# Patient Record
Sex: Female | Born: 1950 | Race: Black or African American | Hispanic: No | Marital: Married | Smoking: Never smoker
Health system: Southern US, Community
[De-identification: ages and names within clinical notes are randomized; demographics above are authoritative.]

## PROBLEM LIST (undated history)

## (undated) DIAGNOSIS — I10 Essential (primary) hypertension: Secondary | ICD-10-CM

## (undated) DIAGNOSIS — E785 Hyperlipidemia, unspecified: Secondary | ICD-10-CM

## (undated) DIAGNOSIS — M1990 Unspecified osteoarthritis, unspecified site: Secondary | ICD-10-CM

## (undated) HISTORY — DX: Essential (primary) hypertension: I10

## (undated) HISTORY — PX: COLONOSCOPY: SHX174

## (undated) HISTORY — DX: Hyperlipidemia, unspecified: E78.5

## (undated) HISTORY — DX: Unspecified osteoarthritis, unspecified site: M19.90

---
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
## 2000-02-22 ENCOUNTER — Encounter: Payer: Self-pay | Admitting: Family Medicine

## 2000-02-22 ENCOUNTER — Encounter: Admission: RE | Admit: 2000-02-22 | Discharge: 2000-02-22 | Payer: Self-pay | Admitting: Family Medicine

## 2001-03-04 ENCOUNTER — Encounter: Admission: RE | Admit: 2001-03-04 | Discharge: 2001-03-04 | Payer: Self-pay | Admitting: *Deleted

## 2002-03-13 ENCOUNTER — Encounter: Admission: RE | Admit: 2002-03-13 | Discharge: 2002-03-13 | Payer: Self-pay | Admitting: *Deleted

## 2003-03-25 ENCOUNTER — Encounter: Admission: RE | Admit: 2003-03-25 | Discharge: 2003-03-25 | Payer: Self-pay | Admitting: Geriatric Medicine

## 2003-05-12 LAB — HM COLONOSCOPY

## 2004-03-28 ENCOUNTER — Encounter: Admission: RE | Admit: 2004-03-28 | Discharge: 2004-03-28 | Payer: Self-pay | Admitting: Family Medicine

## 2004-08-10 ENCOUNTER — Ambulatory Visit: Payer: Self-pay | Admitting: Family Medicine

## 2004-11-28 ENCOUNTER — Ambulatory Visit: Payer: Self-pay | Admitting: Family Medicine

## 2005-03-01 ENCOUNTER — Ambulatory Visit: Payer: Self-pay | Admitting: Family Medicine

## 2005-04-03 ENCOUNTER — Encounter: Admission: RE | Admit: 2005-04-03 | Discharge: 2005-04-03 | Payer: Self-pay | Admitting: Family Medicine

## 2005-07-31 ENCOUNTER — Ambulatory Visit: Payer: Self-pay | Admitting: Family Medicine

## 2005-08-07 ENCOUNTER — Ambulatory Visit: Payer: Self-pay | Admitting: Family Medicine

## 2006-04-04 ENCOUNTER — Encounter: Admission: RE | Admit: 2006-04-04 | Discharge: 2006-04-04 | Payer: Self-pay | Admitting: *Deleted

## 2006-10-30 ENCOUNTER — Ambulatory Visit: Payer: Self-pay | Admitting: Family Medicine

## 2006-10-30 LAB — CONVERTED CEMR LAB
ALT: 26 units/L (ref 0–40)
Albumin: 3.9 g/dL (ref 3.5–5.2)
Alkaline Phosphatase: 46 units/L (ref 39–117)
BUN: 9 mg/dL (ref 6–23)
Basophils Absolute: 0 10*3/uL (ref 0.0–0.1)
Basophils Relative: 0.2 % (ref 0.0–1.0)
Bilirubin, Direct: 0.1 mg/dL (ref 0.0–0.3)
Calcium: 9.9 mg/dL (ref 8.4–10.5)
Chloride: 99 meq/L (ref 96–112)
Cholesterol: 180 mg/dL (ref 0–200)
Creatinine, Ser: 0.7 mg/dL (ref 0.4–1.2)
HDL: 42.8 mg/dL (ref >39.0)
Hgb A1c MFr Bld: 5.9 % (ref 4.6–6.0)
LDL Cholesterol: 111 mg/dL — ABNORMAL HIGH (ref 0–99)
MCHC: 33.8 g/dL (ref 30.0–36.0)
Monocytes Relative: 14.3 % — ABNORMAL HIGH (ref 3.0–11.0)
RBC: 4.78 M/uL (ref 3.87–5.11)
RDW: 14.5 % (ref 11.5–14.6)
Total CHOL/HDL Ratio: 4.2
Total Protein: 7.4 g/dL (ref 6.0–8.3)
VLDL: 26 mg/dL (ref 0–40)

## 2006-11-06 ENCOUNTER — Encounter: Payer: Self-pay | Admitting: Family Medicine

## 2006-11-06 ENCOUNTER — Ambulatory Visit: Payer: Self-pay | Admitting: Family Medicine

## 2006-11-06 ENCOUNTER — Other Ambulatory Visit: Admission: RE | Admit: 2006-11-06 | Discharge: 2006-11-06 | Payer: Self-pay | Admitting: Family Medicine

## 2006-11-06 LAB — CONVERTED CEMR LAB: Pap Smear: NORMAL

## 2007-04-08 ENCOUNTER — Encounter: Admission: RE | Admit: 2007-04-08 | Discharge: 2007-04-08 | Payer: Self-pay | Admitting: Family Medicine

## 2007-11-05 ENCOUNTER — Ambulatory Visit: Payer: Self-pay | Admitting: Family Medicine

## 2007-11-05 LAB — CONVERTED CEMR LAB
Bilirubin Urine: NEGATIVE
Glucose, Urine, Semiquant: NEGATIVE
Specific Gravity, Urine: 1.015
Urobilinogen, UA: 0.2
pH: 5

## 2007-11-11 ENCOUNTER — Ambulatory Visit: Payer: Self-pay | Admitting: Family Medicine

## 2007-11-11 ENCOUNTER — Other Ambulatory Visit: Admission: RE | Admit: 2007-11-11 | Discharge: 2007-11-11 | Payer: Self-pay | Admitting: Family Medicine

## 2007-11-11 ENCOUNTER — Encounter: Payer: Self-pay | Admitting: Family Medicine

## 2007-11-11 DIAGNOSIS — M7150 Other bursitis, not elsewhere classified, unspecified site: Secondary | ICD-10-CM

## 2007-11-11 LAB — CONVERTED CEMR LAB
ALT: 23 units/L (ref 0–35)
Albumin: 3.9 g/dL (ref 3.5–5.2)
BUN: 12 mg/dL (ref 6–23)
Basophils Relative: 0.3 % (ref 0.0–1.0)
Bilirubin, Direct: 0.1 mg/dL (ref 0.0–0.3)
CO2: 30 meq/L (ref 19–32)
Calcium: 9.8 mg/dL (ref 8.4–10.5)
Cholesterol: 205 mg/dL (ref 0–200)
Creatinine, Ser: 0.7 mg/dL (ref 0.4–1.2)
Direct LDL: 140.5 mg/dL
GFR calc Af Amer: 112 mL/min
Glucose, Bld: 101 mg/dL — ABNORMAL HIGH (ref 70–99)
HCT: 37.7 % (ref 36.0–46.0)
Hemoglobin: 12.1 g/dL (ref 12.0–15.0)
Lymphocytes Relative: 39.1 % (ref 12.0–46.0)
Monocytes Absolute: 0.7 10*3/uL (ref 0.1–1.0)
Monocytes Relative: 12.8 % — ABNORMAL HIGH (ref 3.0–12.0)
Neutro Abs: 2.4 10*3/uL (ref 1.4–7.7)
RBC: 4.47 M/uL (ref 3.87–5.11)
RDW: 13.7 % (ref 11.5–14.6)
Sodium: 139 meq/L (ref 135–145)
TSH: 2.09 microintl units/mL (ref 0.35–5.50)
Total CHOL/HDL Ratio: 4.8
Total Protein: 6.8 g/dL (ref 6.0–8.3)

## 2007-11-19 ENCOUNTER — Encounter: Payer: Self-pay | Admitting: Family Medicine

## 2008-04-08 ENCOUNTER — Encounter: Admission: RE | Admit: 2008-04-08 | Discharge: 2008-04-08 | Payer: Self-pay | Admitting: Family Medicine

## 2008-11-17 ENCOUNTER — Ambulatory Visit: Payer: Self-pay | Admitting: Family Medicine

## 2008-11-24 ENCOUNTER — Ambulatory Visit: Payer: Self-pay | Admitting: Family Medicine

## 2008-11-24 DIAGNOSIS — N390 Urinary tract infection, site not specified: Secondary | ICD-10-CM | POA: Insufficient documentation

## 2008-11-26 LAB — CONVERTED CEMR LAB
AST: 25 units/L (ref 0–37)
Albumin: 4.2 g/dL (ref 3.5–5.2)
Alkaline Phosphatase: 63 units/L (ref 39–117)
Basophils Relative: 0.1 % (ref 0.0–3.0)
CO2: 32 meq/L (ref 19–32)
Calcium: 9.6 mg/dL (ref 8.4–10.5)
GFR calc non Af Amer: 95.48 mL/min (ref >60)
Glucose, Bld: 92 mg/dL (ref 70–99)
HCT: 37.1 % (ref 36.0–46.0)
Hemoglobin: 12.4 g/dL (ref 12.0–15.0)
Lymphocytes Relative: 38.7 % (ref 12.0–46.0)
Lymphs Abs: 1.7 10*3/uL (ref 0.7–4.0)
MCHC: 33.3 g/dL (ref 30.0–36.0)
Monocytes Relative: 10.6 % (ref 3.0–12.0)
Neutro Abs: 2.3 10*3/uL (ref 1.4–7.7)
Potassium: 4 meq/L (ref 3.5–5.1)
RBC: 4.43 M/uL (ref 3.87–5.11)
Sodium: 141 meq/L (ref 135–145)
TSH: 1.79 microintl units/mL (ref 0.35–5.50)
Total CHOL/HDL Ratio: 5
Total Protein: 7.5 g/dL (ref 6.0–8.3)
Triglycerides: 89 mg/dL (ref 0.0–149.0)

## 2008-11-27 LAB — CONVERTED CEMR LAB
Ketones, urine, test strip: NEGATIVE
Nitrite: NEGATIVE
Specific Gravity, Urine: <1.005
Urobilinogen, UA: 0.2

## 2008-12-06 ENCOUNTER — Ambulatory Visit: Payer: Self-pay | Admitting: Family Medicine

## 2008-12-23 ENCOUNTER — Ambulatory Visit: Payer: Self-pay | Admitting: Family Medicine

## 2008-12-23 LAB — CONVERTED CEMR LAB
Bilirubin Urine: NEGATIVE
Blood in Urine, dipstick: NEGATIVE
Glucose, Urine, Semiquant: NEGATIVE
Ketones, urine, test strip: NEGATIVE
Protein, U semiquant: NEGATIVE
pH: 8.5

## 2009-01-14 ENCOUNTER — Ambulatory Visit: Payer: Self-pay | Admitting: Family Medicine

## 2009-01-14 LAB — CONVERTED CEMR LAB
OCCULT 1: NEGATIVE
OCCULT 2: NEGATIVE
OCCULT 3: NEGATIVE

## 2009-01-19 ENCOUNTER — Encounter: Payer: Self-pay | Admitting: Family Medicine

## 2009-04-12 ENCOUNTER — Encounter: Admission: RE | Admit: 2009-04-12 | Discharge: 2009-04-12 | Payer: Self-pay | Admitting: Family Medicine

## 2009-11-22 ENCOUNTER — Ambulatory Visit: Payer: Self-pay | Admitting: Family Medicine

## 2009-11-29 ENCOUNTER — Other Ambulatory Visit: Admission: RE | Admit: 2009-11-29 | Discharge: 2009-11-29 | Payer: Self-pay | Admitting: Family Medicine

## 2009-11-29 ENCOUNTER — Ambulatory Visit: Payer: Self-pay | Admitting: Family Medicine

## 2009-11-29 DIAGNOSIS — N952 Postmenopausal atrophic vaginitis: Secondary | ICD-10-CM

## 2009-11-29 DIAGNOSIS — M19019 Primary osteoarthritis, unspecified shoulder: Secondary | ICD-10-CM | POA: Insufficient documentation

## 2009-11-29 DIAGNOSIS — M7710 Lateral epicondylitis, unspecified elbow: Secondary | ICD-10-CM | POA: Insufficient documentation

## 2009-11-29 LAB — CONVERTED CEMR LAB
ALT: 21 units/L (ref 0–35)
AST: 25 units/L (ref 0–37)
Albumin: 4.2 g/dL (ref 3.5–5.2)
Eosinophils Relative: 0.8 % (ref 0.0–5.0)
GFR calc non Af Amer: 95.14 mL/min (ref >60)
Glucose, Bld: 96 mg/dL (ref 70–99)
Glucose, Urine, Semiquant: NEGATIVE
HCT: 36.7 % (ref 36.0–46.0)
Hemoglobin: 12.2 g/dL (ref 12.0–15.0)
Lymphs Abs: 1.8 10*3/uL (ref 0.7–4.0)
Monocytes Relative: 11.6 % (ref 3.0–12.0)
Neutro Abs: 2.1 10*3/uL (ref 1.4–7.7)
Potassium: 4.2 meq/L (ref 3.5–5.1)
Protein, U semiquant: NEGATIVE
RDW: 14.8 % — ABNORMAL HIGH (ref 11.5–14.6)
Sodium: 143 meq/L (ref 135–145)
Specific Gravity, Urine: <1.005
TSH: 2.09 microintl units/mL (ref 0.35–5.50)
Total Protein: 7.7 g/dL (ref 6.0–8.3)
VLDL: 20.6 mg/dL (ref 0.0–40.0)
pH: 6.5

## 2009-11-29 LAB — HM PAP SMEAR

## 2009-12-06 ENCOUNTER — Encounter: Payer: Self-pay | Admitting: Family Medicine

## 2009-12-07 ENCOUNTER — Ambulatory Visit: Payer: Self-pay | Admitting: Family Medicine

## 2009-12-22 ENCOUNTER — Encounter: Payer: Self-pay | Admitting: Family Medicine

## 2010-04-13 ENCOUNTER — Encounter: Admission: RE | Admit: 2010-04-13 | Discharge: 2010-04-13 | Payer: Self-pay | Admitting: Family Medicine

## 2010-09-05 NOTE — Letter (Signed)
Summary: Results Follow-up Letter   at [facility]  [address] [place], [place] [address]   Phone: [phone]  Fax: [phone]    12/06/2009  [address] [place], [place]  [address]  Dear [name],   The following are the results of your recent test(s):  Test     Result     Pap Smear    Normal__yes _____     Sincerely,  Dr. Verdie Drown, MD

## 2010-09-05 NOTE — Assessment & Plan Note (Signed)
Summary: cpx/pap/njr   Vital Signs:  Patient profile:   60 year old female Weight:      174 pounds BMI:     28.19 O2 Sat:      98 % Temp:     98.1 degrees F Pulse rate:   95 / minute BP sitting:   144 / 80  (left arm)  Vitals Entered By: Pura Spice, RN (November 29, 2009 10:55 AM)  Contraindications/Deferment of Procedures/Staging:    Test/Procedure: TD vaccine    Reason for deferment: declined  CC: cpx wiht PAP  Is Patient Diabetic? No   History of Present Illness: This 60 year old Afro-American female who is in for a complete physical examination and relates he has been doing her well she exercises on a regular basis and attempts to keep her weight under control with a healthy diet she is on no medications except for 81 mg aspirin. She is lost 6 pounds in the past 6 months She continues to have problems with her right shoulder which I have injected in the past thinking she had subdeltoid bursitis but her problem persist and I am going to refer her to an orthopedist She also has some problem with lateral epicondylitis of the left elbow which we were way too weight lifting and since it is not severe it is time to avoid the exercises that cause discomfort     EKG  Procedure date:  11/29/2009  Findings:      sinus rhythm with rate of:   71  Allergies (verified): No Known Drug Allergies  Past History:  Past Medical History: Last updated: 11/11/2007 Unremarkable  Past Surgical History: Last updated: 11/11/2007 Hysterectomy  Social History: Last updated: 11/24/2008 Married Never Smoked  Risk Factors: Smoking Status: never (11/24/2008)  Review of Systems      See HPI  The patient denies anorexia, fever, weight loss, weight gain, vision loss, decreased hearing, hoarseness, chest pain, syncope, dyspnea on exertion, peripheral edema, prolonged cough, headaches, hemoptysis, abdominal pain, melena, hematochezia, severe indigestion/heartburn, hematuria, incontinence,  genital sores, muscle weakness, suspicious skin lesions, transient blindness, difficulty walking, depression, unusual weight change, abnormal bleeding, enlarged lymph nodes, angioedema, breast masses, and testicular masses.    Physical Exam  General:  Well-developed,well-nourished,in no acute distress; alert,appropriate and cooperative throughout examination Head:  Normocephalic and atraumatic without obvious abnormalities. No apparent alopecia or balding. Eyes:  No corneal or conjunctival inflammation noted. EOMI. Perrla. Funduscopic exam benign, without hemorrhages, exudates or papilledema. Vision grossly normal. Ears:  External ear exam shows no significant lesions or deformities.  Otoscopic examination reveals clear canals, tympanic membranes are intact bilaterally without bulging, retraction, inflammation or discharge. Hearing is grossly normal bilaterally. Nose:  External nasal examination shows no deformity or inflammation. Nasal mucosa are pink and moist without lesions or exudates. Mouth:  Oral mucosa and oropharynx without lesions or exudates.  Teeth in good repair. Neck:  No deformities, masses, or tenderness noted. Chest Wall:  No deformities, masses, or tenderness noted. Breasts:  No mass, nodules, thickening, tenderness, bulging, retraction, inflamation, nipple discharge or skin changes noted.   Lungs:  Normal respiratory effort, chest expands symmetrically. Lungs are clear to auscultation, no crackles or wheezes. Heart:  Normal rate and regular rhythm. S1 and S2 normal without gallop, murmur, click, rub or other extra sounds. Abdomen:  Bowel sounds positive,abdomen soft and non-tender without masses, organomegaly or hernias noted. Rectal:  No external abnormalities noted. Normal sphincter tone. No rectal masses or tenderness. Genitalia:  absent uterus  atrophic nasal mucosa, dry Labs rebound Msk:  pain on movement right shoulder tenderness over the subdeltoid bursa pain on abduction  and abduction and hyperextension minimal tenderness over the left lateral elbow or lateral epicondylitis present Pulses:  R and L carotid,radial,femoral,dorsalis pedis and posterior tibial pulses are full and equal bilaterally Extremities:  No clubbing, cyanosis, edema, or deformity noted with normal full range of motion of all joints.   Neurologic:  No cranial nerve deficits noted. Station and gait are normal. Plantar reflexes are down-going bilaterally. DTRs are symmetrical throughout. Sensory, motor and coordinative functions appear intact.   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 1 once daily Her updated medication list for this problem includes:    Diclofenac Sodium 75 Mg Tbec (Diclofenac sodium) .Marland Kitchen... 1 two times a day pc for shoulder and elbow  Orders: Orthopedic Surgeon Referral (Ortho Surgeon)  Complete Medication List: 1)  Diclofenac Sodium 75 Mg Tbec (Diclofenac sodium) .Marland Kitchen.. 1 two times a day pc for shoulder and elbow  Patient Instructions: 1)  healthy female with arthritis her subdeltoid bursitis of the right shoulder and lateral epicondylitis of the left elbow. 2)  He also have atrophic vaginal mucosa which can be treated if you desire 3)  [facility] the prescription to 4 diclofenac 75 mg twice daily for shoulder and elbow 4)  Also to make an appointment with an orthopedist Prescriptions: DICLOFENAC SODIUM 75 MG TBEC (DICLOFENAC SODIUM) 1 two  times a day pc for shoulder and elbow  #60 x 11   Entered and Authorized by:   Judithann Sheen MD   Signed by:   Judithann Sheen MD on 11/29/2009   Method used:   Print then Give to Patient   RxID:   [id number]

## 2010-09-05 NOTE — Letter (Signed)
Summary: Generic Letter  [place] [facility]  [address] [place], [place] [address]   Phone: [phone]  Fax: [phone]    12/22/2009  Kiara Holt [address] [place], [place]  [address]  Dear Ms. [name],   Hemocult cards were all negative.         Sincerely,   Dr Gwenyth Bender Stafford,MD

## 2010-12-05 ENCOUNTER — Other Ambulatory Visit (INDEPENDENT_AMBULATORY_CARE_PROVIDER_SITE_OTHER): Payer: 59 | Admitting: Family Medicine

## 2010-12-05 DIAGNOSIS — Z0000 Encounter for general adult medical examination without abnormal findings: Secondary | ICD-10-CM

## 2010-12-05 LAB — LIPID PANEL
Cholesterol: 185 mg/dL (ref 0–200)
HDL: 42.8 mg/dL (ref >39.00)
Total CHOL/HDL Ratio: 4
Triglycerides: 112 mg/dL (ref 0.0–149.0)

## 2010-12-05 LAB — BASIC METABOLIC PANEL
BUN: 7 mg/dL (ref 6–23)
CO2: 32 mEq/L (ref 19–32)
Chloride: 101 mEq/L (ref 96–112)
Creatinine, Ser: 0.8 mg/dL (ref 0.4–1.2)

## 2010-12-05 LAB — POCT URINALYSIS DIPSTICK
Bilirubin, UA: NEGATIVE
Nitrite, UA: NEGATIVE
Spec Grav, UA: 1.015
pH, UA: 7

## 2010-12-05 LAB — HEPATIC FUNCTION PANEL
Bilirubin, Direct: 0.1 mg/dL (ref 0.0–0.3)
Total Bilirubin: 0.6 mg/dL (ref 0.3–1.2)
Total Protein: 6.7 g/dL (ref 6.0–8.3)

## 2010-12-05 LAB — CBC WITH DIFFERENTIAL/PLATELET
Basophils Relative: 0.4 % (ref 0.0–3.0)
Eosinophils Absolute: 0 10*3/uL (ref 0.0–0.7)
MCHC: 32.8 g/dL (ref 30.0–36.0)
MCV: 84.8 fl (ref 78.0–100.0)
Monocytes Absolute: 0.6 10*3/uL (ref 0.1–1.0)
Neutrophils Relative %: 53.1 % (ref 43.0–77.0)
Platelets: 182 10*3/uL (ref 150.0–400.0)

## 2010-12-14 ENCOUNTER — Ambulatory Visit (INDEPENDENT_AMBULATORY_CARE_PROVIDER_SITE_OTHER): Payer: 59 | Admitting: Family Medicine

## 2010-12-14 ENCOUNTER — Other Ambulatory Visit (HOSPITAL_COMMUNITY)
Admission: RE | Admit: 2010-12-14 | Discharge: 2010-12-14 | Disposition: A | Discharge status: Home / Self Care | Payer: 59 | Source: Ambulatory Visit | Attending: Family Medicine | Admitting: Family Medicine

## 2010-12-14 ENCOUNTER — Encounter: Payer: Self-pay | Admitting: Family Medicine

## 2010-12-14 VITALS — BP 118/78 | HR 100 | Temp 98.3°F | Ht 66.0 in | Wt 175.0 lb

## 2010-12-14 DIAGNOSIS — M19011 Primary osteoarthritis, right shoulder: Secondary | ICD-10-CM

## 2010-12-14 DIAGNOSIS — Z01419 Encounter for gynecological examination (general) (routine) without abnormal findings: Secondary | ICD-10-CM | POA: Insufficient documentation

## 2010-12-14 DIAGNOSIS — M19019 Primary osteoarthritis, unspecified shoulder: Secondary | ICD-10-CM

## 2010-12-14 DIAGNOSIS — Z0000 Encounter for general adult medical examination without abnormal findings: Secondary | ICD-10-CM

## 2010-12-14 NOTE — Patient Instructions (Addendum)
You are doing great, continue Aleve for your shoulder Continue to exercise You are a healthy lady Reviewed your lab studies and are normal

## 2011-01-03 NOTE — Progress Notes (Signed)
Quick Note:  Pt is aware. ______ 

## 2011-01-08 NOTE — Progress Notes (Signed)
  Subjective:    Patient ID: Kiara Holt, female    DOB: 1950-09-26, 60 y.o.   MRN: [id number] 60 year old black married female was in for a yearly physical examination he relates she has no complaints. She continues to have occasional pain in the right shoulder region but not sufficient to take medications she does not take any medications at this time she exercises on a regular basis no sexual activity and no other complaintsHPI    Review of Systems  Constitutional: Negative.   HENT: Negative.   Eyes: Negative.   Respiratory: Negative.   Cardiovascular: Negative.   Genitourinary: Negative.   Musculoskeletal: Negative.   Skin: Negative.   Neurological: Negative.   Hematological: Negative.   Psychiatric/Behavioral: Negative.        Objective:   Physical Exam The patient is a pleasant black female who is in no distress cooperative and pleasant HEENT negative carotid pulses good thyroid is normal Lungs clear to palpation percussion auscultation no rales no dullness no wheezing Heart no cardiomegaly heart sounds are good without murmurs peripheral pulses good and equal bilaterally regular cardiac rhythm Breasts full firm no masses no tenderness nipples normal axilla clear no lymphadenopathy Abdomen liver spleen kidneys nonpalpable no masses felt bowel sounds normal Pelvic examination external introitus normal but tight non elastic vaginal mucosa slightly dry uterus absent Rectal examination negative adnexal areas negative Extremities negative examination right shoulder negative Skin no lesions present Neurological examination normal no abnormalities reflexes normal equal bilaterally       Assessment & Plan:  Patient is a healthy female who needs no medications, is there active physically in good emotional Reviewed lab studies and are very good

## 2011-03-14 ENCOUNTER — Other Ambulatory Visit: Payer: Self-pay | Admitting: Family Medicine

## 2011-03-14 DIAGNOSIS — Z1231 Encounter for screening mammogram for malignant neoplasm of breast: Secondary | ICD-10-CM

## 2011-04-16 ENCOUNTER — Ambulatory Visit
Admission: RE | Admit: 2011-04-16 | Discharge: 2011-04-16 | Disposition: A | Discharge status: Home / Self Care | Payer: 59 | Source: Ambulatory Visit | Attending: Family Medicine | Admitting: Family Medicine

## 2011-04-16 DIAGNOSIS — Z1231 Encounter for screening mammogram for malignant neoplasm of breast: Secondary | ICD-10-CM

## 2011-08-24 ENCOUNTER — Other Ambulatory Visit (INDEPENDENT_AMBULATORY_CARE_PROVIDER_SITE_OTHER): Payer: Self-pay | Admitting: General Surgery

## 2011-10-31 ENCOUNTER — Encounter: Payer: Self-pay | Admitting: Internal Medicine

## 2011-10-31 ENCOUNTER — Ambulatory Visit (INDEPENDENT_AMBULATORY_CARE_PROVIDER_SITE_OTHER): Payer: 59 | Admitting: Internal Medicine

## 2011-10-31 VITALS — BP 148/82 | HR 92 | Temp 98.2°F | Wt 164.0 lb

## 2011-10-31 DIAGNOSIS — Z0000 Encounter for general adult medical examination without abnormal findings: Secondary | ICD-10-CM

## 2011-10-31 DIAGNOSIS — R7309 Other abnormal glucose: Secondary | ICD-10-CM

## 2011-10-31 DIAGNOSIS — R7303 Prediabetes: Secondary | ICD-10-CM | POA: Insufficient documentation

## 2011-10-31 NOTE — Patient Instructions (Signed)
Preventive Care for Adults, Female A healthy lifestyle and preventive care can promote health and wellness. Preventive health guidelines for women include the following key practices.  A routine yearly physical is a good way to check with your caregiver about your health and preventive screening. It is a chance to share any concerns and updates on your health, and to receive a thorough exam.   Visit your dentist for a routine exam and preventive care every 6 months. Brush your teeth twice a day and floss once a day. Good oral hygiene prevents tooth decay and gum disease.   The frequency of eye exams is based on your age, health, family medical history, use of contact lenses, and other factors. Follow your caregiver's recommendations for frequency of eye exams.   Eat a healthy diet. Foods like vegetables, fruits, whole grains, low-fat dairy products, and lean protein foods contain the nutrients you need without too many calories. Decrease your intake of foods high in solid fats, added sugars, and salt. Eat the right amount of calories for you.Get information about a proper diet from your caregiver, if necessary.   Regular physical exercise is one of the most important things you can do for your health. Most adults should get at least 150 minutes of moderate-intensity exercise (any activity that increases your heart rate and causes you to sweat) each week. In addition, most adults need muscle-strengthening exercises on 2 or more days a week.   Maintain a healthy weight. The body mass index (BMI) is a screening tool to identify possible weight problems. It provides an estimate of body fat based on height and weight. Your caregiver can help determine your BMI, and can help you achieve or maintain a healthy weight.For adults 20 years and older:   A BMI below 18.5 is considered underweight.   A BMI of 18.5 to 24.9 is normal.   A BMI of 25 to 29.9 is considered overweight.   A BMI of 30 and above is  considered obese.   Maintain normal blood lipids and cholesterol levels by exercising and minimizing your intake of saturated fat. Eat a balanced diet with plenty of fruit and vegetables. Blood tests for lipids and cholesterol should begin at age 20 and be repeated every 5 years. If your lipid or cholesterol levels are high, you are over 50, or you are at high risk for heart disease, you may need your cholesterol levels checked more frequently.Ongoing high lipid and cholesterol levels should be treated with medicines if diet and exercise are not effective.   If you smoke, find out from your caregiver how to quit. If you do not use tobacco, do not start.   If you are pregnant, do not drink alcohol. If you are breastfeeding, be very cautious about drinking alcohol. If you are not pregnant and choose to drink alcohol, do not exceed 1 drink per day. One drink is considered to be 12 ounces (355 mL) of beer, 5 ounces (148 mL) of wine, or 1.5 ounces (44 mL) of liquor.   Avoid use of street drugs. Do not share needles with anyone. Ask for help if you need support or instructions about stopping the use of drugs.   High blood pressure causes heart disease and increases the risk of stroke. Your blood pressure should be checked at least every 1 to 2 years. Ongoing high blood pressure should be treated with medicines if weight loss and exercise are not effective.   If you are 55 to 59   years old, ask your caregiver if you should take aspirin to prevent strokes.   Diabetes screening involves taking a blood sample to check your fasting blood sugar level. This should be done once every 3 years, after age 45, if you are within normal weight and without risk factors for diabetes. Testing should be considered at a younger age or be carried out more frequently if you are overweight and have at least 1 risk factor for diabetes.   Breast cancer screening is essential preventive care for women. You should practice "breast  self-awareness." This means understanding the normal appearance and feel of your breasts and may include breast self-examination. Any changes detected, no matter how small, should be reported to a caregiver. Women in their 20s and 30s should have a clinical breast exam (CBE) by a caregiver as part of a regular health exam every 1 to 3 years. After age 40, women should have a CBE every year. Starting at age 40, women should consider having a mammography (breast X-ray test) every year. Women who have a family history of breast cancer should talk to their caregiver about genetic screening. Women at a high risk of breast cancer should talk to their caregivers about having magnetic resonance imaging (MRI) and a mammography every year.   The Pap test is a screening test for cervical cancer. A Pap test can show cell changes on the cervix that might become cervical cancer if left untreated. A Pap test is a procedure in which cells are obtained and examined from the lower end of the uterus (cervix).   Women should have a Pap test starting at age 21.   Between ages 21 and 29, Pap tests should be repeated every 2 years.   Beginning at age 30, you should have a Pap test every 3 years as long as the past 3 Pap tests have been normal.   Some women have medical problems that increase the chance of getting cervical cancer. Talk to your caregiver about these problems. It is especially important to talk to your caregiver if a new problem develops soon after your last Pap test. In these cases, your caregiver may recommend more frequent screening and Pap tests.   The above recommendations are the same for women who have or have not gotten the vaccine for human papillomavirus (HPV).   If you had a hysterectomy for a problem that was not cancer or a condition that could lead to cancer, then you no longer need Pap tests. Even if you no longer need a Pap test, a regular exam is a good idea to make sure no other problems are  starting.   If you are between ages 65 and 70, and you have had normal Pap tests going back 10 years, you no longer need Pap tests. Even if you no longer need a Pap test, a regular exam is a good idea to make sure no other problems are starting.   If you have had past treatment for cervical cancer or a condition that could lead to cancer, you need Pap tests and screening for cancer for at least 20 years after your treatment.   If Pap tests have been discontinued, risk factors (such as a new sexual partner) need to be reassessed to determine if screening should be resumed.   The HPV test is an additional test that may be used for cervical cancer screening. The HPV test looks for the virus that can cause the cell changes on the cervix.   The cells collected during the Pap test can be tested for HPV. The HPV test could be used to screen women aged 30 years and older, and should be used in women of any age who have unclear Pap test results. After the age of 30, women should have HPV testing at the same frequency as a Pap test.   Colorectal cancer can be detected and often prevented. Most routine colorectal cancer screening begins at the age of 50 and continues through age 75. However, your caregiver may recommend screening at an earlier age if you have risk factors for colon cancer. On a yearly basis, your caregiver may provide home test kits to check for hidden blood in the stool. Use of a small camera at the end of a tube, to directly examine the colon (sigmoidoscopy or colonoscopy), can detect the earliest forms of colorectal cancer. Talk to your caregiver about this at age 50, when routine screening begins. Direct examination of the colon should be repeated every 5 to 10 years through age 75, unless early forms of pre-cancerous polyps or small growths are found.   Hepatitis C blood testing is recommended for all people born from 1945 through 1965 and any individual with known risks for hepatitis C.    Practice safe sex. Use condoms and avoid high-risk sexual practices to reduce the spread of sexually transmitted infections (STIs). STIs include gonorrhea, chlamydia, syphilis, trichomonas, herpes, HPV, and human immunodeficiency virus (HIV). Herpes, HIV, and HPV are viral illnesses that have no cure. They can result in disability, cancer, and death. Sexually active women aged 25 and younger should be checked for chlamydia. Older women with new or multiple partners should also be tested for chlamydia. Testing for other STIs is recommended if you are sexually active and at increased risk.   Osteoporosis is a disease in which the bones lose minerals and strength with aging. This can result in serious bone fractures. The risk of osteoporosis can be identified using a bone density scan. Women ages 65 and over and women at risk for fractures or osteoporosis should discuss screening with their caregivers. Ask your caregiver whether you should take a calcium supplement or vitamin D to reduce the rate of osteoporosis.   Menopause can be associated with physical symptoms and risks. Hormone replacement therapy is available to decrease symptoms and risks. You should talk to your caregiver about whether hormone replacement therapy is right for you.   Use sunscreen with sun protection factor (SPF) of 30 or more. Apply sunscreen liberally and repeatedly throughout the day. You should seek shade when your shadow is shorter than you. Protect yourself by wearing long sleeves, pants, a wide-brimmed hat, and sunglasses year round, whenever you are outdoors.   Once a month, do a whole body skin exam, using a mirror to look at the skin on your back. Notify your caregiver of new moles, moles that have irregular borders, moles that are larger than a pencil eraser, or moles that have changed in shape or color.   Stay current with required immunizations.   Influenza. You need a dose every fall (or winter). The composition of  the flu vaccine changes each year, so being vaccinated once is not enough.   Pneumococcal polysaccharide. You need 1 to 2 doses if you smoke cigarettes or if you have certain chronic medical conditions. You need 1 dose at age 65 (or older) if you have never been vaccinated.   Tetanus, diphtheria, pertussis (Tdap, Td). Get 1 dose of   Tdap vaccine if you are younger than age 65, are over 65 and have contact with an infant, are a healthcare worker, are pregnant, or simply want to be protected from whooping cough. After that, you need a Td booster dose every 10 years. Consult your caregiver if you have not had at least 3 tetanus and diphtheria-containing shots sometime in your life or have a deep or dirty wound.   HPV. You need this vaccine if you are a woman age 26 or younger. The vaccine is given in 3 doses over 6 months.   Measles, mumps, rubella (MMR). You need at least 1 dose of MMR if you were born in 1957 or later. You may also need a second dose.   Meningococcal. If you are age 19 to 21 and a first-year college student living in a residence hall, or have one of several medical conditions, you need to get vaccinated against meningococcal disease. You may also need additional booster doses.   Zoster (shingles). If you are age 60 or older, you should get this vaccine.   Varicella (chickenpox). If you have never had chickenpox or you were vaccinated but received only 1 dose, talk to your caregiver to find out if you need this vaccine.   Hepatitis A. You need this vaccine if you have a specific risk factor for hepatitis A virus infection or you simply wish to be protected from this disease. The vaccine is usually given as 2 doses, 6 to 18 months apart.   Hepatitis B. You need this vaccine if you have a specific risk factor for hepatitis B virus infection or you simply wish to be protected from this disease. The vaccine is given in 3 doses, usually over 6 months.  Preventive Services /  Frequency Ages 19 to 39  Blood pressure check.** / Every 1 to 2 years.   Lipid and cholesterol check.** / Every 5 years beginning at age 20.   Clinical breast exam.** / Every 3 years for women in their 20s and 30s.   Pap test.** / Every 2 years from ages 21 through 29. Every 3 years starting at age 30 through age 65 or 70 with a history of 3 consecutive normal Pap tests.   HPV screening.** / Every 3 years from ages 30 through ages 65 to 70 with a history of 3 consecutive normal Pap tests.   Hepatitis C blood test.** / For any individual with known risks for hepatitis C.   Skin self-exam. / Monthly.   Influenza immunization.** / Every year.   Pneumococcal polysaccharide immunization.** / 1 to 2 doses if you smoke cigarettes or if you have certain chronic medical conditions.   Tetanus, diphtheria, pertussis (Tdap, Td) immunization. / A one-time dose of Tdap vaccine. After that, you need a Td booster dose every 10 years.   HPV immunization. / 3 doses over 6 months, if you are 26 and younger.   Measles, mumps, rubella (MMR) immunization. / You need at least 1 dose of MMR if you were born in 1957 or later. You may also need a second dose.   Meningococcal immunization. / 1 dose if you are age 19 to 21 and a first-year college student living in a residence hall, or have one of several medical conditions, you need to get vaccinated against meningococcal disease. You may also need additional booster doses.   Varicella immunization.** / Consult your caregiver.   Hepatitis A immunization.** / Consult your caregiver. 2 doses, 6 to 18 months   apart.   Hepatitis B immunization.** / Consult your caregiver. 3 doses usually over 6 months.  Ages 40 to 64  Blood pressure check.** / Every 1 to 2 years.   Lipid and cholesterol check.** / Every 5 years beginning at age 20.   Clinical breast exam.** / Every year after age 40.   Mammogram.** / Every year beginning at age 40 and continuing for as  long as you are in good health. Consult with your caregiver.   Pap test.** / Every 3 years starting at age 30 through age 65 or 70 with a history of 3 consecutive normal Pap tests.   HPV screening.** / Every 3 years from ages 30 through ages 65 to 70 with a history of 3 consecutive normal Pap tests.   Fecal occult blood test (FOBT) of stool. / Every year beginning at age 50 and continuing until age 75. You may not need to do this test if you get a colonoscopy every 10 years.   Flexible sigmoidoscopy or colonoscopy.** / Every 5 years for a flexible sigmoidoscopy or every 10 years for a colonoscopy beginning at age 50 and continuing until age 75.   Hepatitis C blood test.** / For all people born from 1945 through 1965 and any individual with known risks for hepatitis C.   Skin self-exam. / Monthly.   Influenza immunization.** / Every year.   Pneumococcal polysaccharide immunization.** / 1 to 2 doses if you smoke cigarettes or if you have certain chronic medical conditions.   Tetanus, diphtheria, pertussis (Tdap, Td) immunization.** / A one-time dose of Tdap vaccine. After that, you need a Td booster dose every 10 years.   Measles, mumps, rubella (MMR) immunization. / You need at least 1 dose of MMR if you were born in 1957 or later. You may also need a second dose.   Varicella immunization.** / Consult your caregiver.   Meningococcal immunization.** / Consult your caregiver.   Hepatitis A immunization.** / Consult your caregiver. 2 doses, 6 to 18 months apart.   Hepatitis B immunization.** / Consult your caregiver. 3 doses, usually over 6 months.  Ages 65 and over  Blood pressure check.** / Every 1 to 2 years.   Lipid and cholesterol check.** / Every 5 years beginning at age 20.   Clinical breast exam.** / Every year after age 40.   Mammogram.** / Every year beginning at age 40 and continuing for as long as you are in good health. Consult with your caregiver.   Pap test.** /  Every 3 years starting at age 30 through age 65 or 70 with a 3 consecutive normal Pap tests. Testing can be stopped between 65 and 70 with 3 consecutive normal Pap tests and no abnormal Pap or HPV tests in the past 10 years.   HPV screening.** / Every 3 years from ages 30 through ages 65 or 70 with a history of 3 consecutive normal Pap tests. Testing can be stopped between 65 and 70 with 3 consecutive normal Pap tests and no abnormal Pap or HPV tests in the past 10 years.   Fecal occult blood test (FOBT) of stool. / Every year beginning at age 50 and continuing until age 75. You may not need to do this test if you get a colonoscopy every 10 years.   Flexible sigmoidoscopy or colonoscopy.** / Every 5 years for a flexible sigmoidoscopy or every 10 years for a colonoscopy beginning at age 50 and continuing until age 75.   Hepatitis   C blood test.** / For all people born from 1945 through 1965 and any individual with known risks for hepatitis C.   Osteoporosis screening.** / A one-time screening for women ages 65 and over and women at risk for fractures or osteoporosis.   Skin self-exam. / Monthly.   Influenza immunization.** / Every year.   Pneumococcal polysaccharide immunization.** / 1 dose at age 65 (or older) if you have never been vaccinated.   Tetanus, diphtheria, pertussis (Tdap, Td) immunization. / A one-time dose of Tdap vaccine if you are over 65 and have contact with an infant, are a healthcare worker, or simply want to be protected from whooping cough. After that, you need a Td booster dose every 10 years.   Varicella immunization.** / Consult your caregiver.   Meningococcal immunization.** / Consult your caregiver.   Hepatitis A immunization.** / Consult your caregiver. 2 doses, 6 to 18 months apart.   Hepatitis B immunization.** / Check with your caregiver. 3 doses, usually over 6 months.  ** Family history and personal history of risk and conditions may change your caregiver's  recommendations. Document Released: 09/18/2001 Document Revised: 07/12/2011 Document Reviewed: 12/18/2010 [employer] Patient Information 2012 [employer]. 

## 2011-10-31 NOTE — Progress Notes (Signed)
  Subjective:    Patient ID: Kiara Holt, female    DOB: 25-Aug-1950, 61 y.o.   MRN: [id number]  HPI New to me she comes in today for a physical but she tells me that she sees a GYN for breast care and PAP smears. She feels well and offers no complaints.   Review of Systems  Constitutional: Negative for fever, chills, diaphoresis, activity change, appetite change, fatigue and unexpected weight change.  HENT: Negative.   Eyes: Negative.   Respiratory: Negative for cough, chest tightness, shortness of breath, wheezing and stridor.   Cardiovascular: Negative for chest pain, palpitations and leg swelling.  Gastrointestinal: Negative for nausea, abdominal pain, diarrhea, constipation, anal bleeding and rectal pain.  Genitourinary: Negative.   Musculoskeletal: Negative for myalgias, back pain, joint swelling, arthralgias and gait problem.  Skin: Negative for color change, pallor, rash and wound.  Neurological: Negative.   Hematological: Negative for adenopathy. Does not bruise/bleed easily.  Psychiatric/Behavioral: Negative.        Objective:   Physical Exam  Vitals reviewed. Constitutional: She is oriented to person, place, and time. She appears well-developed and well-nourished. No distress.  HENT:  Head: Atraumatic.  Nose: Nose normal.  Mouth/Throat: Oropharynx is clear and moist. No oropharyngeal exudate.  Eyes: Conjunctivae are normal. Right eye exhibits no discharge. Left eye exhibits no discharge. No scleral icterus.  Neck: Normal range of motion. Neck supple. No JVD present. No tracheal deviation present. No thyromegaly present.  Cardiovascular: Normal rate, regular rhythm, normal heart sounds and intact distal pulses.  Exam reveals no gallop and no friction rub.   No murmur heard. Pulmonary/Chest: Effort normal and breath sounds normal. No stridor. No respiratory distress. She has no wheezes. She has no rales. She exhibits no tenderness.  Abdominal: Soft. Bowel sounds are  normal. She exhibits no distension and no mass. There is no tenderness. There is no rebound and no guarding.  Musculoskeletal: Normal range of motion. She exhibits no edema and no tenderness.  Lymphadenopathy:    She has no cervical adenopathy.  Neurological: She is oriented to person, place, and time.  Skin: Skin is warm and dry. No rash noted. She is not diaphoretic. No erythema. No pallor.  Psychiatric: She has a normal mood and affect. Her behavior is normal. Judgment and thought content normal.      Lab Results  Component Value Date   WBC 4.8 12/05/2010   HGB 12.1 12/05/2010   HCT 36.7 12/05/2010   PLT 182.0 12/05/2010   GLUCOSE 95 12/05/2010   CHOL 185 12/05/2010   TRIG 112.0 12/05/2010   HDL 42.80 12/05/2010   LDLDIRECT 137.1 11/17/2008   LDLCALC 120* 12/05/2010   ALT 20 12/05/2010   AST 24 12/05/2010   NA 141 12/05/2010   K 4.6 12/05/2010   CL 101 12/05/2010   CREATININE 0.8 12/05/2010   BUN 7 12/05/2010   CO2 32 12/05/2010   TSH 3.85 12/05/2010   HGBA1C 5.9 10/30/2006      Assessment & Plan:

## 2011-11-04 ENCOUNTER — Encounter: Payer: Self-pay | Admitting: Internal Medicine

## 2011-11-04 NOTE — Assessment & Plan Note (Signed)
Exam done, labs ordered, she refused all vaccines, pt ed material was given

## 2011-11-04 NOTE — Assessment & Plan Note (Signed)
I will check her a1c today 

## 2011-11-09 ENCOUNTER — Encounter: Payer: Self-pay | Admitting: Internal Medicine

## 2011-11-09 ENCOUNTER — Other Ambulatory Visit (INDEPENDENT_AMBULATORY_CARE_PROVIDER_SITE_OTHER): Payer: 59

## 2011-11-09 DIAGNOSIS — R7309 Other abnormal glucose: Secondary | ICD-10-CM

## 2011-11-09 DIAGNOSIS — Z0000 Encounter for general adult medical examination without abnormal findings: Secondary | ICD-10-CM

## 2011-11-09 LAB — URINALYSIS, ROUTINE W REFLEX MICROSCOPIC
Bilirubin Urine: NEGATIVE
Hgb urine dipstick: NEGATIVE
Ketones, ur: NEGATIVE
Total Protein, Urine: NEGATIVE
Urine Glucose: NEGATIVE
Urobilinogen, UA: 0.2 (ref 0.0–1.0)

## 2011-11-09 LAB — LIPID PANEL
Cholesterol: 172 mg/dL (ref 0–200)
HDL: 51.1 mg/dL (ref >39.00)
LDL Cholesterol: 108 mg/dL — ABNORMAL HIGH (ref 0–99)
Total CHOL/HDL Ratio: 3
Triglycerides: 66 mg/dL (ref 0.0–149.0)

## 2011-11-09 LAB — COMPREHENSIVE METABOLIC PANEL
ALT: 21 U/L (ref 0–35)
Albumin: 4.1 g/dL (ref 3.5–5.2)
CO2: 30 mEq/L (ref 19–32)
GFR: 89.31 mL/min (ref >60.00)
Glucose, Bld: 106 mg/dL — ABNORMAL HIGH (ref 70–99)
Potassium: 4.4 mEq/L (ref 3.5–5.1)
Sodium: 139 mEq/L (ref 135–145)
Total Bilirubin: 0.5 mg/dL (ref 0.3–1.2)
Total Protein: 7.4 g/dL (ref 6.0–8.3)

## 2011-11-09 LAB — CBC WITH DIFFERENTIAL/PLATELET
Basophils Relative: 0.6 % (ref 0.0–3.0)
Eosinophils Relative: 0.5 % (ref 0.0–5.0)
Lymphocytes Relative: 27.8 % (ref 12.0–46.0)
MCV: 85.2 fl (ref 78.0–100.0)
Monocytes Relative: 14.5 % — ABNORMAL HIGH (ref 3.0–12.0)
Neutrophils Relative %: 56.6 % (ref 43.0–77.0)
Platelets: 187 10*3/uL (ref 150.0–400.0)
RBC: 4.56 Mil/uL (ref 3.87–5.11)
WBC: 4.6 10*3/uL (ref 4.5–10.5)

## 2011-11-09 LAB — HEMOGLOBIN A1C: Hgb A1c MFr Bld: 5.9 % (ref 4.6–6.5)

## 2012-03-12 ENCOUNTER — Other Ambulatory Visit: Payer: Self-pay | Admitting: Internal Medicine

## 2012-03-12 DIAGNOSIS — Z1231 Encounter for screening mammogram for malignant neoplasm of breast: Secondary | ICD-10-CM

## 2012-04-21 ENCOUNTER — Ambulatory Visit
Admission: RE | Admit: 2012-04-21 | Discharge: 2012-04-21 | Disposition: A | Discharge status: Home / Self Care | Payer: 59 | Source: Ambulatory Visit | Attending: Internal Medicine | Admitting: Internal Medicine

## 2012-04-21 DIAGNOSIS — Z1231 Encounter for screening mammogram for malignant neoplasm of breast: Secondary | ICD-10-CM

## 2012-04-21 LAB — HM MAMMOGRAPHY: HM Mammogram: NORMAL

## 2012-10-09 ENCOUNTER — Ambulatory Visit (INDEPENDENT_AMBULATORY_CARE_PROVIDER_SITE_OTHER): Payer: 59 | Admitting: Internal Medicine

## 2012-10-09 ENCOUNTER — Other Ambulatory Visit (INDEPENDENT_AMBULATORY_CARE_PROVIDER_SITE_OTHER): Payer: 59

## 2012-10-09 ENCOUNTER — Encounter: Payer: Self-pay | Admitting: Internal Medicine

## 2012-10-09 VITALS — BP 122/78 | HR 87 | Temp 98.4°F | Resp 16 | Ht 66.0 in | Wt 169.5 lb

## 2012-10-09 DIAGNOSIS — Z0000 Encounter for general adult medical examination without abnormal findings: Secondary | ICD-10-CM

## 2012-10-09 LAB — CBC WITH DIFFERENTIAL/PLATELET
Basophils Absolute: 0 10*3/uL (ref 0.0–0.1)
Eosinophils Absolute: 0 10*3/uL (ref 0.0–0.7)
HCT: 38.3 % (ref 36.0–46.0)
Hemoglobin: 12.5 g/dL (ref 12.0–15.0)
Lymphs Abs: 1.4 10*3/uL (ref 0.7–4.0)
MCHC: 32.6 g/dL (ref 30.0–36.0)
Neutro Abs: 2.3 10*3/uL (ref 1.4–7.7)
Platelets: 195 10*3/uL (ref 150.0–400.0)
RDW: 14.1 % (ref 11.5–14.6)

## 2012-10-09 LAB — LIPID PANEL
Cholesterol: 188 mg/dL (ref 0–200)
LDL Cholesterol: 129 mg/dL — ABNORMAL HIGH (ref 0–99)
Triglycerides: 69 mg/dL (ref 0.0–149.0)

## 2012-10-09 LAB — TSH: TSH: 2.27 u[IU]/mL (ref 0.35–5.50)

## 2012-10-09 LAB — COMPREHENSIVE METABOLIC PANEL
AST: 23 U/L (ref 0–37)
BUN: 8 mg/dL (ref 6–23)
Calcium: 9.5 mg/dL (ref 8.4–10.5)
Chloride: 103 mEq/L (ref 96–112)
Creatinine, Ser: 0.8 mg/dL (ref 0.4–1.2)
Glucose, Bld: 106 mg/dL — ABNORMAL HIGH (ref 70–99)

## 2012-10-09 NOTE — Progress Notes (Signed)
  Subjective:    Patient ID: Kiara Holt, female    DOB: 1950-09-30, 62 y.o.   MRN: [id number]  HPI  She returns for a physical and she tells me that she feels well and has no complaints.  Review of Systems  Constitutional: Positive for unexpected weight change (5# weight gain). Negative for fever, chills, diaphoresis, activity change, appetite change and fatigue.  HENT: Negative.   Eyes: Negative.   Respiratory: Negative.  Negative for apnea, cough, shortness of breath, wheezing and stridor.   Cardiovascular: Negative.  Negative for chest pain, palpitations and leg swelling.  Gastrointestinal: Negative.  Negative for nausea, vomiting, abdominal pain, diarrhea and constipation.  Endocrine: Negative.   Genitourinary: Negative.   Musculoskeletal: Negative.   Skin: Negative.   Allergic/Immunologic: Negative.   Neurological: Negative.   Hematological: Negative.   Psychiatric/Behavioral: Negative.        Objective:   Physical Exam  Vitals reviewed. Constitutional: She is oriented to person, place, and time. She appears well-developed and well-nourished. No distress.  HENT:  Head: Normocephalic and atraumatic.  Mouth/Throat: Oropharynx is clear and moist. No oropharyngeal exudate.  Eyes: Conjunctivae are normal. Right eye exhibits no discharge. Left eye exhibits no discharge. No scleral icterus.  Neck: Normal range of motion. Neck supple. No JVD present. No tracheal deviation present. No thyromegaly present.  Cardiovascular: Normal rate, regular rhythm, normal heart sounds and intact distal pulses.  Exam reveals no gallop and no friction rub.   No murmur heard. Pulmonary/Chest: Effort normal and breath sounds normal. No stridor. No respiratory distress. She has no wheezes. She has no rales. She exhibits no tenderness.  Abdominal: Soft. Bowel sounds are normal. She exhibits no distension and no mass. There is no tenderness. There is no rebound and no guarding.  Musculoskeletal:  Normal range of motion. She exhibits no edema and no tenderness.  Lymphadenopathy:    She has no cervical adenopathy.  Neurological: She is oriented to person, place, and time.  Skin: Skin is warm and dry. No rash noted. She is not diaphoretic. No erythema. No pallor.  Psychiatric: She has a normal mood and affect. Her behavior is normal. Judgment and thought content normal.      Lab Results  Component Value Date   WBC 4.6 11/09/2011   HGB 12.6 11/09/2011   HCT 38.9 11/09/2011   PLT 187.0 11/09/2011   GLUCOSE 106* 11/09/2011   CHOL 172 11/09/2011   TRIG 66.0 11/09/2011   HDL 51.10 11/09/2011   LDLDIRECT 137.1 11/17/2008   LDLCALC 108* 11/09/2011   ALT 21 11/09/2011   AST 24 11/09/2011   NA 139 11/09/2011   K 4.4 11/09/2011   CL 101 11/09/2011   CREATININE 0.8 11/09/2011   BUN 11 11/09/2011   CO2 30 11/09/2011   TSH 1.75 11/09/2011   HGBA1C 5.9 11/09/2011      Assessment & Plan:

## 2012-10-09 NOTE — Patient Instructions (Signed)
Preventive Care for Adults, Female A healthy lifestyle and preventive care can promote health and wellness. Preventive health guidelines for women include the following key practices.  A routine yearly physical is a good way to check with your caregiver about your health and preventive screening. It is a chance to share any concerns and updates on your health, and to receive a thorough exam.  Visit your dentist for a routine exam and preventive care every 6 months. Brush your teeth twice a day and floss once a day. Good oral hygiene prevents tooth decay and gum disease.  The frequency of eye exams is based on your age, health, family medical history, use of contact lenses, and other factors. Follow your caregiver's recommendations for frequency of eye exams.  Eat a healthy diet. Foods like vegetables, fruits, whole grains, low-fat dairy products, and lean protein foods contain the nutrients you need without too many calories. Decrease your intake of foods high in solid fats, added sugars, and salt. Eat the right amount of calories for you.Get information about a proper diet from your caregiver, if necessary.  Regular physical exercise is one of the most important things you can do for your health. Most adults should get at least 150 minutes of moderate-intensity exercise (any activity that increases your heart rate and causes you to sweat) each week. In addition, most adults need muscle-strengthening exercises on 2 or more days a week.  Maintain a healthy weight. The body mass index (BMI) is a screening tool to identify possible weight problems. It provides an estimate of body fat based on height and weight. Your caregiver can help determine your BMI, and can help you achieve or maintain a healthy weight.For adults 20 years and older:  A BMI below 18.5 is considered underweight.  A BMI of 18.5 to 24.9 is normal.  A BMI of 25 to 29.9 is considered overweight.  A BMI of 30 and above is  considered obese.  Maintain normal blood lipids and cholesterol levels by exercising and minimizing your intake of saturated fat. Eat a balanced diet with plenty of fruit and vegetables. Blood tests for lipids and cholesterol should begin at age 20 and be repeated every 5 years. If your lipid or cholesterol levels are high, you are over 50, or you are at high risk for heart disease, you may need your cholesterol levels checked more frequently.Ongoing high lipid and cholesterol levels should be treated with medicines if diet and exercise are not effective.  If you smoke, find out from your caregiver how to quit. If you do not use tobacco, do not start.  If you are pregnant, do not drink alcohol. If you are breastfeeding, be very cautious about drinking alcohol. If you are not pregnant and choose to drink alcohol, do not exceed 1 drink per day. One drink is considered to be 12 ounces (355 mL) of beer, 5 ounces (148 mL) of wine, or 1.5 ounces (44 mL) of liquor.  Avoid use of street drugs. Do not share needles with anyone. Ask for help if you need support or instructions about stopping the use of drugs.  High blood pressure causes heart disease and increases the risk of stroke. Your blood pressure should be checked at least every 1 to 2 years. Ongoing high blood pressure should be treated with medicines if weight loss and exercise are not effective.  If you are 55 to 60 years old, ask your caregiver if you should take aspirin to prevent strokes.  Diabetes   screening involves taking a blood sample to check your fasting blood sugar level. This should be done once every 3 years, after age 45, if you are within normal weight and without risk factors for diabetes. Testing should be considered at a younger age or be carried out more frequently if you are overweight and have at least 1 risk factor for diabetes.  Breast cancer screening is essential preventive care for women. You should practice "breast  self-awareness." This means understanding the normal appearance and feel of your breasts and may include breast self-examination. Any changes detected, no matter how small, should be reported to a caregiver. Women in their 20s and 30s should have a clinical breast exam (CBE) by a caregiver as part of a regular health exam every 1 to 3 years. After age 40, women should have a CBE every year. Starting at age 40, women should consider having a mammography (breast X-ray test) every year. Women who have a family history of breast cancer should talk to their caregiver about genetic screening. Women at a high risk of breast cancer should talk to their caregivers about having magnetic resonance imaging (MRI) and a mammography every year.  The Pap test is a screening test for cervical cancer. A Pap test can show cell changes on the cervix that might become cervical cancer if left untreated. A Pap test is a procedure in which cells are obtained and examined from the lower end of the uterus (cervix).  Women should have a Pap test starting at age 21.  Between ages 21 and 29, Pap tests should be repeated every 2 years.  Beginning at age 30, you should have a Pap test every 3 years as long as the past 3 Pap tests have been normal.  Some women have medical problems that increase the chance of getting cervical cancer. Talk to your caregiver about these problems. It is especially important to talk to your caregiver if a new problem develops soon after your last Pap test. In these cases, your caregiver may recommend more frequent screening and Pap tests.  The above recommendations are the same for women who have or have not gotten the vaccine for human papillomavirus (HPV).  If you had a hysterectomy for a problem that was not cancer or a condition that could lead to cancer, then you no longer need Pap tests. Even if you no longer need a Pap test, a regular exam is a good idea to make sure no other problems are  starting.  If you are between ages 65 and 70, and you have had normal Pap tests going back 10 years, you no longer need Pap tests. Even if you no longer need a Pap test, a regular exam is a good idea to make sure no other problems are starting.  If you have had past treatment for cervical cancer or a condition that could lead to cancer, you need Pap tests and screening for cancer for at least 20 years after your treatment.  If Pap tests have been discontinued, risk factors (such as a new sexual partner) need to be reassessed to determine if screening should be resumed.  The HPV test is an additional test that may be used for cervical cancer screening. The HPV test looks for the virus that can cause the cell changes on the cervix. The cells collected during the Pap test can be tested for HPV. The HPV test could be used to screen women aged 30 years and older, and should   be used in women of any age who have unclear Pap test results. After the age of 30, women should have HPV testing at the same frequency as a Pap test.  Colorectal cancer can be detected and often prevented. Most routine colorectal cancer screening begins at the age of 50 and continues through age 75. However, your caregiver may recommend screening at an earlier age if you have risk factors for colon cancer. On a yearly basis, your caregiver may provide home test kits to check for hidden blood in the stool. Use of a small camera at the end of a tube, to directly examine the colon (sigmoidoscopy or colonoscopy), can detect the earliest forms of colorectal cancer. Talk to your caregiver about this at age 50, when routine screening begins. Direct examination of the colon should be repeated every 5 to 10 years through age 75, unless early forms of pre-cancerous polyps or small growths are found.  Hepatitis C blood testing is recommended for all people born from 1945 through 1965 and any individual with known risks for hepatitis C.  Practice  safe sex. Use condoms and avoid high-risk sexual practices to reduce the spread of sexually transmitted infections (STIs). STIs include gonorrhea, chlamydia, syphilis, trichomonas, herpes, HPV, and human immunodeficiency virus (HIV). Herpes, HIV, and HPV are viral illnesses that have no cure. They can result in disability, cancer, and death. Sexually active women aged 25 and younger should be checked for chlamydia. Older women with new or multiple partners should also be tested for chlamydia. Testing for other STIs is recommended if you are sexually active and at increased risk.  Osteoporosis is a disease in which the bones lose minerals and strength with aging. This can result in serious bone fractures. The risk of osteoporosis can be identified using a bone density scan. Women ages 65 and over and women at risk for fractures or osteoporosis should discuss screening with their caregivers. Ask your caregiver whether you should take a calcium supplement or vitamin D to reduce the rate of osteoporosis.  Menopause can be associated with physical symptoms and risks. Hormone replacement therapy is available to decrease symptoms and risks. You should talk to your caregiver about whether hormone replacement therapy is right for you.  Use sunscreen with sun protection factor (SPF) of 30 or more. Apply sunscreen liberally and repeatedly throughout the day. You should seek shade when your shadow is shorter than you. Protect yourself by wearing long sleeves, pants, a wide-brimmed hat, and sunglasses year round, whenever you are outdoors.  Once a month, do a whole body skin exam, using a mirror to look at the skin on your back. Notify your caregiver of new moles, moles that have irregular borders, moles that are larger than a pencil eraser, or moles that have changed in shape or color.  Stay current with required immunizations.  Influenza. You need a dose every fall (or winter). The composition of the flu vaccine  changes each year, so being vaccinated once is not enough.  Pneumococcal polysaccharide. You need 1 to 2 doses if you smoke cigarettes or if you have certain chronic medical conditions. You need 1 dose at age 65 (or older) if you have never been vaccinated.  Tetanus, diphtheria, pertussis (Tdap, Td). Get 1 dose of Tdap vaccine if you are younger than age 65, are over 65 and have contact with an infant, are a healthcare worker, are pregnant, or simply want to be protected from whooping cough. After that, you need a Td   booster dose every 10 years. Consult your caregiver if you have not had at least 3 tetanus and diphtheria-containing shots sometime in your life or have a deep or dirty wound.  HPV. You need this vaccine if you are a woman age 26 or younger. The vaccine is given in 3 doses over 6 months.  Measles, mumps, rubella (MMR). You need at least 1 dose of MMR if you were born in 1957 or later. You may also need a second dose.  Meningococcal. If you are age 19 to 21 and a first-year college student living in a residence hall, or have one of several medical conditions, you need to get vaccinated against meningococcal disease. You may also need additional booster doses.  Zoster (shingles). If you are age 60 or older, you should get this vaccine.  Varicella (chickenpox). If you have never had chickenpox or you were vaccinated but received only 1 dose, talk to your caregiver to find out if you need this vaccine.  Hepatitis A. You need this vaccine if you have a specific risk factor for hepatitis A virus infection or you simply wish to be protected from this disease. The vaccine is usually given as 2 doses, 6 to 18 months apart.  Hepatitis B. You need this vaccine if you have a specific risk factor for hepatitis B virus infection or you simply wish to be protected from this disease. The vaccine is given in 3 doses, usually over 6 months. Preventive Services / Frequency Ages 19 to 39  Blood  pressure check.** / Every 1 to 2 years.  Lipid and cholesterol check.** / Every 5 years beginning at age 20.  Clinical breast exam.** / Every 3 years for women in their 20s and 30s.  Pap test.** / Every 2 years from ages 21 through 29. Every 3 years starting at age 30 through age 65 or 70 with a history of 3 consecutive normal Pap tests.  HPV screening.** / Every 3 years from ages 30 through ages 65 to 70 with a history of 3 consecutive normal Pap tests.  Hepatitis C blood test.** / For any individual with known risks for hepatitis C.  Skin self-exam. / Monthly.  Influenza immunization.** / Every year.  Pneumococcal polysaccharide immunization.** / 1 to 2 doses if you smoke cigarettes or if you have certain chronic medical conditions.  Tetanus, diphtheria, pertussis (Tdap, Td) immunization. / A one-time dose of Tdap vaccine. After that, you need a Td booster dose every 10 years.  HPV immunization. / 3 doses over 6 months, if you are 26 and younger.  Measles, mumps, rubella (MMR) immunization. / You need at least 1 dose of MMR if you were born in 1957 or later. You may also need a second dose.  Meningococcal immunization. / 1 dose if you are age 19 to 21 and a first-year college student living in a residence hall, or have one of several medical conditions, you need to get vaccinated against meningococcal disease. You may also need additional booster doses.  Varicella immunization.** / Consult your caregiver.  Hepatitis A immunization.** / Consult your caregiver. 2 doses, 6 to 18 months apart.  Hepatitis B immunization.** / Consult your caregiver. 3 doses usually over 6 months. Ages 40 to 64  Blood pressure check.** / Every 1 to 2 years.  Lipid and cholesterol check.** / Every 5 years beginning at age 20.  Clinical breast exam.** / Every year after age 40.  Mammogram.** / Every year beginning at age 40   and continuing for as long as you are in good health. Consult with your  caregiver.  Pap test.** / Every 3 years starting at age 30 through age 65 or 70 with a history of 3 consecutive normal Pap tests.  HPV screening.** / Every 3 years from ages 30 through ages 65 to 70 with a history of 3 consecutive normal Pap tests.  Fecal occult blood test (FOBT) of stool. / Every year beginning at age 50 and continuing until age 75. You may not need to do this test if you get a colonoscopy every 10 years.  Flexible sigmoidoscopy or colonoscopy.** / Every 5 years for a flexible sigmoidoscopy or every 10 years for a colonoscopy beginning at age 50 and continuing until age 75.  Hepatitis C blood test.** / For all people born from 1945 through 1965 and any individual with known risks for hepatitis C.  Skin self-exam. / Monthly.  Influenza immunization.** / Every year.  Pneumococcal polysaccharide immunization.** / 1 to 2 doses if you smoke cigarettes or if you have certain chronic medical conditions.  Tetanus, diphtheria, pertussis (Tdap, Td) immunization.** / A one-time dose of Tdap vaccine. After that, you need a Td booster dose every 10 years.  Measles, mumps, rubella (MMR) immunization. / You need at least 1 dose of MMR if you were born in 1957 or later. You may also need a second dose.  Varicella immunization.** / Consult your caregiver.  Meningococcal immunization.** / Consult your caregiver.  Hepatitis A immunization.** / Consult your caregiver. 2 doses, 6 to 18 months apart.  Hepatitis B immunization.** / Consult your caregiver. 3 doses, usually over 6 months. Ages 65 and over  Blood pressure check.** / Every 1 to 2 years.  Lipid and cholesterol check.** / Every 5 years beginning at age 20.  Clinical breast exam.** / Every year after age 40.  Mammogram.** / Every year beginning at age 40 and continuing for as long as you are in good health. Consult with your caregiver.  Pap test.** / Every 3 years starting at age 30 through age 65 or 70 with a 3  consecutive normal Pap tests. Testing can be stopped between 65 and 70 with 3 consecutive normal Pap tests and no abnormal Pap or HPV tests in the past 10 years.  HPV screening.** / Every 3 years from ages 30 through ages 65 or 70 with a history of 3 consecutive normal Pap tests. Testing can be stopped between 65 and 70 with 3 consecutive normal Pap tests and no abnormal Pap or HPV tests in the past 10 years.  Fecal occult blood test (FOBT) of stool. / Every year beginning at age 50 and continuing until age 75. You may not need to do this test if you get a colonoscopy every 10 years.  Flexible sigmoidoscopy or colonoscopy.** / Every 5 years for a flexible sigmoidoscopy or every 10 years for a colonoscopy beginning at age 50 and continuing until age 75.  Hepatitis C blood test.** / For all people born from 1945 through 1965 and any individual with known risks for hepatitis C.  Osteoporosis screening.** / A one-time screening for women ages 65 and over and women at risk for fractures or osteoporosis.  Skin self-exam. / Monthly.  Influenza immunization.** / Every year.  Pneumococcal polysaccharide immunization.** / 1 dose at age 65 (or older) if you have never been vaccinated.  Tetanus, diphtheria, pertussis (Tdap, Td) immunization. / A one-time dose of Tdap vaccine if you are over   65 and have contact with an infant, are a healthcare worker, or simply want to be protected from whooping cough. After that, you need a Td booster dose every 10 years.  Varicella immunization.** / Consult your caregiver.  Meningococcal immunization.** / Consult your caregiver.  Hepatitis A immunization.** / Consult your caregiver. 2 doses, 6 to 18 months apart.  Hepatitis B immunization.** / Check with your caregiver. 3 doses, usually over 6 months. ** Family history and personal history of risk and conditions may change your caregiver's recommendations. Document Released: 09/18/2001 Document Revised: 10/15/2011  Document Reviewed: 12/18/2010 [employer] Patient Information 2013 [employer].  

## 2012-10-10 NOTE — Assessment & Plan Note (Signed)
Exam done Vaccines were reviewed Labs ordered mammo is UTD She needs a 10 repeat colonoscopy- ordered today Pt ed material was given

## 2013-03-16 ENCOUNTER — Other Ambulatory Visit: Payer: Self-pay

## 2013-03-16 DIAGNOSIS — Z1231 Encounter for screening mammogram for malignant neoplasm of breast: Secondary | ICD-10-CM

## 2013-04-22 ENCOUNTER — Ambulatory Visit: Payer: 59

## 2013-04-23 ENCOUNTER — Ambulatory Visit
Admission: RE | Admit: 2013-04-23 | Discharge: 2013-04-23 | Disposition: A | Discharge status: Home / Self Care | Payer: 59 | Source: Ambulatory Visit

## 2013-04-23 DIAGNOSIS — Z1231 Encounter for screening mammogram for malignant neoplasm of breast: Secondary | ICD-10-CM

## 2013-06-10 ENCOUNTER — Other Ambulatory Visit: Payer: Self-pay | Admitting: Internal Medicine

## 2013-06-10 ENCOUNTER — Telehealth: Payer: Self-pay | Admitting: *Deleted

## 2013-06-10 DIAGNOSIS — Z0000 Encounter for general adult medical examination without abnormal findings: Secondary | ICD-10-CM

## 2013-06-10 NOTE — Telephone Encounter (Signed)
Pt called requesting referral for Colonoscopy.  States it has been about 10 years since last screening.  Please advise

## 2013-06-10 NOTE — Telephone Encounter (Signed)
done

## 2013-06-10 NOTE — Telephone Encounter (Signed)
Spoke with pt advised referral done 

## 2013-07-06 ENCOUNTER — Encounter: Payer: Self-pay | Admitting: Internal Medicine

## 2013-07-07 ENCOUNTER — Telehealth: Payer: Self-pay

## 2013-07-07 NOTE — Telephone Encounter (Signed)
Per Epic last exam was 05/12/03 and shows over due, will route message to MD for referral.

## 2013-07-07 NOTE — Telephone Encounter (Signed)
She was referred to GI in March of this year for a colonoscopy

## 2013-07-07 NOTE — Telephone Encounter (Signed)
The patient called wanting to know when her next colon cancer screening apt is.  She spoke with GI, but they asked her to call her pcp.  Thanks!

## 2013-07-08 ENCOUNTER — Encounter: Payer: Self-pay | Admitting: Internal Medicine

## 2013-09-09 ENCOUNTER — Ambulatory Visit (AMBULATORY_SURGERY_CENTER): Payer: Self-pay | Admitting: *Deleted

## 2013-09-09 VITALS — Ht 66.0 in | Wt 169.0 lb

## 2013-09-09 DIAGNOSIS — Z1211 Encounter for screening for malignant neoplasm of colon: Secondary | ICD-10-CM

## 2013-09-09 MED ORDER — NA SULFATE-K SULFATE-MG SULF 17.5-3.13-1.6 GM/177ML PO SOLN
1.0000 | Freq: Once | ORAL | Status: DC
Start: 2013-09-09 — End: 2013-09-23

## 2013-09-09 NOTE — Progress Notes (Signed)
No allergies to eggs or soy. No problems with anesthesia.  

## 2013-09-10 ENCOUNTER — Encounter: Payer: Self-pay | Admitting: Internal Medicine

## 2013-09-23 ENCOUNTER — Encounter: Payer: Self-pay | Admitting: Internal Medicine

## 2013-09-23 ENCOUNTER — Ambulatory Visit (AMBULATORY_SURGERY_CENTER): Payer: 59 | Admitting: Internal Medicine

## 2013-09-23 VITALS — BP 129/80 | HR 78 | Temp 98.0°F | Resp 18 | Ht 66.0 in | Wt 169.0 lb

## 2013-09-23 DIAGNOSIS — Z1211 Encounter for screening for malignant neoplasm of colon: Secondary | ICD-10-CM

## 2013-09-23 LAB — HM COLONOSCOPY: HM Colonoscopy: NORMAL

## 2013-09-23 MED ORDER — SODIUM CHLORIDE 0.9 % IV SOLN: 500.0000 mL | INTRAVENOUS | Status: DC

## 2013-09-23 NOTE — Op Note (Signed)
[facility] [address] [place], [id number]   COLONOSCOPY PROCEDURE REPORT  PATIENT: [name], Kiara B.  MR#: [id number] BIRTHDATE: 1950-12-29 , 62  yrs. old GENDER: Female ENDOSCOPIST: Iva Booparl E Jermisha Hoffart, MD, [facility] PROCEDURE DATE:  09/23/2013 PROCEDURE:   Colonoscopy, screening First Screening Colonoscopy - Avg.  risk and is 50 yrs.  old or older - No.  Prior Negative Screening - Now for repeat screening. 10 or more years since last screening  History of Adenoma - Now for follow-up colonoscopy & has been > or = to 3 yrs.  N/A  Polyps Removed Today? No.  Recommend repeat exam, <10 yrs? No. ASA CLASS:   Class II INDICATIONS:average risk screening and Last colonoscopy performed 2004. MEDICATIONS: propofol (Diprivan) 250mg  IV  DESCRIPTION OF PROCEDURE:   After the risks benefits and alternatives of the procedure were thoroughly explained, informed consent was obtained.  A digital rectal exam revealed no abnormalities of the rectum.   The LB [id number]  endoscope was introduced through the anus and advanced to the cecum, which was identified by both the appendix and ileocecal valve. No adverse events experienced.   The quality of the prep was excellent using Suprep  The instrument was then slowly withdrawn as the colon was fully examined.      COLON FINDINGS: A normal appearing cecum, ileocecal valve, and appendiceal orifice were identified.  The ascending, hepatic flexure, transverse, splenic flexure, descending, sigmoid colon and rectum appeared unremarkable.  No polyps or cancers were seen.   A right colon retroflexion was performed.  Retroflexed views revealed no abnormalities. The time to cecum=3 minutes 33 seconds. Withdrawal time=8 minutes 04 seconds.  The scope was withdrawn and the procedure completed. COMPLICATIONS: There were no complications.  ENDOSCOPIC IMPRESSION: Normal colonoscopy - excellent prep  RECOMMENDATIONS: Repeat Colonscopy in  10 years - 2025   eSigned:  Iva Booparl E Reneisha Stilley, MD, [id number] 09/23/2013 10:10 AM   cc: Sanda Lingerhomas Jones, MD and The Patient

## 2013-09-23 NOTE — Patient Instructions (Addendum)
The colonoscopy was normal with a great prep!  Next routine colonoscopy in 10 years - 2025  I appreciate the opportunity to care for you. Iva Booparl E. Gessner, MD, FACG    YOU HAD AN ENDOSCOPIC PROCEDURE TODAY AT THE [facility]: Refer to the procedure report that was given to you for any specific questions about what was found during the examination.  If the procedure report does not answer your questions, please call your gastroenterologist to clarify.  If you requested that your care partner not be given the details of your procedure findings, then the procedure report has been included in a sealed envelope for you to review at your convenience later.  YOU SHOULD EXPECT: Some feelings of bloating in the abdomen. Passage of more gas than usual.  Walking can help get rid of the air that was put into your GI tract during the procedure and reduce the bloating. If you had a lower endoscopy (such as a colonoscopy or flexible sigmoidoscopy) you may notice spotting of blood in your stool or on the toilet paper. If you underwent a bowel prep for your procedure, then you may not have a normal bowel movement for a few days.  DIET: Your first meal following the procedure should be a light meal and then it is ok to progress to your normal diet.  A half-sandwich or bowl of soup is an example of a good first meal.  Heavy or fried foods are harder to digest and may make you feel nauseous or bloated.  Likewise meals heavy in dairy and vegetables can cause extra gas to form and this can also increase the bloating.  Drink plenty of fluids but you should avoid alcoholic beverages for 24 hours.  ACTIVITY: Your care partner should take you home directly after the procedure.  You should plan to take it easy, moving slowly for the rest of the day.  You can resume normal activity the day after the procedure however you should NOT DRIVE or use heavy machinery for 24 hours (because of the sedation medicines used  during the test).    SYMPTOMS TO REPORT IMMEDIATELY: A gastroenterologist can be reached at any hour.  During normal business hours, 8:30 AM to 5:00 PM Monday through Friday, call [phone].  After hours and on weekends, please call the GI answering service at [phone] who will take a message and have the physician on call contact you.   Following lower endoscopy (colonoscopy or flexible sigmoidoscopy):  Excessive amounts of blood in the stool  Significant tenderness or worsening of abdominal pains  Swelling of the abdomen that is new, acute  Fever of 100F or higher    FOLLOW UP: If any biopsies were taken you will be contacted by phone or by letter within the next 1-3 weeks.  Call your gastroenterologist if you have not heard about the biopsies in 3 weeks.  Our staff will call the home number listed on your records the next business day following your procedure to check on you and address any questions or concerns that you may have at that time regarding the information given to you following your procedure. This is a courtesy call and so if there is no answer at the home number and we have not heard from you through the emergency physician on call, we will assume that you have returned to your regular daily activities without incident.  SIGNATURES/CONFIDENTIALITY: You and/or your care partner have signed paperwork which will be entered  entered into your electronic medical record.  These signatures attest to the fact that that the information above on your After Visit Summary has been reviewed and is understood.  Full responsibility of the confidentiality of this discharge information lies with you and/or your care-partner. 

## 2013-09-24 ENCOUNTER — Telehealth: Payer: Self-pay | Admitting: *Deleted

## 2013-09-24 NOTE — Telephone Encounter (Signed)
  Follow up Call-  Call back number 09/23/2013  Post procedure Call Back phone  # [phone]  Permission to leave phone message Yes     Patient questions:  Do you have a fever, pain , or abdominal swelling? no Pain Score  0 *  Have you tolerated food without any problems? yes  Have you been able to return to your normal activities? yes  Do you have any questions about your discharge instructions: Diet   no Medications  no Follow up visit  no  Do you have questions or concerns about your Care? no  Actions: * If pain score is 4 or above: No action needed, pain <4.

## 2014-01-13 ENCOUNTER — Ambulatory Visit (INDEPENDENT_AMBULATORY_CARE_PROVIDER_SITE_OTHER): Payer: 59 | Admitting: Internal Medicine

## 2014-01-13 ENCOUNTER — Encounter: Payer: Self-pay | Admitting: Internal Medicine

## 2014-01-13 ENCOUNTER — Other Ambulatory Visit (INDEPENDENT_AMBULATORY_CARE_PROVIDER_SITE_OTHER): Payer: 59

## 2014-01-13 VITALS — BP 138/82 | HR 78 | Temp 98.5°F | Resp 16 | Wt 163.5 lb

## 2014-01-13 DIAGNOSIS — Z0000 Encounter for general adult medical examination without abnormal findings: Secondary | ICD-10-CM

## 2014-01-13 DIAGNOSIS — R7309 Other abnormal glucose: Secondary | ICD-10-CM

## 2014-01-13 LAB — COMPREHENSIVE METABOLIC PANEL
ALBUMIN: 4.1 g/dL (ref 3.5–5.2)
ALT: 16 U/L (ref 0–35)
AST: 23 U/L (ref 0–37)
Alkaline Phosphatase: 49 U/L (ref 39–117)
BILIRUBIN TOTAL: 0.5 mg/dL (ref 0.2–1.2)
BUN: 11 mg/dL (ref 6–23)
CO2: 28 mEq/L (ref 19–32)
CREATININE: 0.8 mg/dL (ref 0.4–1.2)
Calcium: 9.7 mg/dL (ref 8.4–10.5)
Chloride: 103 mEq/L (ref 96–112)
GFR: 99.51 mL/min (ref >60.00)
GLUCOSE: 104 mg/dL — AB (ref 70–99)
POTASSIUM: 4.4 meq/L (ref 3.5–5.1)
Sodium: 139 mEq/L (ref 135–145)
Total Protein: 7.2 g/dL (ref 6.0–8.3)

## 2014-01-13 LAB — LIPID PANEL
CHOL/HDL RATIO: 4
Cholesterol: 191 mg/dL (ref 0–200)
HDL: 43.9 mg/dL (ref >39.00)
LDL Cholesterol: 132 mg/dL — ABNORMAL HIGH (ref 0–99)
NonHDL: 147.1
TRIGLYCERIDES: 75 mg/dL (ref 0.0–149.0)
VLDL: 15 mg/dL (ref 0.0–40.0)

## 2014-01-13 LAB — CBC WITH DIFFERENTIAL/PLATELET
Basophils Absolute: 0 10*3/uL (ref 0.0–0.1)
Basophils Relative: 0.3 % (ref 0.0–3.0)
EOS PCT: 1 % (ref 0.0–5.0)
Eosinophils Absolute: 0 10*3/uL (ref 0.0–0.7)
HCT: 37.7 % (ref 36.0–46.0)
HEMOGLOBIN: 12.3 g/dL (ref 12.0–15.0)
LYMPHS PCT: 32 % (ref 12.0–46.0)
Lymphs Abs: 1.4 10*3/uL (ref 0.7–4.0)
MCHC: 32.6 g/dL (ref 30.0–36.0)
MCV: 85 fl (ref 78.0–100.0)
Monocytes Absolute: 0.6 10*3/uL (ref 0.1–1.0)
Monocytes Relative: 13.2 % — ABNORMAL HIGH (ref 3.0–12.0)
NEUTROS ABS: 2.3 10*3/uL (ref 1.4–7.7)
NEUTROS PCT: 53.5 % (ref 43.0–77.0)
Platelets: 181 10*3/uL (ref 150.0–400.0)
RBC: 4.44 Mil/uL (ref 3.87–5.11)
RDW: 14.9 % (ref 11.5–15.5)
WBC: 4.3 10*3/uL (ref 4.0–10.5)

## 2014-01-13 LAB — URINALYSIS, ROUTINE W REFLEX MICROSCOPIC
Bilirubin Urine: NEGATIVE
Hgb urine dipstick: NEGATIVE
Ketones, ur: NEGATIVE
Leukocytes, UA: NEGATIVE
NITRITE: NEGATIVE
Specific Gravity, Urine: 1.02 (ref 1.000–1.030)
TOTAL PROTEIN, URINE-UPE24: NEGATIVE
URINE GLUCOSE: NEGATIVE
Urobilinogen, UA: 0.2 (ref 0.0–1.0)
pH: 6 (ref 5.0–8.0)

## 2014-01-13 LAB — HEMOGLOBIN A1C: Hgb A1c MFr Bld: 5.9 % (ref 4.6–6.5)

## 2014-01-13 LAB — TSH: TSH: 2.08 u[IU]/mL (ref 0.35–4.50)

## 2014-01-13 NOTE — Patient Instructions (Signed)

## 2014-01-13 NOTE — Progress Notes (Signed)
   Subjective:    Patient ID: Kiara Holt, female    DOB: 11-17-1950, 63 y.o.   MRN: [id number]  HPI Comments: She returns for a physical and she tells me that she feels well and offers no complaints.     Review of Systems  Constitutional: Negative.  Negative for fever, chills, diaphoresis, appetite change and fatigue.  HENT: Negative.   Eyes: Negative.   Respiratory: Negative.  Negative for cough, choking, chest tightness, shortness of breath and stridor.   Cardiovascular: Negative.  Negative for chest pain, palpitations and leg swelling.  Gastrointestinal: Negative.  Negative for nausea, vomiting, abdominal pain, diarrhea, constipation and blood in stool.  Endocrine: Negative.  Negative for polydipsia, polyphagia and polyuria.  Genitourinary: Negative.   Musculoskeletal: Negative.  Negative for arthralgias, back pain, myalgias and neck pain.  Skin: Negative.  Negative for rash.  Allergic/Immunologic: Negative.   Neurological: Negative.   Hematological: Negative.  Negative for adenopathy. Does not bruise/bleed easily.  Psychiatric/Behavioral: Negative.        Objective:   Physical Exam  Vitals reviewed. Constitutional: She is oriented to person, place, and time. She appears well-developed and well-nourished. No distress.  HENT:  Head: Normocephalic and atraumatic.  Mouth/Throat: Oropharynx is clear and moist. No oropharyngeal exudate.  Eyes: Conjunctivae are normal. Right eye exhibits no discharge. Left eye exhibits no discharge. No scleral icterus.  Neck: Normal range of motion. Neck supple. No JVD present. No tracheal deviation present. No thyromegaly present.  Cardiovascular: Normal rate, regular rhythm, normal heart sounds and intact distal pulses.  Exam reveals no gallop and no friction rub.   No murmur heard. Pulmonary/Chest: Effort normal and breath sounds normal. No stridor. No respiratory distress. She has no wheezes. She has no rales. She exhibits no tenderness.    Abdominal: Soft. Bowel sounds are normal. She exhibits no distension and no mass. There is no tenderness. There is no rebound and no guarding.  Musculoskeletal: Normal range of motion. She exhibits no edema and no tenderness.  Lymphadenopathy:    She has no cervical adenopathy.  Neurological: She is oriented to person, place, and time.  Skin: Skin is warm and dry. No rash noted. She is not diaphoretic. No erythema. No pallor.  Psychiatric: She has a normal mood and affect. Her behavior is normal. Judgment and thought content normal.     Lab Results  Component Value Date   WBC 4.2* 10/09/2012   HGB 12.5 10/09/2012   HCT 38.3 10/09/2012   PLT 195.0 10/09/2012   GLUCOSE 106* 10/09/2012   CHOL 188 10/09/2012   TRIG 69.0 10/09/2012   HDL 45.70 10/09/2012   LDLDIRECT 137.1 11/17/2008   LDLCALC 129* 10/09/2012   ALT 18 10/09/2012   AST 23 10/09/2012   NA 140 10/09/2012   K 4.4 10/09/2012   CL 103 10/09/2012   CREATININE 0.8 10/09/2012   BUN 8 10/09/2012   CO2 29 10/09/2012   TSH 2.27 10/09/2012   HGBA1C 5.9 11/09/2011       Assessment & Plan:

## 2014-01-13 NOTE — Progress Notes (Signed)
Pre visit review using our clinic review tool, if applicable. No additional management support is needed unless otherwise documented below in the visit note. 

## 2014-01-14 NOTE — Assessment & Plan Note (Signed)
Exam done Vaccines were reviewed Labs ordered Pt ed material was given 

## 2014-03-22 ENCOUNTER — Other Ambulatory Visit: Payer: Self-pay

## 2014-03-22 DIAGNOSIS — Z1231 Encounter for screening mammogram for malignant neoplasm of breast: Secondary | ICD-10-CM

## 2014-04-26 ENCOUNTER — Ambulatory Visit
Admission: RE | Admit: 2014-04-26 | Discharge: 2014-04-26 | Disposition: A | Discharge status: Home / Self Care | Payer: 59 | Source: Ambulatory Visit

## 2014-04-26 DIAGNOSIS — Z1231 Encounter for screening mammogram for malignant neoplasm of breast: Secondary | ICD-10-CM

## 2014-04-26 LAB — HM MAMMOGRAPHY: HM Mammogram: NORMAL

## 2015-02-10 ENCOUNTER — Other Ambulatory Visit (INDEPENDENT_AMBULATORY_CARE_PROVIDER_SITE_OTHER): Payer: Commercial Managed Care - HMO

## 2015-02-10 ENCOUNTER — Encounter: Payer: Self-pay | Admitting: Internal Medicine

## 2015-02-10 ENCOUNTER — Ambulatory Visit (INDEPENDENT_AMBULATORY_CARE_PROVIDER_SITE_OTHER): Payer: Commercial Managed Care - HMO | Admitting: Internal Medicine

## 2015-02-10 VITALS — BP 120/70 | HR 91 | Temp 98.6°F | Resp 16 | Ht 66.0 in | Wt 173.0 lb

## 2015-02-10 DIAGNOSIS — Z0000 Encounter for general adult medical examination without abnormal findings: Secondary | ICD-10-CM

## 2015-02-10 DIAGNOSIS — R739 Hyperglycemia, unspecified: Secondary | ICD-10-CM

## 2015-02-10 DIAGNOSIS — I499 Cardiac arrhythmia, unspecified: Secondary | ICD-10-CM | POA: Diagnosis not present

## 2015-02-10 DIAGNOSIS — I491 Atrial premature depolarization: Secondary | ICD-10-CM | POA: Diagnosis not present

## 2015-02-10 LAB — COMPREHENSIVE METABOLIC PANEL
ALBUMIN: 4.3 g/dL (ref 3.5–5.2)
ALT: 19 U/L (ref 0–35)
AST: 23 U/L (ref 0–37)
Alkaline Phosphatase: 61 U/L (ref 39–117)
BUN: 13 mg/dL (ref 6–23)
CHLORIDE: 103 meq/L (ref 96–112)
CO2: 29 mEq/L (ref 19–32)
Calcium: 9.8 mg/dL (ref 8.4–10.5)
Creatinine, Ser: 0.84 mg/dL (ref 0.40–1.20)
GFR: 88.34 mL/min (ref >60.00)
Glucose, Bld: 101 mg/dL — ABNORMAL HIGH (ref 70–99)
Potassium: 4.6 mEq/L (ref 3.5–5.1)
Sodium: 140 mEq/L (ref 135–145)
TOTAL PROTEIN: 7.7 g/dL (ref 6.0–8.3)
Total Bilirubin: 0.5 mg/dL (ref 0.2–1.2)

## 2015-02-10 LAB — CBC WITH DIFFERENTIAL/PLATELET
BASOS PCT: 0.5 % (ref 0.0–3.0)
Basophils Absolute: 0 10*3/uL (ref 0.0–0.1)
Eosinophils Absolute: 0.1 10*3/uL (ref 0.0–0.7)
Eosinophils Relative: 1.6 % (ref 0.0–5.0)
HEMATOCRIT: 39.1 % (ref 36.0–46.0)
Hemoglobin: 12.9 g/dL (ref 12.0–15.0)
LYMPHS ABS: 1.1 10*3/uL (ref 0.7–4.0)
LYMPHS PCT: 23.2 % (ref 12.0–46.0)
MCHC: 33 g/dL (ref 30.0–36.0)
MCV: 83.1 fl (ref 78.0–100.0)
Monocytes Absolute: 0.6 10*3/uL (ref 0.1–1.0)
Monocytes Relative: 13.9 % — ABNORMAL HIGH (ref 3.0–12.0)
NEUTROS ABS: 2.8 10*3/uL (ref 1.4–7.7)
Neutrophils Relative %: 60.8 % (ref 43.0–77.0)
Platelets: 175 10*3/uL (ref 150.0–400.0)
RBC: 4.71 Mil/uL (ref 3.87–5.11)
RDW: 14.9 % (ref 11.5–15.5)
WBC: 4.6 10*3/uL (ref 4.0–10.5)

## 2015-02-10 LAB — HEMOGLOBIN A1C: Hgb A1c MFr Bld: 5.8 % (ref 4.6–6.5)

## 2015-02-10 LAB — URINALYSIS, ROUTINE W REFLEX MICROSCOPIC
Bilirubin Urine: NEGATIVE
Hgb urine dipstick: NEGATIVE
Ketones, ur: NEGATIVE
Nitrite: NEGATIVE
RBC / HPF: NONE SEEN (ref 0–?)
Specific Gravity, Urine: 1.02 (ref 1.000–1.030)
Total Protein, Urine: NEGATIVE
URINE GLUCOSE: NEGATIVE
UROBILINOGEN UA: 0.2 (ref 0.0–1.0)
pH: 6 (ref 5.0–8.0)

## 2015-02-10 LAB — LIPID PANEL
Cholesterol: 199 mg/dL (ref 0–200)
HDL: 53.7 mg/dL (ref >39.00)
LDL Cholesterol: 131 mg/dL — ABNORMAL HIGH (ref 0–99)
NONHDL: 145.3
Total CHOL/HDL Ratio: 4
Triglycerides: 73 mg/dL (ref 0.0–149.0)
VLDL: 14.6 mg/dL (ref 0.0–40.0)

## 2015-02-10 LAB — TSH: TSH: 2.49 u[IU]/mL (ref 0.35–4.50)

## 2015-02-10 NOTE — Progress Notes (Signed)
Pre visit review using our clinic review tool, if applicable. No additional management support is needed unless otherwise documented below in the visit note. 

## 2015-02-10 NOTE — Patient Instructions (Signed)
Preventive Care for Adults A healthy lifestyle and preventive care can promote health and wellness. Preventive health guidelines for women include the following key practices.  A routine yearly physical is a good way to check with your health care provider about your health and preventive screening. It is a chance to share any concerns and updates on your health and to receive a thorough exam.  Visit your dentist for a routine exam and preventive care every 6 months. Brush your teeth twice a day and floss once a day. Good oral hygiene prevents tooth decay and gum disease.  The frequency of eye exams is based on your age, health, family medical history, use of contact lenses, and other factors. Follow your health care provider's recommendations for frequency of eye exams.  Eat a healthy diet. Foods like vegetables, fruits, whole grains, low-fat dairy products, and lean protein foods contain the nutrients you need without too many calories. Decrease your intake of foods high in solid fats, added sugars, and salt. Eat the right amount of calories for you.Get information about a proper diet from your health care provider, if necessary.  Regular physical exercise is one of the most important things you can do for your health. Most adults should get at least 150 minutes of moderate-intensity exercise (any activity that increases your heart rate and causes you to sweat) each week. In addition, most adults need muscle-strengthening exercises on 2 or more days a week.  Maintain a healthy weight. The body mass index (BMI) is a screening tool to identify possible weight problems. It provides an estimate of body fat based on height and weight. Your health care provider can find your BMI and can help you achieve or maintain a healthy weight.For adults 20 years and older:  A BMI below 18.5 is considered underweight.  A BMI of 18.5 to 24.9 is normal.  A BMI of 25 to 29.9 is considered overweight.  A BMI of  30 and above is considered obese.  Maintain normal blood lipids and cholesterol levels by exercising and minimizing your intake of saturated fat. Eat a balanced diet with plenty of fruit and vegetables. Blood tests for lipids and cholesterol should begin at age 76 and be repeated every 5 years. If your lipid or cholesterol levels are high, you are over 50, or you are at high risk for heart disease, you may need your cholesterol levels checked more frequently.Ongoing high lipid and cholesterol levels should be treated with medicines if diet and exercise are not working.  If you smoke, find out from your health care provider how to quit. If you do not use tobacco, do not start.  Lung cancer screening is recommended for adults aged 22-80 years who are at high risk for developing lung cancer because of a history of smoking. A yearly low-dose CT scan of the lungs is recommended for people who have at least a 30-pack-year history of smoking and are a current smoker or have quit within the past 15 years. A pack year of smoking is smoking an average of 1 pack of cigarettes a day for 1 year (for example: 1 pack a day for 30 years or 2 packs a day for 15 years). Yearly screening should continue until the smoker has stopped smoking for at least 15 years. Yearly screening should be stopped for people who develop a health problem that would prevent them from having lung cancer treatment.  If you are pregnant, do not drink alcohol. If you are breastfeeding,  be very cautious about drinking alcohol. If you are not pregnant and choose to drink alcohol, do not have more than 1 drink per day. One drink is considered to be 12 ounces (355 mL) of beer, 5 ounces (148 mL) of wine, or 1.5 ounces (44 mL) of liquor.  Avoid use of street drugs. Do not share needles with anyone. Ask for help if you need support or instructions about stopping the use of drugs.  High blood pressure causes heart disease and increases the risk of  stroke. Your blood pressure should be checked at least every 1 to 2 years. Ongoing high blood pressure should be treated with medicines if weight loss and exercise do not work.  If you are 55-2 years old, ask your health care provider if you should take aspirin to prevent strokes.  Diabetes screening involves taking a blood sample to check your fasting blood sugar level. This should be done once every 3 years, after age 77, if you are within normal weight and without risk factors for diabetes. Testing should be considered at a younger age or be carried out more frequently if you are overweight and have at least 1 risk factor for diabetes.  Breast cancer screening is essential preventive care for women. You should practice "breast self-awareness." This means understanding the normal appearance and feel of your breasts and may include breast self-examination. Any changes detected, no matter how small, should be reported to a health care provider. Women in their 39s and 30s should have a clinical breast exam (CBE) by a health care provider as part of a regular health exam every 1 to 3 years. After age 56, women should have a CBE every year. Starting at age 10, women should consider having a mammogram (breast X-ray test) every year. Women who have a family history of breast cancer should talk to their health care provider about genetic screening. Women at a high risk of breast cancer should talk to their health care providers about having an MRI and a mammogram every year.  Breast cancer gene (BRCA)-related cancer risk assessment is recommended for women who have family members with BRCA-related cancers. BRCA-related cancers include breast, ovarian, tubal, and peritoneal cancers. Having family members with these cancers may be associated with an increased risk for harmful changes (mutations) in the breast cancer genes BRCA1 and BRCA2. Results of the assessment will determine the need for genetic counseling and  BRCA1 and BRCA2 testing.  Routine pelvic exams to screen for cancer are no longer recommended for nonpregnant women who are considered low risk for cancer of the pelvic organs (ovaries, uterus, and vagina) and who do not have symptoms. Ask your health care provider if a screening pelvic exam is right for you.  If you have had past treatment for cervical cancer or a condition that could lead to cancer, you need Pap tests and screening for cancer for at least 20 years after your treatment. If Pap tests have been discontinued, your risk factors (such as having a new sexual partner) need to be reassessed to determine if screening should be resumed. Some women have medical problems that increase the chance of getting cervical cancer. In these cases, your health care provider may recommend more frequent screening and Pap tests.  The HPV test is an additional test that may be used for cervical cancer screening. The HPV test looks for the virus that can cause the cell changes on the cervix. The cells collected during the Pap test can be  tested for HPV. The HPV test could be used to screen women aged 30 years and older, and should be used in women of any age who have unclear Pap test results. After the age of 30, women should have HPV testing at the same frequency as a Pap test.  Colorectal cancer can be detected and often prevented. Most routine colorectal cancer screening begins at the age of 50 years and continues through age 75 years. However, your health care provider may recommend screening at an earlier age if you have risk factors for colon cancer. On a yearly basis, your health care provider may provide home test kits to check for hidden blood in the stool. Use of a small camera at the end of a tube, to directly examine the colon (sigmoidoscopy or colonoscopy), can detect the earliest forms of colorectal cancer. Talk to your health care provider about this at age 50, when routine screening begins. Direct  exam of the colon should be repeated every 5-10 years through age 75 years, unless early forms of pre-cancerous polyps or small growths are found.  People who are at an increased risk for hepatitis B should be screened for this virus. You are considered at high risk for hepatitis B if:  You were born in a country where hepatitis B occurs often. Talk with your health care provider about which countries are considered high risk.  Your parents were born in a high-risk country and you have not received a shot to protect against hepatitis B (hepatitis B vaccine).  You have HIV or AIDS.  You use needles to inject street drugs.  You live with, or have sex with, someone who has hepatitis B.  You get hemodialysis treatment.  You take certain medicines for conditions like cancer, organ transplantation, and autoimmune conditions.  Hepatitis C blood testing is recommended for all people born from 1945 through 1965 and any individual with known risks for hepatitis C.  Practice safe sex. Use condoms and avoid high-risk sexual practices to reduce the spread of sexually transmitted infections (STIs). STIs include gonorrhea, chlamydia, syphilis, trichomonas, herpes, HPV, and human immunodeficiency virus (HIV). Herpes, HIV, and HPV are viral illnesses that have no cure. They can result in disability, cancer, and death.  You should be screened for sexually transmitted illnesses (STIs) including gonorrhea and chlamydia if:  You are sexually active and are younger than 24 years.  You are older than 24 years and your health care provider tells you that you are at risk for this type of infection.  Your sexual activity has changed since you were last screened and you are at an increased risk for chlamydia or gonorrhea. Ask your health care provider if you are at risk.  If you are at risk of being infected with HIV, it is recommended that you take a prescription medicine daily to prevent HIV infection. This is  called preexposure prophylaxis (PrEP). You are considered at risk if:  You are a heterosexual woman, are sexually active, and are at increased risk for HIV infection.  You take drugs by injection.  You are sexually active with a partner who has HIV.  Talk with your health care provider about whether you are at high risk of being infected with HIV. If you choose to begin PrEP, you should first be tested for HIV. You should then be tested every 3 months for as long as you are taking PrEP.  Osteoporosis is a disease in which the bones lose minerals and strength   with aging. This can result in serious bone fractures or breaks. The risk of osteoporosis can be identified using a bone density scan. Women ages 40 years and over and women at risk for fractures or osteoporosis should discuss screening with their health care providers. Ask your health care provider whether you should take a calcium supplement or vitamin D to reduce the rate of osteoporosis.  Menopause can be associated with physical symptoms and risks. Hormone replacement therapy is available to decrease symptoms and risks. You should talk to your health care provider about whether hormone replacement therapy is right for you.  Use sunscreen. Apply sunscreen liberally and repeatedly throughout the day. You should seek shade when your shadow is shorter than you. Protect yourself by wearing long sleeves, pants, a wide-brimmed hat, and sunglasses year round, whenever you are outdoors.  Once a month, do a whole body skin exam, using a mirror to look at the skin on your back. Tell your health care provider of new moles, moles that have irregular borders, moles that are larger than a pencil eraser, or moles that have changed in shape or color.  Stay current with required vaccines (immunizations).  Influenza vaccine. All adults should be immunized every year.  Tetanus, diphtheria, and acellular pertussis (Td, Tdap) vaccine. Pregnant women should  receive 1 dose of Tdap vaccine during each pregnancy. The dose should be obtained regardless of the length of time since the last dose. Immunization is preferred during the 27th-36th week of gestation. An adult who has not previously received Tdap or who does not know her vaccine status should receive 1 dose of Tdap. This initial dose should be followed by tetanus and diphtheria toxoids (Td) booster doses every 10 years. Adults with an unknown or incomplete history of completing a 3-dose immunization series with Td-containing vaccines should begin or complete a primary immunization series including a Tdap dose. Adults should receive a Td booster every 10 years.  Varicella vaccine. An adult without evidence of immunity to varicella should receive 2 doses or a second dose if she has previously received 1 dose. Pregnant females who do not have evidence of immunity should receive the first dose after pregnancy. This first dose should be obtained before leaving the health care facility. The second dose should be obtained 4-8 weeks after the first dose.  Human papillomavirus (HPV) vaccine. Females aged 13-26 years who have not received the vaccine previously should obtain the 3-dose series. The vaccine is not recommended for use in pregnant females. However, pregnancy testing is not needed before receiving a dose. If a female is found to be pregnant after receiving a dose, no treatment is needed. In that case, the remaining doses should be delayed until after the pregnancy. Immunization is recommended for any person with an immunocompromised condition through the age of 41 years if she did not get any or all doses earlier. During the 3-dose series, the second dose should be obtained 4-8 weeks after the first dose. The third dose should be obtained 24 weeks after the first dose and 16 weeks after the second dose.  Zoster vaccine. One dose is recommended for adults aged 50 years or older unless certain conditions are  present.  Measles, mumps, and rubella (MMR) vaccine. Adults born before 40 generally are considered immune to measles and mumps. Adults born in 10 or later should have 1 or more doses of MMR vaccine unless there is a contraindication to the vaccine or there is laboratory evidence of immunity to  each of the three diseases. A routine second dose of MMR vaccine should be obtained at least 28 days after the first dose for students attending postsecondary schools, health care workers, or international travelers. People who received inactivated measles vaccine or an unknown type of measles vaccine during 1963-1967 should receive 2 doses of MMR vaccine. People who received inactivated mumps vaccine or an unknown type of mumps vaccine before 1979 and are at high risk for mumps infection should consider immunization with 2 doses of MMR vaccine. For females of childbearing age, rubella immunity should be determined. If there is no evidence of immunity, females who are not pregnant should be vaccinated. If there is no evidence of immunity, females who are pregnant should delay immunization until after pregnancy. Unvaccinated health care workers born before 1957 who lack laboratory evidence of measles, mumps, or rubella immunity or laboratory confirmation of disease should consider measles and mumps immunization with 2 doses of MMR vaccine or rubella immunization with 1 dose of MMR vaccine.  Pneumococcal 13-valent conjugate (PCV13) vaccine. When indicated, a person who is uncertain of her immunization history and has no record of immunization should receive the PCV13 vaccine. An adult aged 19 years or older who has certain medical conditions and has not been previously immunized should receive 1 dose of PCV13 vaccine. This PCV13 should be followed with a dose of pneumococcal polysaccharide (PPSV23) vaccine. The PPSV23 vaccine dose should be obtained at least 8 weeks after the dose of PCV13 vaccine. An adult aged 19  years or older who has certain medical conditions and previously received 1 or more doses of PPSV23 vaccine should receive 1 dose of PCV13. The PCV13 vaccine dose should be obtained 1 or more years after the last PPSV23 vaccine dose.  Pneumococcal polysaccharide (PPSV23) vaccine. When PCV13 is also indicated, PCV13 should be obtained first. All adults aged 65 years and older should be immunized. An adult younger than age 65 years who has certain medical conditions should be immunized. Any person who resides in a nursing home or long-term care facility should be immunized. An adult smoker should be immunized. People with an immunocompromised condition and certain other conditions should receive both PCV13 and PPSV23 vaccines. People with human immunodeficiency virus (HIV) infection should be immunized as soon as possible after diagnosis. Immunization during chemotherapy or radiation therapy should be avoided. Routine use of PPSV23 vaccine is not recommended for American Indians, [place] Natives, or people younger than 65 years unless there are medical conditions that require PPSV23 vaccine. When indicated, people who have unknown immunization and have no record of immunization should receive PPSV23 vaccine. One-time revaccination 5 years after the first dose of PPSV23 is recommended for people aged 19-64 years who have chronic kidney failure, nephrotic syndrome, asplenia, or immunocompromised conditions. People who received 1-2 doses of PPSV23 before age 65 years should receive another dose of PPSV23 vaccine at age 65 years or later if at least 5 years have passed since the previous dose. Doses of PPSV23 are not needed for people immunized with PPSV23 at or after age 65 years.  Meningococcal vaccine. Adults with asplenia or persistent complement component deficiencies should receive 2 doses of quadrivalent meningococcal conjugate (MenACWY-D) vaccine. The doses should be obtained at least 2 months apart.  Microbiologists working with certain meningococcal bacteria, military recruits, people at risk during an outbreak, and people who travel to or live in countries with a high rate of meningitis should be immunized. A first-year college student up through age   21 years who is living in a residence hall should receive a dose if she did not receive a dose on or after her 16th birthday. Adults who have certain high-risk conditions should receive one or more doses of vaccine.  Hepatitis A vaccine. Adults who wish to be protected from this disease, have certain high-risk conditions, work with hepatitis A-infected animals, work in hepatitis A research labs, or travel to or work in countries with a high rate of hepatitis A should be immunized. Adults who were previously unvaccinated and who anticipate close contact with an international adoptee during the first 60 days after arrival in the [place] from a country with a high rate of hepatitis A should be immunized.  Hepatitis B vaccine. Adults who wish to be protected from this disease, have certain high-risk conditions, may be exposed to blood or other infectious body fluids, are household contacts or sex partners of hepatitis B positive people, are clients or workers in certain care facilities, or travel to or work in countries with a high rate of hepatitis B should be immunized.  Haemophilus influenzae type b (Hib) vaccine. A previously unvaccinated person with asplenia or sickle cell disease or having a scheduled splenectomy should receive 1 dose of Hib vaccine. Regardless of previous immunization, a recipient of a hematopoietic stem cell transplant should receive a 3-dose series 6-12 months after her successful transplant. Hib vaccine is not recommended for adults with HIV infection. Preventive Services / Frequency Ages 64 to 68 years  Blood pressure check.** / Every 1 to 2 years.  Lipid and cholesterol check.** / Every 5 years beginning at age  22.  Clinical breast exam.** / Every 3 years for women in their 88s and 53s.  BRCA-related cancer risk assessment.** / For women who have family members with a BRCA-related cancer (breast, ovarian, tubal, or peritoneal cancers).  Pap test.** / Every 2 years from ages 90 through 51. Every 3 years starting at age 21 through age 56 or 3 with a history of 3 consecutive normal Pap tests.  HPV screening.** / Every 3 years from ages 24 through ages 1 to 46 with a history of 3 consecutive normal Pap tests.  Hepatitis C blood test.** / For any individual with known risks for hepatitis C.  Skin self-exam. / Monthly.  Influenza vaccine. / Every year.  Tetanus, diphtheria, and acellular pertussis (Tdap, Td) vaccine.** / Consult your health care provider. Pregnant women should receive 1 dose of Tdap vaccine during each pregnancy. 1 dose of Td every 10 years.  Varicella vaccine.** / Consult your health care provider. Pregnant females who do not have evidence of immunity should receive the first dose after pregnancy.  HPV vaccine. / 3 doses over 6 months, if 72 and younger. The vaccine is not recommended for use in pregnant females. However, pregnancy testing is not needed before receiving a dose.  Measles, mumps, rubella (MMR) vaccine.** / You need at least 1 dose of MMR if you were born in 1957 or later. You may also need a 2nd dose. For females of childbearing age, rubella immunity should be determined. If there is no evidence of immunity, females who are not pregnant should be vaccinated. If there is no evidence of immunity, females who are pregnant should delay immunization until after pregnancy.  Pneumococcal 13-valent conjugate (PCV13) vaccine.** / Consult your health care provider.  Pneumococcal polysaccharide (PPSV23) vaccine.** / 1 to 2 doses if you smoke cigarettes or if you have certain conditions.  Meningococcal vaccine.** /  1 dose if you are age 19 to 21 years and a first-year college  student living in a residence hall, or have one of several medical conditions, you need to get vaccinated against meningococcal disease. You may also need additional booster doses.  Hepatitis A vaccine.** / Consult your health care provider.  Hepatitis B vaccine.** / Consult your health care provider.  Haemophilus influenzae type b (Hib) vaccine.** / Consult your health care provider. Ages 40 to 64 years  Blood pressure check.** / Every 1 to 2 years.  Lipid and cholesterol check.** / Every 5 years beginning at age 20 years.  Lung cancer screening. / Every year if you are aged 55-80 years and have a 30-pack-year history of smoking and currently smoke or have quit within the past 15 years. Yearly screening is stopped once you have quit smoking for at least 15 years or develop a health problem that would prevent you from having lung cancer treatment.  Clinical breast exam.** / Every year after age 40 years.  BRCA-related cancer risk assessment.** / For women who have family members with a BRCA-related cancer (breast, ovarian, tubal, or peritoneal cancers).  Mammogram.** / Every year beginning at age 40 years and continuing for as long as you are in good health. Consult with your health care provider.  Pap test.** / Every 3 years starting at age 30 years through age 65 or 70 years with a history of 3 consecutive normal Pap tests.  HPV screening.** / Every 3 years from ages 30 years through ages 65 to 70 years with a history of 3 consecutive normal Pap tests.  Fecal occult blood test (FOBT) of stool. / Every year beginning at age 50 years and continuing until age 75 years. You may not need to do this test if you get a colonoscopy every 10 years.  Flexible sigmoidoscopy or colonoscopy.** / Every 5 years for a flexible sigmoidoscopy or every 10 years for a colonoscopy beginning at age 50 years and continuing until age 75 years.  Hepatitis C blood test.** / For all people born from 1945 through  1965 and any individual with known risks for hepatitis C.  Skin self-exam. / Monthly.  Influenza vaccine. / Every year.  Tetanus, diphtheria, and acellular pertussis (Tdap/Td) vaccine.** / Consult your health care provider. Pregnant women should receive 1 dose of Tdap vaccine during each pregnancy. 1 dose of Td every 10 years.  Varicella vaccine.** / Consult your health care provider. Pregnant females who do not have evidence of immunity should receive the first dose after pregnancy.  Zoster vaccine.** / 1 dose for adults aged 60 years or older.  Measles, mumps, rubella (MMR) vaccine.** / You need at least 1 dose of MMR if you were born in 1957 or later. You may also need a 2nd dose. For females of childbearing age, rubella immunity should be determined. If there is no evidence of immunity, females who are not pregnant should be vaccinated. If there is no evidence of immunity, females who are pregnant should delay immunization until after pregnancy.  Pneumococcal 13-valent conjugate (PCV13) vaccine.** / Consult your health care provider.  Pneumococcal polysaccharide (PPSV23) vaccine.** / 1 to 2 doses if you smoke cigarettes or if you have certain conditions.  Meningococcal vaccine.** / Consult your health care provider.  Hepatitis A vaccine.** / Consult your health care provider.  Hepatitis B vaccine.** / Consult your health care provider.  Haemophilus influenzae type b (Hib) vaccine.** / Consult your health care provider. Ages 65   years and over  Blood pressure check.** / Every 1 to 2 years.  Lipid and cholesterol check.** / Every 5 years beginning at age 44 years.  Lung cancer screening. / Every year if you are aged 48-80 years and have a 30-pack-year history of smoking and currently smoke or have quit within the past 15 years. Yearly screening is stopped once you have quit smoking for at least 15 years or develop a health problem that would prevent you from having lung cancer  treatment.  Clinical breast exam.** / Every year after age 52 years.  BRCA-related cancer risk assessment.** / For women who have family members with a BRCA-related cancer (breast, ovarian, tubal, or peritoneal cancers).  Mammogram.** / Every year beginning at age 5 years and continuing for as long as you are in good health. Consult with your health care provider.  Pap test.** / Every 3 years starting at age 24 years through age 35 or 3 years with 3 consecutive normal Pap tests. Testing can be stopped between 65 and 70 years with 3 consecutive normal Pap tests and no abnormal Pap or HPV tests in the past 10 years.  HPV screening.** / Every 3 years from ages 36 years through ages 86 or 14 years with a history of 3 consecutive normal Pap tests. Testing can be stopped between 65 and 70 years with 3 consecutive normal Pap tests and no abnormal Pap or HPV tests in the past 10 years.  Fecal occult blood test (FOBT) of stool. / Every year beginning at age 28 years and continuing until age 75 years. You may not need to do this test if you get a colonoscopy every 10 years.  Flexible sigmoidoscopy or colonoscopy.** / Every 5 years for a flexible sigmoidoscopy or every 10 years for a colonoscopy beginning at age 21 years and continuing until age 88 years.  Hepatitis C blood test.** / For all people born from [birth year] through 1963 and any individual with known risks for hepatitis C.  Osteoporosis screening.** / A one-time screening for women ages 32 years and over and women at risk for fractures or osteoporosis.  Skin self-exam. / Monthly.  Influenza vaccine. / Every year.  Tetanus, diphtheria, and acellular pertussis (Tdap/Td) vaccine.** / 1 dose of Td every 10 years.  Varicella vaccine.** / Consult your health care provider.  Zoster vaccine.** / 1 dose for adults aged 9 years or older.  Pneumococcal 13-valent conjugate (PCV13) vaccine.** / Consult your health care provider.  Pneumococcal  polysaccharide (PPSV23) vaccine.** / 1 dose for all adults aged 62 years and older.  Meningococcal vaccine.** / Consult your health care provider.  Hepatitis A vaccine.** / Consult your health care provider.  Hepatitis B vaccine.** / Consult your health care provider.  Haemophilus influenzae type b (Hib) vaccine.** / Consult your health care provider. ** Family history and personal history of risk and conditions may change your health care provider's recommendations. Document Released: 09/18/2001 Document Revised: 12/07/2013 Document Reviewed: 12/18/2010 [facility] Patient Information 2015 [employer], [place]. This information is not intended to replace advice given to you by your health care provider. Make sure you discuss any questions you have with your health care provider.

## 2015-02-10 NOTE — Progress Notes (Signed)
Subjective:  Patient ID: Kiara Holt, female    DOB: 1951/07/26  Age: 64 y.o. MRN: [id number]  CC: Annual Exam   HPI Kiara Holt presents for a CPX- she feels well and offers no complaints.  No outpatient prescriptions prior to visit.   No facility-administered medications prior to visit.    ROS Review of Systems  Constitutional: Negative.  Negative for fever, chills, diaphoresis, activity change, appetite change and fatigue.  HENT: Negative.  Negative for trouble swallowing and voice change.   Eyes: Negative.  Negative for photophobia, pain, redness and itching.  Respiratory: Negative.  Negative for cough, chest tightness, shortness of breath and stridor.   Cardiovascular: Negative.  Negative for chest pain, palpitations and leg swelling.  Gastrointestinal: Negative.  Negative for nausea, vomiting, abdominal pain, diarrhea, constipation and blood in stool.  Endocrine: Negative.   Genitourinary: Negative.  Negative for difficulty urinating.  Musculoskeletal: Negative.  Negative for myalgias, back pain, joint swelling and arthralgias.  Skin: Negative.  Negative for rash.  Allergic/Immunologic: Negative.   Neurological: Negative.  Negative for dizziness, tremors, syncope, light-headedness and numbness.  Hematological: Negative.  Negative for adenopathy. Does not bruise/bleed easily.  Psychiatric/Behavioral: Negative.     Objective:  BP 120/70 mmHg  Pulse 91  Temp(Src) 98.6 F (37 C) (Oral)  Resp 16  Ht 5\' 6"  (1.676 m)  Wt 173 lb (78.472 kg)  BMI 27.94 kg/m2  SpO2 97%  BP Readings from Last 3 Encounters:  02/10/15 120/70  01/13/14 138/82  09/23/13 129/80    Wt Readings from Last 3 Encounters:  02/10/15 173 lb (78.472 kg)  01/13/14 163 lb 8 oz (74.163 kg)  09/23/13 169 lb (76.658 kg)    Physical Exam  Constitutional: She is oriented to person, place, and time. She appears well-developed and well-nourished. No distress.  HENT:  Head: Normocephalic and  atraumatic.  Mouth/Throat: Oropharynx is clear and moist. No oropharyngeal exudate.  Eyes: Conjunctivae are normal. Right eye exhibits no discharge. Left eye exhibits no discharge. No scleral icterus.  Neck: Normal range of motion. Neck supple. No JVD present. No tracheal deviation present. No thyromegaly present.  Cardiovascular: Normal rate, regular rhythm, S1 normal, S2 normal, normal heart sounds and intact distal pulses.   Occasional extrasystoles are present. Exam reveals no gallop and no friction rub.   No murmur heard. Pulses:      Carotid pulses are 1+ on the right side, and 1+ on the left side.      Radial pulses are 1+ on the right side, and 1+ on the left side.       Femoral pulses are 1+ on the right side, and 1+ on the left side.      Popliteal pulses are 1+ on the right side, and 1+ on the left side.       Dorsalis pedis pulses are 1+ on the right side, and 1+ on the left side.       Posterior tibial pulses are 1+ on the right side, and 1+ on the left side.  EKG today  Sinus  Rhythm  - occasional PAC    # PACs = 1. WITHIN NORMAL LIMITS  Pulmonary/Chest: Effort normal and breath sounds normal. No stridor. No respiratory distress. She has no wheezes. She has no rales. She exhibits no tenderness.  Abdominal: Soft. Bowel sounds are normal. She exhibits no distension and no mass. There is no tenderness. There is no rebound and no guarding.  Musculoskeletal: Normal range of  motion. She exhibits no edema or tenderness.  Lymphadenopathy:    She has no cervical adenopathy.  Neurological: She is oriented to person, place, and time.  Skin: Skin is warm and dry. No rash noted. She is not diaphoretic. No erythema. No pallor.  Vitals reviewed.   Lab Results  Component Value Date   WBC 4.3 01/13/2014   HGB 12.3 01/13/2014   HCT 37.7 01/13/2014   PLT 181.0 01/13/2014   GLUCOSE 104* 01/13/2014   CHOL 191 01/13/2014   TRIG 75.0 01/13/2014   HDL 43.90 01/13/2014   LDLDIRECT 137.1  11/17/2008   LDLCALC 132* 01/13/2014   ALT 16 01/13/2014   AST 23 01/13/2014   NA 139 01/13/2014   K 4.4 01/13/2014   CL 103 01/13/2014   CREATININE 0.8 01/13/2014   BUN 11 01/13/2014   CO2 28 01/13/2014   TSH 2.08 01/13/2014   HGBA1C 5.9 01/13/2014    Mm Digital Screening Bilateral  04/26/2014   CLINICAL DATA:  Screening.  EXAM: DIGITAL SCREENING BILATERAL MAMMOGRAM WITH CAD  COMPARISON:  None.  ACR Breast Density Category c: The breast tissue is heterogeneously dense, which may obscure small masses  FINDINGS: There are no findings suspicious for malignancy. Images were processed with CAD.  IMPRESSION: No mammographic evidence of malignancy. A result letter of this screening mammogram will be mailed directly to the patient.  RECOMMENDATION: Screening mammogram in one year. (Code:SM-B-01Y)  BI-RADS CATEGORY  1: Negative.   Electronically Signed   By: Hulan Saas M.D.   On: 04/26/2014 16:52    Assessment & Plan:   Kiara Holt was seen today for annual exam.  Diagnoses and all orders for this visit:  Routine general medical examination at a health care facility- exam done, vaccines were reviewed, mammo and PAP are UTD, labs ordered  Orders: -     Lipid panel; Future -     Comprehensive metabolic panel; Future -     CBC with Differential/Platelet; Future -     TSH; Future -     Urinalysis, Routine w reflex microscopic (not at [facility]); Future  Hyperglycemia- will recheck her A1C to see if she has developed DM2 Orders: -     Hemoglobin A1c; Future  Irregular heart rhythm- she has benign PAC's and no related s/s, will observe for now Orders: -     EKG 12-Lead  PAC (premature atrial contraction)   Kiara Holt does not currently have medications on file.  No orders of the defined types were placed in this encounter.     Follow-up: Return in about 1 year (around 02/10/2016).  Sanda Linger, MD

## 2015-03-28 ENCOUNTER — Other Ambulatory Visit: Payer: Self-pay

## 2015-03-28 DIAGNOSIS — Z1231 Encounter for screening mammogram for malignant neoplasm of breast: Secondary | ICD-10-CM

## 2015-04-28 ENCOUNTER — Ambulatory Visit
Admission: RE | Admit: 2015-04-28 | Discharge: 2015-04-28 | Disposition: A | Discharge status: Home / Self Care | Payer: Commercial Managed Care - HMO | Source: Ambulatory Visit

## 2015-04-28 DIAGNOSIS — Z1231 Encounter for screening mammogram for malignant neoplasm of breast: Secondary | ICD-10-CM

## 2015-04-28 LAB — HM MAMMOGRAPHY: HM MAMMO: NORMAL

## 2015-05-01 NOTE — Addendum Note (Signed)
Addended by: Etta Grandchild on: 05/01/2015 01:48 PM   Modules accepted: Kipp Brood

## 2016-02-13 ENCOUNTER — Encounter: Payer: Commercial Managed Care - HMO | Admitting: Internal Medicine

## 2016-02-17 ENCOUNTER — Ambulatory Visit (INDEPENDENT_AMBULATORY_CARE_PROVIDER_SITE_OTHER): Payer: Commercial Managed Care - HMO | Admitting: Internal Medicine

## 2016-02-17 ENCOUNTER — Other Ambulatory Visit (INDEPENDENT_AMBULATORY_CARE_PROVIDER_SITE_OTHER): Payer: Commercial Managed Care - HMO

## 2016-02-17 ENCOUNTER — Encounter: Payer: Self-pay | Admitting: Internal Medicine

## 2016-02-17 VITALS — BP 144/82 | HR 86 | Temp 98.3°F | Resp 16 | Ht 66.0 in | Wt 172.2 lb

## 2016-02-17 DIAGNOSIS — I10 Essential (primary) hypertension: Secondary | ICD-10-CM | POA: Insufficient documentation

## 2016-02-17 DIAGNOSIS — Z1159 Encounter for screening for other viral diseases: Secondary | ICD-10-CM | POA: Insufficient documentation

## 2016-02-17 DIAGNOSIS — Z0000 Encounter for general adult medical examination without abnormal findings: Secondary | ICD-10-CM

## 2016-02-17 DIAGNOSIS — R739 Hyperglycemia, unspecified: Secondary | ICD-10-CM

## 2016-02-17 LAB — LIPID PANEL
CHOL/HDL RATIO: 4
Cholesterol: 173 mg/dL (ref 0–200)
HDL: 43.7 mg/dL (ref >39.00)
LDL CALC: 111 mg/dL — AB (ref 0–99)
NonHDL: 129.26
TRIGLYCERIDES: 89 mg/dL (ref 0.0–149.0)
VLDL: 17.8 mg/dL (ref 0.0–40.0)

## 2016-02-17 LAB — COMPREHENSIVE METABOLIC PANEL
ALK PHOS: 55 U/L (ref 39–117)
ALT: 25 U/L (ref 0–35)
AST: 24 U/L (ref 0–37)
Albumin: 4.4 g/dL (ref 3.5–5.2)
BILIRUBIN TOTAL: 0.6 mg/dL (ref 0.2–1.2)
BUN: 13 mg/dL (ref 6–23)
CALCIUM: 10 mg/dL (ref 8.4–10.5)
CO2: 27 meq/L (ref 19–32)
Chloride: 102 mEq/L (ref 96–112)
Creatinine, Ser: 0.85 mg/dL (ref 0.40–1.20)
GFR: 86.85 mL/min (ref >60.00)
Glucose, Bld: 104 mg/dL — ABNORMAL HIGH (ref 70–99)
Potassium: 4.4 mEq/L (ref 3.5–5.1)
Sodium: 139 mEq/L (ref 135–145)
Total Protein: 7.8 g/dL (ref 6.0–8.3)

## 2016-02-17 LAB — CBC WITH DIFFERENTIAL/PLATELET
BASOS ABS: 0 10*3/uL (ref 0.0–0.1)
Basophils Relative: 0.9 % (ref 0.0–3.0)
Eosinophils Absolute: 0.2 10*3/uL (ref 0.0–0.7)
Eosinophils Relative: 3.7 % (ref 0.0–5.0)
HEMATOCRIT: 39.1 % (ref 36.0–46.0)
HEMOGLOBIN: 12.8 g/dL (ref 12.0–15.0)
LYMPHS PCT: 26.9 % (ref 12.0–46.0)
Lymphs Abs: 1.5 10*3/uL (ref 0.7–4.0)
MCHC: 32.8 g/dL (ref 30.0–36.0)
MCV: 83.4 fl (ref 78.0–100.0)
MONOS PCT: 10.3 % (ref 3.0–12.0)
Monocytes Absolute: 0.6 10*3/uL (ref 0.1–1.0)
NEUTROS ABS: 3.2 10*3/uL (ref 1.4–7.7)
Neutrophils Relative %: 58.2 % (ref 43.0–77.0)
Platelets: 214 10*3/uL (ref 150.0–400.0)
RBC: 4.69 Mil/uL (ref 3.87–5.11)
RDW: 14.6 % (ref 11.5–15.5)
WBC: 5.4 10*3/uL (ref 4.0–10.5)

## 2016-02-17 LAB — HEMOGLOBIN A1C: Hgb A1c MFr Bld: 5.8 % (ref 4.6–6.5)

## 2016-02-17 LAB — TSH: TSH: 1.71 u[IU]/mL (ref 0.35–4.50)

## 2016-02-17 NOTE — Progress Notes (Signed)
Pre visit review using our clinic review tool, if applicable. No additional management support is needed unless otherwise documented below in the visit note. 

## 2016-02-17 NOTE — Progress Notes (Signed)
Subjective:  Patient ID: Kiara Holt, female    DOB: [birth year]  Age: 65 y.o. MRN: [id number]  CC: Annual Exam and Hypertension   HPI ORILLA TEMPLEMAN presents for a CPX.  She is being monitored for borderline elevation in her blood pressure. She is working on her lifestyle modifications and exercises 3 times a week. She has had no episodes of chest pain, headache, blurred vision, dyspnea on exertion, palpitations, edema, or fatigue.   Past Medical History  Diagnosis Date  . Arthritis     shoulder   Past Surgical History  Procedure Laterality Date  . Abdominal hysterectomy  1972  . Colonoscopy      reports that she has never smoked. She has never used smokeless tobacco. She reports that she does not drink alcohol or use illicit drugs. family history includes Arthritis in her mother; Diabetes in her mother; Kidney disease in her mother. There is no history of Cancer, Heart disease, Hyperlipidemia, Hypertension, Stroke, or Colon cancer. No Known Allergies  No outpatient prescriptions prior to visit.   No facility-administered medications prior to visit.    ROS Review of Systems  Constitutional: Negative.  Negative for activity change and fatigue.  HENT: Negative.   Eyes: Negative.  Negative for visual disturbance.  Respiratory: Negative.  Negative for apnea, cough, shortness of breath and stridor.   Cardiovascular: Negative.  Negative for chest pain, palpitations and leg swelling.  Gastrointestinal: Negative.  Negative for nausea, vomiting, abdominal pain, diarrhea and constipation.  Endocrine: Negative.   Genitourinary: Negative.  Negative for dysuria, hematuria, decreased urine volume and difficulty urinating.  Musculoskeletal: Negative.  Negative for myalgias, back pain, arthralgias and neck pain.  Skin: Negative.  Negative for color change and rash.  Allergic/Immunologic: Negative.   Neurological: Negative.  Negative for dizziness, weakness and light-headedness.    Hematological: Negative.  Negative for adenopathy. Does not bruise/bleed easily.  Psychiatric/Behavioral: Negative.     Objective:  BP 144/82 mmHg  Pulse 86  Temp(Src) 98.3 F (36.8 C) (Oral)  Resp 16  Ht  (1.676 m)  Wt 172 lb 4 oz (78.132 kg)  BMI 27.82 kg/m2  SpO2 98%  BP Readings from Last 3 Encounters:  02/17/16 144/82  02/10/15 120/70  01/13/14 138/82    Wt Readings from Last 3 Encounters:  02/17/16 172 lb 4 oz (78.132 kg)  02/10/15 173 lb (78.472 kg)  01/13/14 163 lb 8 oz (74.163 kg)    Physical Exam  Constitutional: She is oriented to person, place, and time. No distress.  HENT:  Mouth/Throat: Oropharynx is clear and moist. No oropharyngeal exudate.  Eyes: Conjunctivae are normal. Right eye exhibits no discharge. Left eye exhibits no discharge. No scleral icterus.  Neck: Normal range of motion. Neck supple. No JVD present. No tracheal deviation present. No thyromegaly present.  Cardiovascular: Normal rate, regular rhythm, normal heart sounds and intact distal pulses.  Exam reveals no gallop and no friction rub.   No murmur heard. Pulmonary/Chest: Effort normal and breath sounds normal. No stridor. No respiratory distress. She has no wheezes. She has no rales. She exhibits no tenderness.  Abdominal: Soft. Bowel sounds are normal. She exhibits no distension and no mass. There is no tenderness. There is no rebound and no guarding.  Musculoskeletal: Normal range of motion. She exhibits no edema or tenderness.  Lymphadenopathy:    She has no cervical adenopathy.  Neurological: She is oriented to person, place, and time.  Skin: Skin is warm and dry. No  rash noted. She is not diaphoretic. No erythema. No pallor.  Vitals reviewed.   Lab Results  Component Value Date   WBC 5.4 02/17/2016   HGB 12.8 02/17/2016   HCT 39.1 02/17/2016   PLT 214.0 02/17/2016   GLUCOSE 104* 02/17/2016   CHOL 173 02/17/2016   TRIG 89.0 02/17/2016   HDL 43.70 02/17/2016   LDLDIRECT  137.1 11/17/2008   LDLCALC 111* 02/17/2016   ALT 25 02/17/2016   AST 24 02/17/2016   NA 139 02/17/2016   K 4.4 02/17/2016   CL 102 02/17/2016   CREATININE 0.85 02/17/2016   BUN 13 02/17/2016   CO2 27 02/17/2016   TSH 1.71 02/17/2016   HGBA1C 5.8 02/17/2016    Mm Digital Screening Bilateral  04/28/2015  CLINICAL DATA:  Screening. EXAM: DIGITAL SCREENING BILATERAL MAMMOGRAM WITH CAD COMPARISON:  Previous exam(s). ACR Breast Density Category d: The breast tissue is extremely dense, which lowers the sensitivity of mammography. FINDINGS: There are no findings suspicious for malignancy. Images were processed with CAD. IMPRESSION: No mammographic evidence of malignancy. A result letter of this screening mammogram will be mailed directly to the patient. RECOMMENDATION: Screening mammogram in one year. (Code:SM-B-01Y) BI-RADS CATEGORY  1: Negative. Electronically Signed   By: Frederico HammanMichelle  Collins M.D.   On: 04/28/2015 12:46    Assessment & Plan:   Sallye OberLouise was seen today for annual exam and hypertension.  Diagnoses and all orders for this visit:  Hyperglycemia- she has mild prediabetes and agrees to work on her lifestyle modifications with diet and weight loss. -     Hemoglobin A1c; Future  Routine general medical examination at a health care facility- exam done, labs ordered and reviewed, Pap smear is not indicated since she is status post hysterectomy, mammogram and colonoscopy are up-to-date, patient education material was given. -     Lipid panel; Future -     Comprehensive metabolic panel; Future -     CBC with Differential/Platelet; Future -     TSH; Future -     HIV antibody; Future  Need for hepatitis C screening test -     Hepatitis C antibody; Future  Essential hypertension, benign- her blood pressure has risen and her systolic is now a little above 140, I asked her to start taking an antihypertensive agent but she is not willing to do that. She will continue to work on her lifestyle  modifications and will return in about 6 months for a blood pressure recheck.  Ms. Noah CharonWardlow does not currently have medications on file.  No orders of the defined types were placed in this encounter.     Follow-up: Return in about 6 months (around 08/19/2016).  Sanda Lingerhomas Cyann Venti, MD

## 2016-02-17 NOTE — Patient Instructions (Signed)
Preventive Care for Adults, Female A healthy lifestyle and preventive care can promote health and wellness. Preventive health guidelines for women include the following key practices.  A routine yearly physical is a good way to check with your health care provider about your health and preventive screening. It is a chance to share any concerns and updates on your health and to receive a thorough exam.  Visit your dentist for a routine exam and preventive care every 6 months. Brush your teeth twice a day and floss once a day. Good oral hygiene prevents tooth decay and gum disease.  The frequency of eye exams is based on your age, health, family medical history, use of contact lenses, and other factors. Follow your health care provider's recommendations for frequency of eye exams.  Eat a healthy diet. Foods like vegetables, fruits, whole grains, low-fat dairy products, and lean protein foods contain the nutrients you need without too many calories. Decrease your intake of foods high in solid fats, added sugars, and salt. Eat the right amount of calories for you.Get information about a proper diet from your health care provider, if necessary.  Regular physical exercise is one of the most important things you can do for your health. Most adults should get at least 150 minutes of moderate-intensity exercise (any activity that increases your heart rate and causes you to sweat) each week. In addition, most adults need muscle-strengthening exercises on 2 or more days a week.  Maintain a healthy weight. The body mass index (BMI) is a screening tool to identify possible weight problems. It provides an estimate of body fat based on height and weight. Your health care provider can find your BMI and can help you achieve or maintain a healthy weight.For adults 20 years and older:  A BMI below 18.5 is considered underweight.  A BMI of 18.5 to 24.9 is normal.  A BMI of 25 to 29.9 is considered overweight.  A  BMI of 30 and above is considered obese.  Maintain normal blood lipids and cholesterol levels by exercising and minimizing your intake of saturated fat. Eat a balanced diet with plenty of fruit and vegetables. Blood tests for lipids and cholesterol should begin at age 45 and be repeated every 5 years. If your lipid or cholesterol levels are high, you are over 50, or you are at high risk for heart disease, you may need your cholesterol levels checked more frequently.Ongoing high lipid and cholesterol levels should be treated with medicines if diet and exercise are not working.  If you smoke, find out from your health care provider how to quit. If you do not use tobacco, do not start.  Lung cancer screening is recommended for adults aged 45-80 years who are at high risk for developing lung cancer because of a history of smoking. A yearly low-dose CT scan of the lungs is recommended for people who have at least a 30-pack-year history of smoking and are a current smoker or have quit within the past 15 years. A pack year of smoking is smoking an average of 1 pack of cigarettes a day for 1 year (for example: 1 pack a day for 30 years or 2 packs a day for 15 years). Yearly screening should continue until the smoker has stopped smoking for at least 15 years. Yearly screening should be stopped for people who develop a health problem that would prevent them from having lung cancer treatment.  If you are pregnant, do not drink alcohol. If you are  breastfeeding, be very cautious about drinking alcohol. If you are not pregnant and choose to drink alcohol, do not have more than 1 drink per day. One drink is considered to be 12 ounces (355 mL) of beer, 5 ounces (148 mL) of wine, or 1.5 ounces (44 mL) of liquor.  Avoid use of street drugs. Do not share needles with anyone. Ask for help if you need support or instructions about stopping the use of drugs.  High blood pressure causes heart disease and increases the risk  of stroke. Your blood pressure should be checked at least every 1 to 2 years. Ongoing high blood pressure should be treated with medicines if weight loss and exercise do not work.  If you are 20-52 years old, ask your health care provider if you should take aspirin to prevent strokes.  Diabetes screening is done by taking a blood sample to check your blood glucose level after you have not eaten for a certain period of time (fasting). If you are not overweight and you do not have risk factors for diabetes, you should be screened once every 3 years starting at age 17. If you are overweight or obese and you are 49-59 years of age, you should be screened for diabetes every year as part of your cardiovascular risk assessment.  Breast cancer screening is essential preventive care for women. You should practice "breast self-awareness." This means understanding the normal appearance and feel of your breasts and may include breast self-examination. Any changes detected, no matter how small, should be reported to a health care provider. Women in their 29s and 30s should have a clinical breast exam (CBE) by a health care provider as part of a regular health exam every 1 to 3 years. After age 21, women should have a CBE every year. Starting at age 25, women should consider having a mammogram (breast X-ray test) every year. Women who have a family history of breast cancer should talk to their health care provider about genetic screening. Women at a high risk of breast cancer should talk to their health care providers about having an MRI and a mammogram every year.  Breast cancer gene (BRCA)-related cancer risk assessment is recommended for women who have family members with BRCA-related cancers. BRCA-related cancers include breast, ovarian, tubal, and peritoneal cancers. Having family members with these cancers may be associated with an increased risk for harmful changes (mutations) in the breast cancer genes BRCA1 and  BRCA2. Results of the assessment will determine the need for genetic counseling and BRCA1 and BRCA2 testing.  Your health care provider may recommend that you be screened regularly for cancer of the pelvic organs (ovaries, uterus, and vagina). This screening involves a pelvic examination, including checking for microscopic changes to the surface of your cervix (Pap test). You may be encouraged to have this screening done every 3 years, beginning at age 44.  For women ages 25-65, health care providers may recommend pelvic exams and Pap testing every 3 years, or they may recommend the Pap and pelvic exam, combined with testing for human papilloma virus (HPV), every 5 years. Some types of HPV increase your risk of cervical cancer. Testing for HPV may also be done on women of any age with unclear Pap test results.  Other health care providers may not recommend any screening for nonpregnant women who are considered low risk for pelvic cancer and who do not have symptoms. Ask your health care provider if a screening pelvic exam is right for  you.  If you have had past treatment for cervical cancer or a condition that could lead to cancer, you need Pap tests and screening for cancer for at least 20 years after your treatment. If Pap tests have been discontinued, your risk factors (such as having a new sexual partner) need to be reassessed to determine if screening should resume. Some women have medical problems that increase the chance of getting cervical cancer. In these cases, your health care provider may recommend more frequent screening and Pap tests.  Colorectal cancer can be detected and often prevented. Most routine colorectal cancer screening begins at the age of 50 years and continues through age 75 years. However, your health care provider may recommend screening at an earlier age if you have risk factors for colon cancer. On a yearly basis, your health care provider may provide home test kits to check  for hidden blood in the stool. Use of a small camera at the end of a tube, to directly examine the colon (sigmoidoscopy or colonoscopy), can detect the earliest forms of colorectal cancer. Talk to your health care provider about this at age 50, when routine screening begins. Direct exam of the colon should be repeated every 5-10 years through age 75 years, unless early forms of precancerous polyps or small growths are found.  People who are at an increased risk for hepatitis B should be screened for this virus. You are considered at high risk for hepatitis B if:  You were born in a country where hepatitis B occurs often. Talk with your health care provider about which countries are considered high risk.  Your parents were born in a high-risk country and you have not received a shot to protect against hepatitis B (hepatitis B vaccine).  You have HIV or AIDS.  You use needles to inject street drugs.  You live with, or have sex with, someone who has hepatitis B.  You get hemodialysis treatment.  You take certain medicines for conditions like cancer, organ transplantation, and autoimmune conditions.  Hepatitis C blood testing is recommended for all people born from 1945 through 1965 and any individual with known risks for hepatitis C.  Practice safe sex. Use condoms and avoid high-risk sexual practices to reduce the spread of sexually transmitted infections (STIs). STIs include gonorrhea, chlamydia, syphilis, trichomonas, herpes, HPV, and human immunodeficiency virus (HIV). Herpes, HIV, and HPV are viral illnesses that have no cure. They can result in disability, cancer, and death.  You should be screened for sexually transmitted illnesses (STIs) including gonorrhea and chlamydia if:  You are sexually active and are younger than 24 years.  You are older than 24 years and your health care provider tells you that you are at risk for this type of infection.  Your sexual activity has changed  since you were last screened and you are at an increased risk for chlamydia or gonorrhea. Ask your health care provider if you are at risk.  If you are at risk of being infected with HIV, it is recommended that you take a prescription medicine daily to prevent HIV infection. This is called preexposure prophylaxis (PrEP). You are considered at risk if:  You are sexually active and do not regularly use condoms or know the HIV status of your partner(s).  You take drugs by injection.  You are sexually active with a partner who has HIV.  Talk with your health care provider about whether you are at high risk of being infected with HIV. If   you choose to begin PrEP, you should first be tested for HIV. You should then be tested every 3 months for as long as you are taking PrEP.  Osteoporosis is a disease in which the bones lose minerals and strength with aging. This can result in serious bone fractures or breaks. The risk of osteoporosis can be identified using a bone density scan. Women ages 67 years and over and women at risk for fractures or osteoporosis should discuss screening with their health care providers. Ask your health care provider whether you should take a calcium supplement or vitamin D to reduce the rate of osteoporosis.  Menopause can be associated with physical symptoms and risks. Hormone replacement therapy is available to decrease symptoms and risks. You should talk to your health care provider about whether hormone replacement therapy is right for you.  Use sunscreen. Apply sunscreen liberally and repeatedly throughout the day. You should seek shade when your shadow is shorter than you. Protect yourself by wearing long sleeves, pants, a wide-brimmed hat, and sunglasses year round, whenever you are outdoors.  Once a month, do a whole body skin exam, using a mirror to look at the skin on your back. Tell your health care provider of new moles, moles that have irregular borders, moles that  are larger than a pencil eraser, or moles that have changed in shape or color.  Stay current with required vaccines (immunizations).  Influenza vaccine. All adults should be immunized every year.  Tetanus, diphtheria, and acellular pertussis (Td, Tdap) vaccine. Pregnant women should receive 1 dose of Tdap vaccine during each pregnancy. The dose should be obtained regardless of the length of time since the last dose. Immunization is preferred during the 27th-36th week of gestation. An adult who has not previously received Tdap or who does not know her vaccine status should receive 1 dose of Tdap. This initial dose should be followed by tetanus and diphtheria toxoids (Td) booster doses every 10 years. Adults with an unknown or incomplete history of completing a 3-dose immunization series with Td-containing vaccines should begin or complete a primary immunization series including a Tdap dose. Adults should receive a Td booster every 10 years.  Varicella vaccine. An adult without evidence of immunity to varicella should receive 2 doses or a second dose if she has previously received 1 dose. Pregnant females who do not have evidence of immunity should receive the first dose after pregnancy. This first dose should be obtained before leaving the health care facility. The second dose should be obtained 4-8 weeks after the first dose.  Human papillomavirus (HPV) vaccine. Females aged 13-26 years who have not received the vaccine previously should obtain the 3-dose series. The vaccine is not recommended for use in pregnant females. However, pregnancy testing is not needed before receiving a dose. If a female is found to be pregnant after receiving a dose, no treatment is needed. In that case, the remaining doses should be delayed until after the pregnancy. Immunization is recommended for any person with an immunocompromised condition through the age of 61 years if she did not get any or all doses earlier. During the  3-dose series, the second dose should be obtained 4-8 weeks after the first dose. The third dose should be obtained 24 weeks after the first dose and 16 weeks after the second dose.  Zoster vaccine. One dose is recommended for adults aged 30 years or older unless certain conditions are present.  Measles, mumps, and rubella (MMR) vaccine. Adults born  before 1957 generally are considered immune to measles and mumps. Adults born in 1957 or later should have 1 or more doses of MMR vaccine unless there is a contraindication to the vaccine or there is laboratory evidence of immunity to each of the three diseases. A routine second dose of MMR vaccine should be obtained at least 28 days after the first dose for students attending postsecondary schools, health care workers, or international travelers. People who received inactivated measles vaccine or an unknown type of measles vaccine during 1963-1967 should receive 2 doses of MMR vaccine. People who received inactivated mumps vaccine or an unknown type of mumps vaccine before 1979 and are at high risk for mumps infection should consider immunization with 2 doses of MMR vaccine. For females of childbearing age, rubella immunity should be determined. If there is no evidence of immunity, females who are not pregnant should be vaccinated. If there is no evidence of immunity, females who are pregnant should delay immunization until after pregnancy. Unvaccinated health care workers born before 1957 who lack laboratory evidence of measles, mumps, or rubella immunity or laboratory confirmation of disease should consider measles and mumps immunization with 2 doses of MMR vaccine or rubella immunization with 1 dose of MMR vaccine.  Pneumococcal 13-valent conjugate (PCV13) vaccine. When indicated, a person who is uncertain of his immunization history and has no record of immunization should receive the PCV13 vaccine. All adults 65 years of age and older should receive this  vaccine. An adult aged 19 years or older who has certain medical conditions and has not been previously immunized should receive 1 dose of PCV13 vaccine. This PCV13 should be followed with a dose of pneumococcal polysaccharide (PPSV23) vaccine. Adults who are at high risk for pneumococcal disease should obtain the PPSV23 vaccine at least 8 weeks after the dose of PCV13 vaccine. Adults older than 63 years of age who have normal immune system function should obtain the PPSV23 vaccine dose at least 1 year after the dose of PCV13 vaccine.  Pneumococcal polysaccharide (PPSV23) vaccine. When PCV13 is also indicated, PCV13 should be obtained first. All adults aged 65 years and older should be immunized. An adult younger than age 65 years who has certain medical conditions should be immunized. Any person who resides in a nursing home or long-term care facility should be immunized. An adult smoker should be immunized. People with an immunocompromised condition and certain other conditions should receive both PCV13 and PPSV23 vaccines. People with human immunodeficiency virus (HIV) infection should be immunized as soon as possible after diagnosis. Immunization during chemotherapy or radiation therapy should be avoided. Routine use of PPSV23 vaccine is not recommended for American Indians, [place] Natives, or people younger than 65 years unless there are medical conditions that require PPSV23 vaccine. When indicated, people who have unknown immunization and have no record of immunization should receive PPSV23 vaccine. One-time revaccination 5 years after the first dose of PPSV23 is recommended for people aged 19-64 years who have chronic kidney failure, nephrotic syndrome, asplenia, or immunocompromised conditions. People who received 1-2 doses of PPSV23 before age 65 years should receive another dose of PPSV23 vaccine at age 65 years or later if at least 5 years have passed since the previous dose. Doses of PPSV23 are not  needed for people immunized with PPSV23 at or after age 65 years.  Meningococcal vaccine. Adults with asplenia or persistent complement component deficiencies should receive 2 doses of quadrivalent meningococcal conjugate (MenACWY-D) vaccine. The doses should be obtained   at least 2 months apart. Microbiologists working with certain meningococcal bacteria, [place] recruits, people at risk during an outbreak, and people who travel to or live in countries with a high rate of meningitis should be immunized. A first-year college student up through age 34 years who is living in a residence hall should receive a dose if she did not receive a dose on or after her 16th birthday. Adults who have certain high-risk conditions should receive one or more doses of vaccine.  Hepatitis A vaccine. Adults who wish to be protected from this disease, have certain high-risk conditions, work with hepatitis A-infected animals, work in hepatitis A research labs, or travel to or work in countries with a high rate of hepatitis A should be immunized. Adults who were previously unvaccinated and who anticipate close contact with an international adoptee during the first 60 days after arrival in the [place] from a country with a high rate of hepatitis A should be immunized.  Hepatitis B vaccine. Adults who wish to be protected from this disease, have certain high-risk conditions, may be exposed to blood or other infectious body fluids, are household contacts or sex partners of hepatitis B positive people, are clients or workers in certain care facilities, or travel to or work in countries with a high rate of hepatitis B should be immunized.  Haemophilus influenzae type b (Hib) vaccine. A previously unvaccinated person with asplenia or sickle cell disease or having a scheduled splenectomy should receive 1 dose of Hib vaccine. Regardless of previous immunization, a recipient of a hematopoietic stem cell transplant should receive a  3-dose series 6-12 months after her successful transplant. Hib vaccine is not recommended for adults with HIV infection. Preventive Services / Frequency Ages 35 to 4 years  Blood pressure check.** / Every 3-5 years.  Lipid and cholesterol check.** / Every 5 years beginning at age 60.  Clinical breast exam.** / Every 3 years for women in their 71s and 10s.  BRCA-related cancer risk assessment.** / For women who have family members with a BRCA-related cancer (breast, ovarian, tubal, or peritoneal cancers).  Pap test.** / Every 2 years from ages 76 through 26. Every 3 years starting at age 61 through age 76 or 93 with a history of 3 consecutive normal Pap tests.  HPV screening.** / Every 3 years from ages 37 through ages 60 to 51 with a history of 3 consecutive normal Pap tests.  Hepatitis C blood test.** / For any individual with known risks for hepatitis C.  Skin self-exam. / Monthly.  Influenza vaccine. / Every year.  Tetanus, diphtheria, and acellular pertussis (Tdap, Td) vaccine.** / Consult your health care provider. Pregnant women should receive 1 dose of Tdap vaccine during each pregnancy. 1 dose of Td every 10 years.  Varicella vaccine.** / Consult your health care provider. Pregnant females who do not have evidence of immunity should receive the first dose after pregnancy.  HPV vaccine. / 3 doses over 6 months, if 93 and younger. The vaccine is not recommended for use in pregnant females. However, pregnancy testing is not needed before receiving a dose.  Measles, mumps, rubella (MMR) vaccine.** / You need at least 1 dose of MMR if you were born in 1957 or later. You may also need a 2nd dose. For females of childbearing age, rubella immunity should be determined. If there is no evidence of immunity, females who are not pregnant should be vaccinated. If there is no evidence of immunity, females who are  pregnant should delay immunization until after pregnancy.  Pneumococcal  13-valent conjugate (PCV13) vaccine.** / Consult your health care provider.  Pneumococcal polysaccharide (PPSV23) vaccine.** / 1 to 2 doses if you smoke cigarettes or if you have certain conditions.  Meningococcal vaccine.** / 1 dose if you are age 68 to 8 years and a Market researcher living in a residence hall, or have one of several medical conditions, you need to get vaccinated against meningococcal disease. You may also need additional booster doses.  Hepatitis A vaccine.** / Consult your health care provider.  Hepatitis B vaccine.** / Consult your health care provider.  Haemophilus influenzae type b (Hib) vaccine.** / Consult your health care provider. Ages 7 to 53 years  Blood pressure check.** / Every year.  Lipid and cholesterol check.** / Every 5 years beginning at age 25 years.  Lung cancer screening. / Every year if you are aged 11-80 years and have a 30-pack-year history of smoking and currently smoke or have quit within the past 15 years. Yearly screening is stopped once you have quit smoking for at least 15 years or develop a health problem that would prevent you from having lung cancer treatment.  Clinical breast exam.** / Every year after age 48 years.  BRCA-related cancer risk assessment.** / For women who have family members with a BRCA-related cancer (breast, ovarian, tubal, or peritoneal cancers).  Mammogram.** / Every year beginning at age 41 years and continuing for as long as you are in good health. Consult with your health care provider.  Pap test.** / Every 3 years starting at age 65 years through age 37 or 70 years with a history of 3 consecutive normal Pap tests.  HPV screening.** / Every 3 years from ages 72 years through ages 60 to 40 years with a history of 3 consecutive normal Pap tests.  Fecal occult blood test (FOBT) of stool. / Every year beginning at age 21 years and continuing until age 5 years. You may not need to do this test if you get  a colonoscopy every 10 years.  Flexible sigmoidoscopy or colonoscopy.** / Every 5 years for a flexible sigmoidoscopy or every 10 years for a colonoscopy beginning at age 35 years and continuing until age 48 years.  Hepatitis C blood test.** / For all people born from [birth year] through 1965 and any individual with known risks for hepatitis C.  Skin self-exam. / Monthly.  Influenza vaccine. / Every year.  Tetanus, diphtheria, and acellular pertussis (Tdap/Td) vaccine.** / Consult your health care provider. Pregnant women should receive 1 dose of Tdap vaccine during each pregnancy. 1 dose of Td every 10 years.  Varicella vaccine.** / Consult your health care provider. Pregnant females who do not have evidence of immunity should receive the first dose after pregnancy.  Zoster vaccine.** / 1 dose for adults aged 30 years or older.  Measles, mumps, rubella (MMR) vaccine.** / You need at least 1 dose of MMR if you were born in 1957 or later. You may also need a second dose. For females of childbearing age, rubella immunity should be determined. If there is no evidence of immunity, females who are not pregnant should be vaccinated. If there is no evidence of immunity, females who are pregnant should delay immunization until after pregnancy.  Pneumococcal 13-valent conjugate (PCV13) vaccine.** / Consult your health care provider.  Pneumococcal polysaccharide (PPSV23) vaccine.** / 1 to 2 doses if you smoke cigarettes or if you have certain conditions.  Meningococcal vaccine.** /  Consult your health care provider.  Hepatitis A vaccine.** / Consult your health care provider.  Hepatitis B vaccine.** / Consult your health care provider.  Haemophilus influenzae type b (Hib) vaccine.** / Consult your health care provider. Ages 64 years and over  Blood pressure check.** / Every year.  Lipid and cholesterol check.** / Every 5 years beginning at age 23 years.  Lung cancer screening. / Every year if you  are aged 16-80 years and have a 30-pack-year history of smoking and currently smoke or have quit within the past 15 years. Yearly screening is stopped once you have quit smoking for at least 15 years or develop a health problem that would prevent you from having lung cancer treatment.  Clinical breast exam.** / Every year after age 74 years.  BRCA-related cancer risk assessment.** / For women who have family members with a BRCA-related cancer (breast, ovarian, tubal, or peritoneal cancers).  Mammogram.** / Every year beginning at age 44 years and continuing for as long as you are in good health. Consult with your health care provider.  Pap test.** / Every 3 years starting at age 58 years through age 22 or 39 years with 3 consecutive normal Pap tests. Testing can be stopped between 65 and 70 years with 3 consecutive normal Pap tests and no abnormal Pap or HPV tests in the past 10 years.  HPV screening.** / Every 3 years from ages 64 years through ages 70 or 61 years with a history of 3 consecutive normal Pap tests. Testing can be stopped between 65 and 70 years with 3 consecutive normal Pap tests and no abnormal Pap or HPV tests in the past 10 years.  Fecal occult blood test (FOBT) of stool. / Every year beginning at age 40 years and continuing until age 27 years. You may not need to do this test if you get a colonoscopy every 10 years.  Flexible sigmoidoscopy or colonoscopy.** / Every 5 years for a flexible sigmoidoscopy or every 10 years for a colonoscopy beginning at age 7 years and continuing until age 32 years.  Hepatitis C blood test.** / For all people born from [birth year] through 1963 and any individual with known risks for hepatitis C.  Osteoporosis screening.** / A one-time screening for women ages 30 years and over and women at risk for fractures or osteoporosis.  Skin self-exam. / Monthly.  Influenza vaccine. / Every year.  Tetanus, diphtheria, and acellular pertussis (Tdap/Td)  vaccine.** / 1 dose of Td every 10 years.  Varicella vaccine.** / Consult your health care provider.  Zoster vaccine.** / 1 dose for adults aged 35 years or older.  Pneumococcal 13-valent conjugate (PCV13) vaccine.** / Consult your health care provider.  Pneumococcal polysaccharide (PPSV23) vaccine.** / 1 dose for all adults aged 46 years and older.  Meningococcal vaccine.** / Consult your health care provider.  Hepatitis A vaccine.** / Consult your health care provider.  Hepatitis B vaccine.** / Consult your health care provider.  Haemophilus influenzae type b (Hib) vaccine.** / Consult your health care provider. ** Family history and personal history of risk and conditions may change your health care provider's recommendations.   This information is not intended to replace advice given to you by your health care provider. Make sure you discuss any questions you have with your health care provider.   Document Released: 09/18/2001 Document Revised: 08/13/2014 Document Reviewed: 12/18/2010 Elsevier Interactive Patient Education [employer].

## 2016-02-18 ENCOUNTER — Encounter: Payer: Self-pay | Admitting: Internal Medicine

## 2016-02-18 LAB — HIV ANTIBODY (ROUTINE TESTING W REFLEX): HIV 1&2 Ab, 4th Generation: NONREACTIVE

## 2016-02-18 LAB — HEPATITIS C ANTIBODY: HCV AB: NEGATIVE

## 2016-04-10 ENCOUNTER — Other Ambulatory Visit: Payer: Self-pay | Admitting: Internal Medicine

## 2016-04-10 DIAGNOSIS — Z1231 Encounter for screening mammogram for malignant neoplasm of breast: Secondary | ICD-10-CM

## 2016-04-30 ENCOUNTER — Ambulatory Visit
Admission: RE | Admit: 2016-04-30 | Discharge: 2016-04-30 | Disposition: A | Discharge status: Home / Self Care | Payer: Commercial Managed Care - HMO | Source: Ambulatory Visit | Attending: Internal Medicine | Admitting: Internal Medicine

## 2016-04-30 DIAGNOSIS — Z1231 Encounter for screening mammogram for malignant neoplasm of breast: Secondary | ICD-10-CM

## 2016-05-01 LAB — HM MAMMOGRAPHY

## 2016-08-20 ENCOUNTER — Encounter: Payer: Self-pay | Admitting: Internal Medicine

## 2016-08-20 ENCOUNTER — Other Ambulatory Visit (INDEPENDENT_AMBULATORY_CARE_PROVIDER_SITE_OTHER): Payer: Commercial Managed Care - HMO

## 2016-08-20 ENCOUNTER — Ambulatory Visit (INDEPENDENT_AMBULATORY_CARE_PROVIDER_SITE_OTHER): Payer: Commercial Managed Care - HMO | Admitting: Internal Medicine

## 2016-08-20 VITALS — BP 150/90 | HR 86 | Temp 98.1°F | Resp 16 | Ht 66.0 in | Wt 173.2 lb

## 2016-08-20 DIAGNOSIS — Z0000 Encounter for general adult medical examination without abnormal findings: Secondary | ICD-10-CM | POA: Diagnosis not present

## 2016-08-20 DIAGNOSIS — I10 Essential (primary) hypertension: Secondary | ICD-10-CM

## 2016-08-20 LAB — COMPREHENSIVE METABOLIC PANEL
ALBUMIN: 4.3 g/dL (ref 3.5–5.2)
ALT: 17 U/L (ref 0–35)
AST: 21 U/L (ref 0–37)
Alkaline Phosphatase: 52 U/L (ref 39–117)
BUN: 11 mg/dL (ref 6–23)
CALCIUM: 9.8 mg/dL (ref 8.4–10.5)
CHLORIDE: 106 meq/L (ref 96–112)
CO2: 28 mEq/L (ref 19–32)
Creatinine, Ser: 0.84 mg/dL (ref 0.40–1.20)
GFR: 87.9 mL/min (ref >60.00)
Glucose, Bld: 105 mg/dL — ABNORMAL HIGH (ref 70–99)
POTASSIUM: 4.1 meq/L (ref 3.5–5.1)
SODIUM: 140 meq/L (ref 135–145)
Total Bilirubin: 0.5 mg/dL (ref 0.2–1.2)
Total Protein: 7.4 g/dL (ref 6.0–8.3)

## 2016-08-20 LAB — CBC WITH DIFFERENTIAL/PLATELET
BASOS PCT: 0.5 % (ref 0.0–3.0)
Basophils Absolute: 0 10*3/uL (ref 0.0–0.1)
EOS PCT: 7.1 % — AB (ref 0.0–5.0)
Eosinophils Absolute: 0.4 10*3/uL (ref 0.0–0.7)
HEMATOCRIT: 37.3 % (ref 36.0–46.0)
HEMOGLOBIN: 12.5 g/dL (ref 12.0–15.0)
LYMPHS PCT: 26.5 % (ref 12.0–46.0)
Lymphs Abs: 1.4 10*3/uL (ref 0.7–4.0)
MCHC: 33.4 g/dL (ref 30.0–36.0)
MCV: 81.9 fl (ref 78.0–100.0)
MONOS PCT: 13 % — AB (ref 3.0–12.0)
Monocytes Absolute: 0.7 10*3/uL (ref 0.1–1.0)
Neutro Abs: 2.9 10*3/uL (ref 1.4–7.7)
Neutrophils Relative %: 52.9 % (ref 43.0–77.0)
Platelets: 200 10*3/uL (ref 150.0–400.0)
RBC: 4.56 Mil/uL (ref 3.87–5.11)
RDW: 15.8 % — AB (ref 11.5–15.5)
WBC: 5.4 10*3/uL (ref 4.0–10.5)

## 2016-08-20 LAB — TSH: TSH: 2.75 u[IU]/mL (ref 0.35–4.50)

## 2016-08-20 LAB — URINALYSIS, ROUTINE W REFLEX MICROSCOPIC
BILIRUBIN URINE: NEGATIVE
HGB URINE DIPSTICK: NEGATIVE
KETONES UR: NEGATIVE
LEUKOCYTES UA: NEGATIVE
NITRITE: NEGATIVE
Specific Gravity, Urine: 1.015 (ref 1.000–1.030)
TOTAL PROTEIN, URINE-UPE24: NEGATIVE
Urine Glucose: NEGATIVE
Urobilinogen, UA: 0.2 (ref 0.0–1.0)
pH: 7 (ref 5.0–8.0)

## 2016-08-20 LAB — LIPID PANEL
CHOLESTEROL: 181 mg/dL (ref 0–200)
HDL: 51.2 mg/dL (ref >39.00)
LDL CALC: 115 mg/dL — AB (ref 0–99)
NonHDL: 130.21
TRIGLYCERIDES: 77 mg/dL (ref 0.0–149.0)
Total CHOL/HDL Ratio: 4
VLDL: 15.4 mg/dL (ref 0.0–40.0)

## 2016-08-20 NOTE — Progress Notes (Signed)
Subjective:  Patient ID: Kiara Holt, female    DOB: 1951-07-21  Age: 66 y.o. MRN: [id number]  CC: Annual Exam and Hypertension   HPI Kiara Holt presents for a CPX.  She is being followed for hypertension - she is aware that her blood pressure is not adequately well controlled but she is also not willing to take an antihypertensive medication. She is working diligently on her lifestyle modifications. She's had no recent episodes of chest pain, shortness of breath, palpitations, edema, fatigue, headache, or blurred vision.  No outpatient prescriptions prior to visit.   No facility-administered medications prior to visit.     ROS Review of Systems  Constitutional: Negative.  Negative for appetite change, chills, diaphoresis, fatigue and unexpected weight change.  HENT: Negative.   Eyes: Negative for visual disturbance.  Respiratory: Negative for apnea, cough, chest tightness, shortness of breath and wheezing.   Cardiovascular: Negative.  Negative for chest pain, palpitations and leg swelling.  Gastrointestinal: Negative.  Negative for abdominal pain, constipation, diarrhea, nausea and vomiting.  Endocrine: Negative.   Genitourinary: Negative.  Negative for decreased urine volume, difficulty urinating, dysuria, flank pain, frequency, hematuria and urgency.  Musculoskeletal: Negative.  Negative for back pain, myalgias and neck pain.  Skin: Negative.  Negative for color change and rash.  Allergic/Immunologic: Negative.   Neurological: Negative.  Negative for dizziness, weakness, numbness and headaches.  Hematological: Negative for adenopathy. Does not bruise/bleed easily.  Psychiatric/Behavioral: Negative.     Objective:  BP (!) 150/90 (BP Location: Left Arm, Patient Position: Sitting, Cuff Size: Normal)   Pulse 86   Temp 98.1 F (36.7 C) (Oral)   Resp 16   Ht 5\' 6"  (1.676 m)   Wt 173 lb 4 oz (78.6 kg)   SpO2 96%   BMI 27.96 kg/m   BP Readings from Last 3  Encounters:  08/20/16 (!) 150/90  02/17/16 (!) 144/82  02/10/15 120/70    Wt Readings from Last 3 Encounters:  08/20/16 173 lb 4 oz (78.6 kg)  02/17/16 172 lb 4 oz (78.1 kg)  02/10/15 173 lb (78.5 kg)    Physical Exam  Constitutional: She is oriented to person, place, and time. No distress.  HENT:  Mouth/Throat: Oropharynx is clear and moist. No oropharyngeal exudate.  Eyes: Conjunctivae are normal. Right eye exhibits no discharge. Left eye exhibits no discharge. No scleral icterus.  Neck: Normal range of motion. Neck supple. No JVD present. No tracheal deviation present. No thyromegaly present.  Cardiovascular: Normal rate, regular rhythm, normal heart sounds and intact distal pulses.  Exam reveals no gallop and no friction rub.   No murmur heard. Pulmonary/Chest: Effort normal and breath sounds normal. No stridor. No respiratory distress. She has no wheezes. She has no rales. She exhibits no tenderness.  Abdominal: Soft. Bowel sounds are normal. She exhibits no distension and no mass. There is no tenderness. There is no rebound and no guarding.  Musculoskeletal: Normal range of motion. She exhibits no edema, tenderness or deformity.  Lymphadenopathy:    She has no cervical adenopathy.  Neurological: She is oriented to person, place, and time.  Skin: Skin is warm and dry. No rash noted. She is not diaphoretic. No erythema. No pallor.  Vitals reviewed.   Lab Results  Component Value Date   WBC 5.4 08/20/2016   HGB 12.5 08/20/2016   HCT 37.3 08/20/2016   PLT 200.0 08/20/2016   GLUCOSE 105 (H) 08/20/2016   CHOL 181 08/20/2016   TRIG 77.0  08/20/2016   HDL 51.20 08/20/2016   LDLDIRECT 137.1 11/17/2008   LDLCALC 115 (H) 08/20/2016   ALT 17 08/20/2016   AST 21 08/20/2016   NA 140 08/20/2016   K 4.1 08/20/2016   CL 106 08/20/2016   CREATININE 0.84 08/20/2016   BUN 11 08/20/2016   CO2 28 08/20/2016   TSH 2.75 08/20/2016   HGBA1C 5.8 02/17/2016    Mm Digital Screening  Bilateral  Result Date: 05/01/2016 CLINICAL DATA:  Screening. EXAM: DIGITAL SCREENING BILATERAL MAMMOGRAM WITH CAD COMPARISON:  Previous exam(s). ACR Breast Density Category c: The breast tissue is heterogeneously dense, which may obscure small masses. FINDINGS: There are no findings suspicious for malignancy. Images were processed with CAD. IMPRESSION: No mammographic evidence of malignancy. A result letter of this screening mammogram will be mailed directly to the patient. RECOMMENDATION: Screening mammogram in one year. (Code:SM-B-01Y) BI-RADS CATEGORY  1: Negative. Electronically Signed   By: Sherian Rein M.D.   On: 05/01/2016 13:27    Assessment & Plan:   Kiara Holt was seen today for annual exam and hypertension.  Diagnoses and all orders for this visit:  Essential hypertension, benign- her blood pressure is not adequately well controlled, her labs are negative for any evidence of end organ damage or secondary causes, she refuses to take an antihypertensive, she agrees to work on her lifestyle modifications -     Urinalysis, Routine w reflex microscopic; Future  Routine general medical examination at a health care facility- Exam completed, labs ordered and reviewed, she refused a flu vaccine today, her colonoscopy/Pap smear/mammogram are all up-to-date, patient education material was given. Her Framingham risk score is only 2% so I do not recommend that she take a statin. -     Lipid panel; Future -     Comprehensive metabolic panel; Future -     CBC with Differential/Platelet; Future -     TSH; Future   Kiara Holt does not currently have medications on file.  No orders of the defined types were placed in this encounter.    Follow-up: Return in about 6 months (around 02/17/2017).  Sanda Linger, MD

## 2016-08-20 NOTE — Patient Instructions (Signed)
Hypertension Hypertension, commonly called high blood pressure, is when the force of blood pumping through your arteries is too strong. Your arteries are the blood vessels that carry blood from your heart throughout your body. A blood pressure reading consists of a higher number over a lower number, such as 110/72. The higher number (systolic) is the pressure inside your arteries when your heart pumps. The lower number (diastolic) is the pressure inside your arteries when your heart relaxes. Ideally you want your blood pressure below 120/80. Hypertension forces your heart to work harder to pump blood. Your arteries may become narrow or stiff. Having untreated or uncontrolled hypertension can cause heart attack, stroke, kidney disease, and other problems. What increases the risk? Some risk factors for high blood pressure are controllable. Others are not. Risk factors you cannot control include:  Race. You may be at higher risk if you are African American.  Age. Risk increases with age.  Gender. Men are at higher risk than women before age 45 years. After age 65, women are at higher risk than men. Risk factors you can control include:  Not getting enough exercise or physical activity.  Being overweight.  Getting too much fat, sugar, calories, or salt in your diet.  Drinking too much alcohol. What are the signs or symptoms? Hypertension does not usually cause signs or symptoms. Extremely high blood pressure (hypertensive crisis) may cause headache, anxiety, shortness of breath, and nosebleed. How is this diagnosed? To check if you have hypertension, your health care provider will measure your blood pressure while you are seated, with your arm held at the level of your heart. It should be measured at least twice using the same arm. Certain conditions can cause a difference in blood pressure between your right and left arms. A blood pressure reading that is higher than normal on one occasion does  not mean that you need treatment. If it is not clear whether you have high blood pressure, you may be asked to return on a different day to have your blood pressure checked again. Or, you may be asked to monitor your blood pressure at home for 1 or more weeks. How is this treated? Treating high blood pressure includes making lifestyle changes and possibly taking medicine. Living a healthy lifestyle can help lower high blood pressure. You may need to change some of your habits. Lifestyle changes may include:  Following the DASH diet. This diet is high in fruits, vegetables, and whole grains. It is low in salt, red meat, and added sugars.  Keep your sodium intake below 2,300 mg per day.  Getting at least 30-45 minutes of aerobic exercise at least 4 times per week.  Losing weight if necessary.  Not smoking.  Limiting alcoholic beverages.  Learning ways to reduce stress. Your health care provider may prescribe medicine if lifestyle changes are not enough to get your blood pressure under control, and if one of the following is true:  You are 18-59 years of age and your systolic blood pressure is above 140.  You are 60 years of age or older, and your systolic blood pressure is above 150.  Your diastolic blood pressure is above 90.  You have diabetes, and your systolic blood pressure is over 140 or your diastolic blood pressure is over 90.  You have kidney disease and your blood pressure is above 140/90.  You have heart disease and your blood pressure is above 140/90. Your personal target blood pressure may vary depending on your medical   conditions, your age, and other factors. Follow these instructions at home:  Have your blood pressure rechecked as directed by your health care provider.  Take medicines only as directed by your health care provider. Follow the directions carefully. Blood pressure medicines must be taken as prescribed. The medicine does not work as well when you skip  doses. Skipping doses also puts you at risk for problems.  Do not smoke.  Monitor your blood pressure at home as directed by your health care provider. Contact a health care provider if:  You think you are having a reaction to medicines taken.  You have recurrent headaches or feel dizzy.  You have swelling in your ankles.  You have trouble with your vision. Get help right away if:  You develop a severe headache or confusion.  You have unusual weakness, numbness, or feel faint.  You have severe chest or abdominal pain.  You vomit repeatedly.  You have trouble breathing. This information is not intended to replace advice given to you by your health care provider. Make sure you discuss any questions you have with your health care provider. Document Released: 07/23/2005 Document Revised: 12/29/2015 Document Reviewed: 05/15/2013 Elsevier Interactive Patient Education  2017 Elsevier Inc.  

## 2016-08-20 NOTE — Progress Notes (Signed)
Pre visit review using our clinic review tool, if applicable. No additional management support is needed unless otherwise documented below in the visit note. 

## 2017-03-29 ENCOUNTER — Other Ambulatory Visit: Payer: Self-pay | Admitting: Internal Medicine

## 2017-03-29 DIAGNOSIS — Z1231 Encounter for screening mammogram for malignant neoplasm of breast: Secondary | ICD-10-CM

## 2017-05-03 ENCOUNTER — Ambulatory Visit
Admission: RE | Admit: 2017-05-03 | Discharge: 2017-05-03 | Disposition: A | Discharge status: Home / Self Care | Payer: Commercial Managed Care - HMO | Source: Ambulatory Visit | Attending: Internal Medicine | Admitting: Internal Medicine

## 2017-05-03 DIAGNOSIS — Z1231 Encounter for screening mammogram for malignant neoplasm of breast: Secondary | ICD-10-CM

## 2017-05-03 LAB — HM MAMMOGRAPHY

## 2017-10-21 ENCOUNTER — Other Ambulatory Visit (INDEPENDENT_AMBULATORY_CARE_PROVIDER_SITE_OTHER): Payer: 59

## 2017-10-21 ENCOUNTER — Ambulatory Visit (INDEPENDENT_AMBULATORY_CARE_PROVIDER_SITE_OTHER): Payer: 59 | Admitting: Internal Medicine

## 2017-10-21 ENCOUNTER — Encounter: Payer: Self-pay | Admitting: Internal Medicine

## 2017-10-21 VITALS — BP 164/84 | HR 86 | Temp 98.8°F | Resp 16 | Ht 66.0 in | Wt 181.5 lb

## 2017-10-21 DIAGNOSIS — I10 Essential (primary) hypertension: Secondary | ICD-10-CM | POA: Diagnosis not present

## 2017-10-21 DIAGNOSIS — E559 Vitamin D deficiency, unspecified: Secondary | ICD-10-CM | POA: Insufficient documentation

## 2017-10-21 DIAGNOSIS — R739 Hyperglycemia, unspecified: Secondary | ICD-10-CM

## 2017-10-21 DIAGNOSIS — Z0000 Encounter for general adult medical examination without abnormal findings: Secondary | ICD-10-CM | POA: Diagnosis not present

## 2017-10-21 LAB — URINALYSIS, ROUTINE W REFLEX MICROSCOPIC
Bilirubin Urine: NEGATIVE
HGB URINE DIPSTICK: NEGATIVE
Ketones, ur: NEGATIVE
NITRITE: NEGATIVE
SPECIFIC GRAVITY, URINE: 1.01 (ref 1.000–1.030)
TOTAL PROTEIN, URINE-UPE24: NEGATIVE
Urine Glucose: NEGATIVE
Urobilinogen, UA: 0.2 (ref 0.0–1.0)

## 2017-10-21 LAB — CBC WITH DIFFERENTIAL/PLATELET
BASOS PCT: 1.1 % (ref 0.0–3.0)
Basophils Absolute: 0.1 10*3/uL (ref 0.0–0.1)
EOS PCT: 1.6 % (ref 0.0–5.0)
Eosinophils Absolute: 0.1 10*3/uL (ref 0.0–0.7)
HCT: 39.4 % (ref 36.0–46.0)
HEMOGLOBIN: 12.8 g/dL (ref 12.0–15.0)
Lymphocytes Relative: 33.7 % (ref 12.0–46.0)
Lymphs Abs: 1.6 10*3/uL (ref 0.7–4.0)
MCHC: 32.5 g/dL (ref 30.0–36.0)
MCV: 84.2 fl (ref 78.0–100.0)
MONOS PCT: 15 % — AB (ref 3.0–12.0)
Monocytes Absolute: 0.7 10*3/uL (ref 0.1–1.0)
Neutro Abs: 2.3 10*3/uL (ref 1.4–7.7)
Neutrophils Relative %: 48.6 % (ref 43.0–77.0)
Platelets: 203 10*3/uL (ref 150.0–400.0)
RBC: 4.68 Mil/uL (ref 3.87–5.11)
RDW: 14.7 % (ref 11.5–15.5)
WBC: 4.6 10*3/uL (ref 4.0–10.5)

## 2017-10-21 LAB — THYROID PANEL WITH TSH
FREE THYROXINE INDEX: 2.2 (ref 1.4–3.8)
T3 Uptake: 28 % (ref 22–35)
T4, Total: 7.8 ug/dL (ref 5.1–11.9)
TSH: 1.87 m[IU]/L (ref 0.40–4.50)

## 2017-10-21 LAB — COMPREHENSIVE METABOLIC PANEL
ALBUMIN: 4.3 g/dL (ref 3.5–5.2)
ALT: 17 U/L (ref 0–35)
AST: 21 U/L (ref 0–37)
Alkaline Phosphatase: 60 U/L (ref 39–117)
BUN: 12 mg/dL (ref 6–23)
CO2: 32 mEq/L (ref 19–32)
Calcium: 10.1 mg/dL (ref 8.4–10.5)
Chloride: 101 mEq/L (ref 96–112)
Creatinine, Ser: 0.81 mg/dL (ref 0.40–1.20)
GFR: 91.33 mL/min (ref >60.00)
Glucose, Bld: 115 mg/dL — ABNORMAL HIGH (ref 70–99)
POTASSIUM: 4.3 meq/L (ref 3.5–5.1)
Sodium: 142 mEq/L (ref 135–145)
TOTAL PROTEIN: 7.7 g/dL (ref 6.0–8.3)
Total Bilirubin: 0.5 mg/dL (ref 0.2–1.2)

## 2017-10-21 LAB — LIPID PANEL
CHOLESTEROL: 178 mg/dL (ref 0–200)
HDL: 53.5 mg/dL (ref >39.00)
LDL Cholesterol: 106 mg/dL — ABNORMAL HIGH (ref 0–99)
NONHDL: 124.98
Total CHOL/HDL Ratio: 3
Triglycerides: 95 mg/dL (ref 0.0–149.0)
VLDL: 19 mg/dL (ref 0.0–40.0)

## 2017-10-21 LAB — HEMOGLOBIN A1C: Hgb A1c MFr Bld: 6.1 % (ref 4.6–6.5)

## 2017-10-21 LAB — VITAMIN D 25 HYDROXY (VIT D DEFICIENCY, FRACTURES): VITD: 47.29 ng/mL (ref 30.00–100.00)

## 2017-10-21 MED ORDER — CHOLECALCIFEROL 50 MCG (2000 UT) PO TABS: 1.0000 | ORAL_TABLET | Freq: Every day | ORAL | 1 refills | Status: DC

## 2017-10-21 MED ORDER — CHLORTHALIDONE 25 MG PO TABS: 25.0000 mg | ORAL_TABLET | Freq: Every day | ORAL | 1 refills | Status: DC

## 2017-10-21 NOTE — Patient Instructions (Signed)

## 2017-10-21 NOTE — Progress Notes (Signed)
Subjective:  Patient ID: Kiara Holt, female    DOB: [birth year]  Age: 67 y.o. MRN: [id number]  CC: Annual Exam and Hypertension   HPI Kiara Holt presents for a CPX.  She tells me that her blood pressure has not been well controlled.  She denies any recent episodes of headache, blurred vision, chest pain, shortness of breath, palpitations, edema, or fatigue.  No outpatient medications prior to visit.   No facility-administered medications prior to visit.     ROS Review of Systems  Constitutional: Negative.  Negative for appetite change, diaphoresis, fatigue and unexpected weight change.  HENT: Negative.   Eyes: Negative for photophobia and visual disturbance.  Respiratory: Negative for cough, chest tightness, shortness of breath and wheezing.   Cardiovascular: Negative for chest pain, palpitations and leg swelling.  Gastrointestinal: Negative for abdominal pain, constipation, diarrhea, nausea and vomiting.  Endocrine: Negative.   Genitourinary: Negative.  Negative for difficulty urinating.  Musculoskeletal: Negative.  Negative for arthralgias, back pain, myalgias and neck pain.  Skin: Negative.  Negative for color change and pallor.  Allergic/Immunologic: Negative.   Neurological: Negative.  Negative for dizziness, weakness, light-headedness, numbness and headaches.  Hematological: Negative for adenopathy. Does not bruise/bleed easily.  Psychiatric/Behavioral: Negative.     Objective:  BP (!) 164/84 (BP Location: Left Arm, Patient Position: Sitting, Cuff Size: Large)   Pulse 86   Temp 98.8 F (37.1 C) (Oral)   Resp 16   Ht 5\' 6"  (1.676 m)   Wt 181 lb 8 oz (82.3 kg)   SpO2 97%   BMI 29.29 kg/m   BP Readings from Last 3 Encounters:  10/21/17 (!) 164/84  08/20/16 (!) 150/90  02/17/16 (!) 144/82    Wt Readings from Last 3 Encounters:  10/21/17 181 lb 8 oz (82.3 kg)  08/20/16 173 lb 4 oz (78.6 kg)  02/17/16 172 lb 4 oz (78.1 kg)    Physical Exam    Constitutional: She is oriented to person, place, and time. No distress.  HENT:  Mouth/Throat: Oropharynx is clear and moist. No oropharyngeal exudate.  Eyes: Conjunctivae are normal. Left eye exhibits no discharge. No scleral icterus.  Neck: Normal range of motion. Neck supple. No JVD present. No thyromegaly present.  Cardiovascular: Normal rate, regular rhythm and normal heart sounds. Exam reveals no gallop and no friction rub.  No murmur heard. Pulmonary/Chest: Effort normal and breath sounds normal. No respiratory distress. She has no wheezes. She has no rales.  Abdominal: Soft. Bowel sounds are normal. She exhibits no distension and no mass. There is no tenderness. There is no rebound and no guarding.  Musculoskeletal: Normal range of motion. She exhibits no edema, tenderness or deformity.  Lymphadenopathy:    She has no cervical adenopathy.  Neurological: She is oriented to person, place, and time. She has normal reflexes.  Skin: Skin is warm and dry. No rash noted. She is not diaphoretic. No erythema. No pallor.  Psychiatric: She has a normal mood and affect. Her behavior is normal. Judgment and thought content normal.  Vitals reviewed.   Lab Results  Component Value Date   WBC 4.6 10/21/2017   HGB 12.8 10/21/2017   HCT 39.4 10/21/2017   PLT 203.0 10/21/2017   GLUCOSE 115 (H) 10/21/2017   CHOL 178 10/21/2017   TRIG 95.0 10/21/2017   HDL 53.50 10/21/2017   LDLDIRECT 137.1 11/17/2008   LDLCALC 106 (H) 10/21/2017   ALT 17 10/21/2017   AST 21 10/21/2017   NA 142  10/21/2017   K 4.3 10/21/2017   CL 101 10/21/2017   CREATININE 0.81 10/21/2017   BUN 12 10/21/2017   CO2 32 10/21/2017   TSH 1.87 10/21/2017   HGBA1C 6.1 10/21/2017    Mm Screening Breast Tomo Bilateral  Result Date: 05/03/2017 CLINICAL DATA:  Screening. EXAM: 2D DIGITAL SCREENING BILATERAL MAMMOGRAM WITH CAD AND ADJUNCT TOMO COMPARISON:  Previous exam(s). ACR Breast Density Category c: The breast tissue is  heterogeneously dense, which may obscure small masses. FINDINGS: There are no findings suspicious for malignancy. Images were processed with CAD. IMPRESSION: No mammographic evidence of malignancy. A result letter of this screening mammogram will be mailed directly to the patient. RECOMMENDATION: Screening mammogram in one year. (Code:SM-B-01Y) BI-RADS CATEGORY  1: Negative. Electronically Signed   By: Sherian ReinWei-Chen  Lin M.D.   On: 05/03/2017 12:33    Assessment & Plan:   Kiara Holt was seen today for annual exam and hypertension.  Diagnoses and all orders for this visit:  Essential hypertension, benign- Her blood pressure is not adequately well controlled.  Her lab work is negative for secondary causes or endorgan damage.  I have asked her to start taking a thiazide diuretic to control her blood pressure. -     Comprehensive metabolic panel; Future -     CBC with Differential/Platelet; Future -     Thyroid Panel With TSH; Future -     Urinalysis, Routine w reflex microscopic; Future -     VITAMIN D 25 Hydroxy (Vit-D Deficiency, Fractures); Future -     chlorthalidone (HYGROTON) 25 MG tablet; Take 1 tablet (25 mg total) by mouth daily.   Hyperglycemia- Her A1c is up to 6.1%.  She is prediabetic.  Medical therapy is not indicated.  She agrees to be more compliant with her lifestyle modifications. -     Comprehensive metabolic panel; Future -     Hemoglobin A1c; Future  Routine general medical examination at a health care facility- Exam completed, labs reviewed, she refused a flu vaccine, screening for colon cancer is up-to-date, Pap and mammogram are up-to-date, patient education material was given. -     Lipid panel; Future  Vitamin D deficiency -     Cholecalciferol 2000 units TABS; Take 1 tablet (2,000 Units total) by mouth daily.   I am having Kiara Holt start on chlorthalidone and Cholecalciferol.  Meds ordered this encounter  Medications  . chlorthalidone (HYGROTON) 25 MG tablet      Sig: Take 1 tablet (25 mg total) by mouth daily.    Dispense:  90 tablet    Refill:  1  . Cholecalciferol 2000 units TABS    Sig: Take 1 tablet (2,000 Units total) by mouth daily.    Dispense:  90 tablet    Refill:  1     Follow-up: Return in about 3 months (around 01/21/2018).  Sanda Lingerhomas Abrielle Finck, MD

## 2017-10-22 ENCOUNTER — Encounter: Payer: Self-pay | Admitting: Internal Medicine

## 2018-03-17 ENCOUNTER — Ambulatory Visit: Payer: Medicare Other | Admitting: Internal Medicine

## 2018-03-17 ENCOUNTER — Encounter: Payer: Self-pay | Admitting: Internal Medicine

## 2018-03-17 ENCOUNTER — Other Ambulatory Visit (INDEPENDENT_AMBULATORY_CARE_PROVIDER_SITE_OTHER): Payer: Medicare Other

## 2018-03-17 ENCOUNTER — Ambulatory Visit (INDEPENDENT_AMBULATORY_CARE_PROVIDER_SITE_OTHER)
Admission: RE | Admit: 2018-03-17 | Discharge: 2018-03-17 | Disposition: A | Discharge status: Home / Self Care | Payer: Medicare Other | Source: Ambulatory Visit | Attending: Internal Medicine | Admitting: Internal Medicine

## 2018-03-17 VITALS — BP 180/90 | HR 103 | Temp 98.8°F | Resp 16 | Ht 66.0 in | Wt 178.2 lb

## 2018-03-17 DIAGNOSIS — K2100 Gastro-esophageal reflux disease with esophagitis, without bleeding: Secondary | ICD-10-CM

## 2018-03-17 DIAGNOSIS — I10 Essential (primary) hypertension: Secondary | ICD-10-CM | POA: Diagnosis not present

## 2018-03-17 DIAGNOSIS — E559 Vitamin D deficiency, unspecified: Secondary | ICD-10-CM | POA: Diagnosis not present

## 2018-03-17 DIAGNOSIS — R1319 Other dysphagia: Secondary | ICD-10-CM

## 2018-03-17 DIAGNOSIS — R1013 Epigastric pain: Secondary | ICD-10-CM

## 2018-03-17 DIAGNOSIS — R1310 Dysphagia, unspecified: Secondary | ICD-10-CM | POA: Diagnosis not present

## 2018-03-17 LAB — CBC WITH DIFFERENTIAL/PLATELET
BASOS ABS: 0.1 10*3/uL (ref 0.0–0.1)
Basophils Relative: 0.8 % (ref 0.0–3.0)
Eosinophils Absolute: 0.1 10*3/uL (ref 0.0–0.7)
Eosinophils Relative: 0.9 % (ref 0.0–5.0)
HCT: 39.1 % (ref 36.0–46.0)
HEMOGLOBIN: 12.9 g/dL (ref 12.0–15.0)
LYMPHS ABS: 1.3 10*3/uL (ref 0.7–4.0)
Lymphocytes Relative: 18.7 % (ref 12.0–46.0)
MCHC: 32.9 g/dL (ref 30.0–36.0)
MCV: 83.5 fl (ref 78.0–100.0)
MONO ABS: 0.7 10*3/uL (ref 0.1–1.0)
MONOS PCT: 10.4 % (ref 3.0–12.0)
NEUTROS PCT: 69.2 % (ref 43.0–77.0)
Neutro Abs: 4.9 10*3/uL (ref 1.4–7.7)
Platelets: 200 10*3/uL (ref 150.0–400.0)
RBC: 4.69 Mil/uL (ref 3.87–5.11)
RDW: 14.7 % (ref 11.5–15.5)
WBC: 7 10*3/uL (ref 4.0–10.5)

## 2018-03-17 LAB — URINALYSIS, ROUTINE W REFLEX MICROSCOPIC
KETONES UR: 40 — AB
Nitrite: NEGATIVE
PH: 6 (ref 5.0–8.0)
SPECIFIC GRAVITY, URINE: 1.02 (ref 1.000–1.030)
TOTAL PROTEIN, URINE-UPE24: NEGATIVE
URINE GLUCOSE: NEGATIVE
UROBILINOGEN UA: 0.2 (ref 0.0–1.0)

## 2018-03-17 LAB — LIPASE

## 2018-03-17 LAB — COMPREHENSIVE METABOLIC PANEL
ALBUMIN: 4.3 g/dL (ref 3.5–5.2)
ALT: 14 U/L (ref 0–35)
AST: 16 U/L (ref 0–37)
Alkaline Phosphatase: 66 U/L (ref 39–117)
BUN: 12 mg/dL (ref 6–23)
CALCIUM: 10.1 mg/dL (ref 8.4–10.5)
CHLORIDE: 101 meq/L (ref 96–112)
CO2: 30 mEq/L (ref 19–32)
Creatinine, Ser: 0.86 mg/dL (ref 0.40–1.20)
GFR: 85.13 mL/min (ref >60.00)
Glucose, Bld: 116 mg/dL — ABNORMAL HIGH (ref 70–99)
Potassium: 3.5 mEq/L (ref 3.5–5.1)
Sodium: 139 mEq/L (ref 135–145)
Total Bilirubin: 0.7 mg/dL (ref 0.2–1.2)
Total Protein: 7.8 g/dL (ref 6.0–8.3)

## 2018-03-17 LAB — VITAMIN D 25 HYDROXY (VIT D DEFICIENCY, FRACTURES): VITD: 43.8 ng/mL (ref 30.00–100.00)

## 2018-03-17 LAB — AMYLASE: AMYLASE: 30 U/L (ref 27–131)

## 2018-03-17 NOTE — Patient Instructions (Signed)

## 2018-03-17 NOTE — Progress Notes (Signed)
Subjective:  Patient ID: Kiara Holt, female    DOB: 12-03-1950  Age: 67 y.o. MRN: [id number]  CC: Abdominal Pain and Hypertension   HPI Kiara LintLouise B Holt presents for f/up -she complains of a 2-week history of pain in her right upper quadrant and epigastric region that is exacerbated by eating.  She has a hard time describing the pain but uses the word aching and squeezing.  She also has mild dysphasia feeling like food is getting stuck in the lower part of her esophagus.  She denies odynophagia, loss of appetite, or weight loss.  She is tried to control her symptoms with baking soda and Gas-X but has not gotten any symptom relief.  She complains of a mild decrease in appetite but has not lost any weight.  She denies nausea, vomiting, melena, diarrhea, or constipation.  She took a dose of Aleve 3 weeks ago but otherwise does not have any risk factors for peptic ulcer disease as she does not drink or smoke.  She complains of mild heartburn.  She is not treating the heartburn.  Outpatient Medications Prior to Visit  Medication Sig Dispense Refill  . chlorthalidone (HYGROTON) 25 MG tablet Take 1 tablet (25 mg total) by mouth daily. (Patient not taking: Reported on 03/17/2018) 90 tablet 1  . Cholecalciferol 2000 units TABS Take 1 tablet (2,000 Units total) by mouth daily. (Patient not taking: Reported on 03/17/2018) 90 tablet 1   No facility-administered medications prior to visit.     ROS Review of Systems  Constitutional: Positive for appetite change. Negative for diaphoresis and fatigue.  HENT: Positive for trouble swallowing. Negative for sore throat and voice change.   Respiratory: Negative.  Negative for cough, chest tightness, shortness of breath and wheezing.   Cardiovascular: Negative for chest pain, palpitations and leg swelling.  Gastrointestinal: Positive for abdominal pain. Negative for blood in stool, constipation, diarrhea, nausea and vomiting.  Endocrine: Negative.     Genitourinary: Negative.  Negative for decreased urine volume, difficulty urinating, dysuria, flank pain, hematuria and urgency.  Musculoskeletal: Negative.  Negative for arthralgias and myalgias.  Skin: Negative.  Negative for color change.  Neurological: Negative.  Negative for dizziness and weakness.  Hematological: Negative for adenopathy. Does not bruise/bleed easily.  Psychiatric/Behavioral: Negative.     Objective:  BP (!) 180/90 (BP Location: Left Arm, Patient Position: Sitting, Cuff Size: Large)   Pulse (!) 103   Temp 98.8 F (37.1 C) (Oral)   Resp 16   Ht 5\' 6"  (1.676 m)   Wt 178 lb 4 oz (80.9 kg)   SpO2 93%   BMI 28.77 kg/m   BP Readings from Last 3 Encounters:  03/17/18 (!) 180/90  10/21/17 (!) 164/84  08/20/16 (!) 150/90    Wt Readings from Last 3 Encounters:  03/17/18 178 lb 4 oz (80.9 kg)  10/21/17 181 lb 8 oz (82.3 kg)  08/20/16 173 lb 4 oz (78.6 kg)    Physical Exam  Constitutional: She is oriented to person, place, and time. No distress.  HENT:  Mouth/Throat: Oropharynx is clear and moist. No oropharyngeal exudate.  Eyes: No scleral icterus.  Neck: Normal range of motion. Neck supple. No thyromegaly present.  Cardiovascular: Normal rate, regular rhythm, normal heart sounds and intact distal pulses. Exam reveals no gallop.  No murmur heard. Pulmonary/Chest: Effort normal and breath sounds normal. No respiratory distress. She has no wheezes. She has no rhonchi. She has no rales.  Abdominal: Normal appearance, normal aorta and bowel  sounds are normal. She exhibits no distension. There is no hepatosplenomegaly. There is tenderness in the right upper quadrant and epigastric area. There is positive Murphy's sign. There is no rigidity, no guarding, no CVA tenderness and no tenderness at McBurney's point. No hernia.  Musculoskeletal: Normal range of motion. She exhibits no edema, tenderness or deformity.  Neurological: She is alert and oriented to person, place,  and time.  Skin: Skin is warm and dry. She is not diaphoretic. No pallor.  Vitals reviewed.   Lab Results  Component Value Date   WBC 7.0 03/17/2018   HGB 12.9 03/17/2018   HCT 39.1 03/17/2018   PLT 200.0 03/17/2018   GLUCOSE 116 (H) 03/17/2018   CHOL 178 10/21/2017   TRIG 95.0 10/21/2017   HDL 53.50 10/21/2017   LDLDIRECT 137.1 11/17/2008   LDLCALC 106 (H) 10/21/2017   ALT 14 03/17/2018   AST 16 03/17/2018   NA 139 03/17/2018   K 3.5 03/17/2018   CL 101 03/17/2018   CREATININE 0.86 03/17/2018   BUN 12 03/17/2018   CO2 30 03/17/2018   TSH 1.87 10/21/2017   HGBA1C 6.1 10/21/2017    Mm Screening Breast Tomo Bilateral  Result Date: 05/03/2017 CLINICAL DATA:  Screening. EXAM: 2D DIGITAL SCREENING BILATERAL MAMMOGRAM WITH CAD AND ADJUNCT TOMO COMPARISON:  Previous exam(s). ACR Breast Density Category c: The breast tissue is heterogeneously dense, which may obscure small masses. FINDINGS: There are no findings suspicious for malignancy. Images were processed with CAD. IMPRESSION: No mammographic evidence of malignancy. A result letter of this screening mammogram will be mailed directly to the patient. RECOMMENDATION: Screening mammogram in one year. (Code:SM-B-01Y) BI-RADS CATEGORY  1: Negative. Electronically Signed   By: Sherian ReinWei-Chen  Lin M.D.   On: 05/03/2017 12:33   Dg Abd Acute W/chest  Result Date: 03/17/2018 CLINICAL DATA:  Epigastric pain for 2 weeks. EXAM: DG ABDOMEN ACUTE W/ 1V CHEST COMPARISON:  None. FINDINGS: There is no evidence of dilated bowel loops or free intraperitoneal air. No radiopaque calculi or other significant radiographic abnormality is seen. Heart size and mediastinal contours are within normal limits. Both lungs are clear. There is scoliosis of spine. IMPRESSION: Negative abdominal radiographs. No acute cardiopulmonary disease. Electronically Signed   By: Sherian ReinWei-Chen  Lin M.D.   On: 03/17/2018 12:24    Assessment & Plan:   Kiara Holt was seen today for abdominal  pain and hypertension.  Diagnoses and all orders for this visit:  Abdominal pain, epigastric- Based on her symptoms, exam, and lab work she does not appear to have an acute abdominal process.  I am concerned about upper GI pathology so I have asked her to start a PPI and to see GI to see if she needs to undergo upper endoscopy.  I am also concerned about gallbladder disease so I have asked her to undergo an abdominal ultrasound. -     CBC with Differential/Platelet; Future -     Lipase; Future -     Amylase; Future -     Comprehensive metabolic panel; Future -     Urinalysis, Routine w reflex microscopic; Future -     DG Abd Acute W/Chest; Future -     US Abdomen Complete; Future  Essential hypertension, benign- Her blood pressure is not well controlled.  She is acutely ill today so I will defer blood pressure management until her next visit.  Vitamin D deficiency -     VITAMIN D 25 Hydroxy (Vit-D Deficiency, Fractures); Future  Esophageal dysphagia -  Ambulatory referral to Gastroenterology  GERD with esophagitis-she has minimal risk factors for peptic ulcer disease but does have upper GI symptoms.  I have asked her to avoid anti-inflammatories such as Aleve and to start a PPI. -     dexlansoprazole (DEXILANT) 60 MG capsule; Take 1 capsule (60 mg total) by mouth daily.   I am having Kiara Holt start on dexlansoprazole. I am also having her maintain her chlorthalidone and Cholecalciferol.  Meds ordered this encounter  Medications  . dexlansoprazole (DEXILANT) 60 MG capsule    Sig: Take 1 capsule (60 mg total) by mouth daily.    Dispense:  90 capsule    Refill:  1     Follow-up: Return in about 1 week (around 03/24/2018).  Sanda Linger, MD

## 2018-03-18 ENCOUNTER — Encounter: Payer: Self-pay | Admitting: Internal Medicine

## 2018-03-18 DIAGNOSIS — K2100 Gastro-esophageal reflux disease with esophagitis, without bleeding: Secondary | ICD-10-CM | POA: Insufficient documentation

## 2018-03-18 DIAGNOSIS — R1310 Dysphagia, unspecified: Secondary | ICD-10-CM | POA: Insufficient documentation

## 2018-03-18 DIAGNOSIS — R1319 Other dysphagia: Secondary | ICD-10-CM | POA: Insufficient documentation

## 2018-03-18 MED ORDER — DEXLANSOPRAZOLE 60 MG PO CPDR
60.0000 mg | DELAYED_RELEASE_CAPSULE | Freq: Every day | ORAL | 1 refills | Status: DC
Start: 2018-03-18 — End: 2018-10-27

## 2018-03-18 NOTE — Addendum Note (Signed)
Addended by: Scarlett PrestoFRIEDENBACH, Elnor Renovato on: 03/18/2018 10:21 AM   Modules accepted: Orders

## 2018-03-19 ENCOUNTER — Encounter: Payer: Self-pay | Admitting: Internal Medicine

## 2018-03-19 ENCOUNTER — Ambulatory Visit (HOSPITAL_COMMUNITY)
Admission: RE | Admit: 2018-03-19 | Discharge: 2018-03-19 | Disposition: A | Discharge status: Home / Self Care | Payer: Medicare Other | Source: Ambulatory Visit | Attending: Internal Medicine | Admitting: Internal Medicine

## 2018-03-19 ENCOUNTER — Other Ambulatory Visit: Payer: Self-pay | Admitting: Internal Medicine

## 2018-03-19 DIAGNOSIS — K8020 Calculus of gallbladder without cholecystitis without obstruction: Secondary | ICD-10-CM | POA: Insufficient documentation

## 2018-03-19 DIAGNOSIS — R1013 Epigastric pain: Secondary | ICD-10-CM | POA: Diagnosis present

## 2018-04-14 ENCOUNTER — Ambulatory Visit: Payer: Medicare Other | Admitting: Internal Medicine

## 2018-06-23 ENCOUNTER — Other Ambulatory Visit: Payer: Self-pay | Admitting: Internal Medicine

## 2018-06-23 DIAGNOSIS — Z1231 Encounter for screening mammogram for malignant neoplasm of breast: Secondary | ICD-10-CM

## 2018-07-18 ENCOUNTER — Ambulatory Visit
Admission: RE | Admit: 2018-07-18 | Discharge: 2018-07-18 | Disposition: A | Discharge status: Home / Self Care | Payer: Medicare Other | Source: Ambulatory Visit | Attending: Internal Medicine | Admitting: Internal Medicine

## 2018-07-18 DIAGNOSIS — Z1231 Encounter for screening mammogram for malignant neoplasm of breast: Secondary | ICD-10-CM

## 2018-07-21 LAB — HM MAMMOGRAPHY

## 2018-10-27 ENCOUNTER — Ambulatory Visit (INDEPENDENT_AMBULATORY_CARE_PROVIDER_SITE_OTHER): Payer: Medicare Other | Admitting: Internal Medicine

## 2018-10-27 ENCOUNTER — Encounter: Payer: Self-pay | Admitting: Internal Medicine

## 2018-10-27 ENCOUNTER — Other Ambulatory Visit (INDEPENDENT_AMBULATORY_CARE_PROVIDER_SITE_OTHER): Payer: Medicare Other

## 2018-10-27 ENCOUNTER — Other Ambulatory Visit: Payer: Self-pay

## 2018-10-27 VITALS — BP 172/94 | HR 104 | Temp 98.1°F | Resp 16 | Ht 66.0 in | Wt 179.0 lb

## 2018-10-27 DIAGNOSIS — Z0001 Encounter for general adult medical examination with abnormal findings: Secondary | ICD-10-CM

## 2018-10-27 DIAGNOSIS — R7303 Prediabetes: Secondary | ICD-10-CM

## 2018-10-27 DIAGNOSIS — I10 Essential (primary) hypertension: Secondary | ICD-10-CM

## 2018-10-27 DIAGNOSIS — E785 Hyperlipidemia, unspecified: Secondary | ICD-10-CM

## 2018-10-27 DIAGNOSIS — E559 Vitamin D deficiency, unspecified: Secondary | ICD-10-CM

## 2018-10-27 DIAGNOSIS — Z8639 Personal history of other endocrine, nutritional and metabolic disease: Secondary | ICD-10-CM | POA: Diagnosis not present

## 2018-10-27 DIAGNOSIS — Z0000 Encounter for general adult medical examination without abnormal findings: Secondary | ICD-10-CM

## 2018-10-27 DIAGNOSIS — Z9114 Patient's other noncompliance with medication regimen: Secondary | ICD-10-CM | POA: Diagnosis not present

## 2018-10-27 LAB — URINALYSIS, ROUTINE W REFLEX MICROSCOPIC
Bilirubin Urine: NEGATIVE
Hgb urine dipstick: NEGATIVE
Ketones, ur: NEGATIVE
Nitrite: NEGATIVE
RBC / HPF: NONE SEEN (ref 0–?)
Specific Gravity, Urine: 1.02 (ref 1.000–1.030)
Total Protein, Urine: NEGATIVE
Urine Glucose: NEGATIVE
Urobilinogen, UA: 0.2 (ref 0.0–1.0)
pH: 7 (ref 5.0–8.0)

## 2018-10-27 LAB — CBC WITH DIFFERENTIAL/PLATELET
Basophils Absolute: 0 10*3/uL (ref 0.0–0.1)
Basophils Relative: 0.5 % (ref 0.0–3.0)
Eosinophils Absolute: 0 10*3/uL (ref 0.0–0.7)
Eosinophils Relative: 1.1 % (ref 0.0–5.0)
HCT: 38.4 % (ref 36.0–46.0)
Hemoglobin: 12.8 g/dL (ref 12.0–15.0)
LYMPHS ABS: 1.2 10*3/uL (ref 0.7–4.0)
Lymphocytes Relative: 27.4 % (ref 12.0–46.0)
MCHC: 33.4 g/dL (ref 30.0–36.0)
MCV: 83.4 fl (ref 78.0–100.0)
MONO ABS: 0.7 10*3/uL (ref 0.1–1.0)
Monocytes Relative: 15 % — ABNORMAL HIGH (ref 3.0–12.0)
Neutro Abs: 2.5 10*3/uL (ref 1.4–7.7)
Neutrophils Relative %: 56 % (ref 43.0–77.0)
Platelets: 193 10*3/uL (ref 150.0–400.0)
RBC: 4.61 Mil/uL (ref 3.87–5.11)
RDW: 15 % (ref 11.5–15.5)
WBC: 4.4 10*3/uL (ref 4.0–10.5)

## 2018-10-27 LAB — COMPREHENSIVE METABOLIC PANEL
ALK PHOS: 64 U/L (ref 39–117)
ALT: 16 U/L (ref 0–35)
AST: 18 U/L (ref 0–37)
Albumin: 4.3 g/dL (ref 3.5–5.2)
BUN: 10 mg/dL (ref 6–23)
CO2: 29 mEq/L (ref 19–32)
Calcium: 9.8 mg/dL (ref 8.4–10.5)
Chloride: 101 mEq/L (ref 96–112)
Creatinine, Ser: 0.74 mg/dL (ref 0.40–1.20)
GFR: 95.08 mL/min (ref >60.00)
Glucose, Bld: 112 mg/dL — ABNORMAL HIGH (ref 70–99)
POTASSIUM: 3.9 meq/L (ref 3.5–5.1)
Sodium: 139 mEq/L (ref 135–145)
Total Bilirubin: 0.5 mg/dL (ref 0.2–1.2)
Total Protein: 7.6 g/dL (ref 6.0–8.3)

## 2018-10-27 LAB — LIPID PANEL
Cholesterol: 189 mg/dL (ref 0–200)
HDL: 45.8 mg/dL (ref >39.00)
LDL Cholesterol: 118 mg/dL — ABNORMAL HIGH (ref 0–99)
NonHDL: 143.53
Total CHOL/HDL Ratio: 4
Triglycerides: 127 mg/dL (ref 0.0–149.0)
VLDL: 25.4 mg/dL (ref 0.0–40.0)

## 2018-10-27 LAB — VITAMIN D 25 HYDROXY (VIT D DEFICIENCY, FRACTURES): VITD: 39 ng/mL (ref 30.00–100.00)

## 2018-10-27 LAB — HEMOGLOBIN A1C: Hgb A1c MFr Bld: 6.2 % (ref 4.6–6.5)

## 2018-10-27 LAB — TSH: TSH: 2.19 u[IU]/mL (ref 0.35–4.50)

## 2018-10-27 MED ORDER — TRIAMTERENE-HCTZ 37.5-25 MG PO CAPS: 1.0000 | ORAL_CAPSULE | Freq: Every day | ORAL | 0 refills | Status: DC

## 2018-10-27 MED ORDER — ROSUVASTATIN CALCIUM 10 MG PO TABS: 10.0000 mg | ORAL_TABLET | Freq: Every day | ORAL | 1 refills | Status: DC

## 2018-10-27 NOTE — Progress Notes (Signed)
Subjective:  Patient ID: Kiara Holt, female    DOB: Jun 06, 1951  Age: 68 y.o. MRN: [id number]  CC: Hypertension; Annual Exam; and Hyperlipidemia   HPI Kiara Holt presents for a CPX.  She does not monitor her blood pressure and is not taking her antihypertensive because she did not know she was supposed to be taking one.  She is active and denies any recent episodes of CP or DOE.  Past Medical History:  Diagnosis Date   Arthritis    shoulder   Past Surgical History:  Procedure Laterality Date   ABDOMINAL HYSTERECTOMY  1972   COLONOSCOPY      reports that she has never smoked. She has never used smokeless tobacco. She reports that she does not drink alcohol or use drugs. family history includes Arthritis in her mother; Breast cancer in her maternal grandmother; Diabetes in her mother; Kidney disease in her mother. No Known Allergies  Outpatient Medications Prior to Visit  Medication Sig Dispense Refill   Cholecalciferol 2000 units TABS Take 1 tablet (2,000 Units total) by mouth daily. (Patient not taking: Reported on 03/17/2018) 90 tablet 1   chlorthalidone (HYGROTON) 25 MG tablet Take 1 tablet (25 mg total) by mouth daily. (Patient not taking: Reported on 03/17/2018) 90 tablet 1   dexlansoprazole (DEXILANT) 60 MG capsule Take 1 capsule (60 mg total) by mouth daily. (Patient not taking: Reported on 10/27/2018) 90 capsule 1   No facility-administered medications prior to visit.     ROS Review of Systems  Constitutional: Negative.  Negative for diaphoresis, fatigue, fever and unexpected weight change.  HENT: Negative.   Eyes: Negative for visual disturbance.  Respiratory: Negative for cough, chest tightness, shortness of breath and wheezing.   Cardiovascular: Negative for chest pain, palpitations and leg swelling.  Gastrointestinal: Negative for abdominal pain, constipation, diarrhea, nausea and vomiting.  Endocrine: Negative.   Genitourinary: Negative.   Negative for difficulty urinating, dysuria and urgency.  Musculoskeletal: Negative.  Negative for arthralgias and myalgias.  Skin: Negative.  Negative for color change and rash.  Neurological: Negative.  Negative for dizziness, speech difficulty, weakness, light-headedness, numbness and headaches.  Hematological: Negative for adenopathy. Does not bruise/bleed easily.  Psychiatric/Behavioral: Negative.     Objective:  BP (!) 172/94 (BP Location: Left Arm, Patient Position: Sitting, Cuff Size: Normal)    Pulse (!) 104    Temp 98.1 F (36.7 C) (Oral)    Resp 16    Ht 5\' 6"  (1.676 m)    Wt 179 lb (81.2 kg)    SpO2 98%    BMI 28.89 kg/m   BP Readings from Last 3 Encounters:  10/27/18 (!) 172/94  03/17/18 (!) 180/90  10/21/17 (!) 164/84    Wt Readings from Last 3 Encounters:  10/27/18 179 lb (81.2 kg)  03/17/18 178 lb 4 oz (80.9 kg)  10/21/17 181 lb 8 oz (82.3 kg)    Physical Exam Vitals signs reviewed.  HENT:     Nose: Nose normal. No congestion.     Mouth/Throat:     Mouth: Mucous membranes are moist.     Pharynx: Oropharynx is clear. No oropharyngeal exudate.  Eyes:     General: No scleral icterus.    Conjunctiva/sclera: Conjunctivae normal.  Neck:     Musculoskeletal: Normal range of motion and neck supple. No muscular tenderness.  Cardiovascular:     Rate and Rhythm: Normal rate and regular rhythm.     Heart sounds: No murmur. No gallop.  Comments: EKG ---  Sinus  Rhythm  WITHIN NORMAL LIMITS  Pulmonary:     Effort: Pulmonary effort is normal.     Breath sounds: No stridor. No wheezing, rhonchi or rales.  Abdominal:     General: Bowel sounds are normal.     Palpations: There is no hepatomegaly, splenomegaly or mass.     Tenderness: There is no abdominal tenderness. There is no guarding.  Musculoskeletal: Normal range of motion.        General: No swelling.     Right lower leg: No edema.     Left lower leg: No edema.  Lymphadenopathy:     Cervical: No  cervical adenopathy.  Skin:    General: Skin is warm.  Neurological:     General: No focal deficit present.     Mental Status: Mental status is at baseline.  Psychiatric:        Mood and Affect: Mood normal.        Behavior: Behavior normal.        Thought Content: Thought content normal.        Judgment: Judgment normal.     Lab Results  Component Value Date   WBC 4.4 10/27/2018   HGB 12.8 10/27/2018   HCT 38.4 10/27/2018   PLT 193.0 10/27/2018   GLUCOSE 112 (H) 10/27/2018   CHOL 189 10/27/2018   TRIG 127.0 10/27/2018   HDL 45.80 10/27/2018   LDLDIRECT 137.1 11/17/2008   LDLCALC 118 (H) 10/27/2018   ALT 16 10/27/2018   AST 18 10/27/2018   NA 139 10/27/2018   K 3.9 10/27/2018   CL 101 10/27/2018   CREATININE 0.74 10/27/2018   BUN 10 10/27/2018   CO2 29 10/27/2018   TSH 2.19 10/27/2018   HGBA1C 6.2 10/27/2018    Visual Acuity Screening   Right eye Left eye Both eyes  Without correction: 20/50 20/40 20/25   With correction:     Hearing Screening Comments: Passed whisper test   Mm 3d Screen Breast Bilateral  Result Date: 07/21/2018 CLINICAL DATA:  Screening. EXAM: DIGITAL SCREENING BILATERAL MAMMOGRAM WITH TOMO AND CAD COMPARISON:  Previous exam(s). ACR Breast Density Category c: The breast tissue is heterogeneously dense, which may obscure small masses. FINDINGS: There are no findings suspicious for malignancy. Images were processed with CAD. IMPRESSION: No mammographic evidence of malignancy. A result letter of this screening mammogram will be mailed directly to the patient. RECOMMENDATION: Screening mammogram in one year. (Code:SM-B-01Y) BI-RADS CATEGORY  1: Negative. Electronically Signed   By: Annia Belt M.D.   On: 07/21/2018 09:57    Assessment & Plan:   Kiara Holt was seen today for hypertension, annual exam and hyperlipidemia.  Diagnoses and all orders for this visit:  Essential hypertension, benign- Her blood pressure is not adequately well controlled due to  noncompliance.  She has a history of mild hypokalemia so I have asked her to start taking the combination of a thiazide diuretic and a potassium sparing diuretic.  Her labs are negative for secondary causes or endorgan damage and her EKG is negative for LVH or ischemia. -     CBC with Differential/Platelet; Future -     Comprehensive metabolic panel; Future -     TSH; Future -     Urinalysis, Routine w reflex microscopic; Future -     EKG 12-Lead -     triamterene-hydrochlorothiazide (DYAZIDE) 37.5-25 MG capsule; Take 1 each (1 capsule total) by mouth daily.  Prediabetes- Her A1c is at 6.2%.  Medical therapy is not indicated at this time. -     Comprehensive metabolic panel; Future -     Hemoglobin A1c; Future  Vitamin D deficiency- Her vitamin D level is normal now. -     VITAMIN D 25 Hydroxy (Vit-D Deficiency, Fractures); Future  Hyperlipidemia LDL goal <130- Her ASCVD risk score is above 15% so I have asked her to start taking a statin for CV risk reduction. -     Lipid panel; Future -     TSH; Future -     rosuvastatin (CRESTOR) 10 MG tablet; Take 1 tablet (10 mg total) by mouth daily.  Routine general medical examination at a health care facility   I have discontinued Breyana B. Herrmann's chlorthalidone and dexlansoprazole. I am also having her start on triamterene-hydrochlorothiazide and rosuvastatin. Additionally, I am having her maintain her Cholecalciferol.  Meds ordered this encounter  Medications   triamterene-hydrochlorothiazide (DYAZIDE) 37.5-25 MG capsule    Sig: Take 1 each (1 capsule total) by mouth daily.    Dispense:  90 capsule    Refill:  0   rosuvastatin (CRESTOR) 10 MG tablet    Sig: Take 1 tablet (10 mg total) by mouth daily.    Dispense:  90 tablet    Refill:  1   See AVS for instructions about healthy living and anticipatory guidance.  Follow-up: Return in about 2 months (around 12/27/2018).  Sanda Linger, MD

## 2018-10-27 NOTE — Assessment & Plan Note (Signed)
We discussed advanced care planning, living well, and advanced directives.  She wished not to make any decisions on these at this time.  The written screening recommendations is given to patient and attached in the patent instructions or AVS.   The patient is here for annual Medicare wellness examination and management of other chronic and acute problems.   The risk factors are reflected in the social history.  The roster of all physicians providing medical care to patient - is listed in the Snapshot section of the chart.  Activities of daily living:  The patient is 100% inedpendent in all ADLs: dressing, toileting, feeding as well as independent mobility  Home safety : The patient has smoke detectors in the home. They wear seatbelts.No firearms at home ( firearms are present in the home, kept in a safe fashion). There is no violence in the home.   There is no risks for hepatitis, STDs or HIV. There is no   history of blood transfusion. They have no travel history to infectious disease endemic areas of the world.  The patient has (has not) seen their dentist in the last six month. They have (not) seen their eye doctor in the last year. They deny (admit to) any hearing difficulty and have not had audiologic testing in the last year.  They do not  have excessive sun exposure. Discussed the need for sun protection: hats, long sleeves and use of sunscreen if there is significant sun exposure.   Diet: the importance of a healthy diet is discussed. They do have a healthy (unhealthy-high fat/fast food) diet.  The patient has a regular exercise program.  The benefits of regular aerobic exercise were discussed.  Depression screen: there are no signs or vegative symptoms of depression- irritability, change in appetite, anhedonia, sadness/tearfullness.  Cognitive assessment: the patient manages all their financial and personal affairs and is actively engaged. They could relate day,date,year and events;  recalled 3/3 objects at 3 minutes; performed clock-face test normally.  The following portions of the patient's history were reviewed and updated as appropriate: allergies, current medications, past family history, past medical history,  past surgical history, past social history  and problem list.  Vision, hearing, body mass index were assessed and reviewed.   During the course of the visit the patient was educated and counseled about appropriate screening and preventive services including : fall prevention , diabetes screening, nutrition counseling, colorectal cancer screening, and recommended immunizations.

## 2018-10-27 NOTE — Patient Instructions (Signed)
Preventive Care 42 Years and Older, Female Preventive care refers to lifestyle choices and visits with your health care provider that can promote health and wellness. What does preventive care include?  A yearly physical exam. This is also called an annual well check.  Dental exams once or twice a year.  Routine eye exams. Ask your health care provider how often you should have your eyes checked.  Personal lifestyle choices, including: ? Daily care of your teeth and gums. ? Regular physical activity. ? Eating a healthy diet. ? Avoiding tobacco and drug use. ? Limiting alcohol use. ? Practicing safe sex. ? Taking low-dose aspirin every day. ? Taking vitamin and mineral supplements as recommended by your health care provider. What happens during an annual well check? The services and screenings done by your health care provider during your annual well check will depend on your age, overall health, lifestyle risk factors, and family history of disease. Counseling Your health care provider may ask you questions about your:  Alcohol use.  Tobacco use.  Drug use.  Emotional well-being.  Home and relationship well-being.  Sexual activity.  Eating habits.  History of falls.  Memory and ability to understand (cognition).  Work and work Statistician.  Reproductive health.  Screening You may have the following tests or measurements:  Height, weight, and BMI.  Blood pressure.  Lipid and cholesterol levels. These may be checked every 5 years, or more frequently if you are over 30 years old.  Skin check.  Lung cancer screening. You may have this screening every year starting at age 27 if you have a 30-pack-year history of smoking and currently smoke or have quit within the past 15 years.  Colorectal cancer screening. All adults should have this screening starting at age 33 and continuing until age 46. You will have tests every 1-10 years, depending on your results and the  type of screening test. People at increased risk should start screening at an earlier age. Screening tests may include: ? Guaiac-based fecal occult blood testing. ? Fecal immunochemical test (FIT). ? Stool DNA test. ? Virtual colonoscopy. ? Sigmoidoscopy. During this test, a flexible tube with a tiny camera (sigmoidoscope) is used to examine your rectum and lower colon. The sigmoidoscope is inserted through your anus into your rectum and lower colon. ? Colonoscopy. During this test, a long, thin, flexible tube with a tiny camera (colonoscope) is used to examine your entire colon and rectum.  Hepatitis C blood test.  Hepatitis B blood test.  Sexually transmitted disease (STD) testing.  Diabetes screening. This is done by checking your blood sugar (glucose) after you have not eaten for a while (fasting). You may have this done every 1-3 years.  Bone density scan. This is done to screen for osteoporosis. You may have this done starting at age 37.  Mammogram. This may be done every 1-2 years. Talk to your health care provider about how often you should have regular mammograms. Talk with your health care provider about your test results, treatment options, and if necessary, the need for more tests. Vaccines Your health care provider may recommend certain vaccines, such as:  Influenza vaccine. This is recommended every year.  Tetanus, diphtheria, and acellular pertussis (Tdap, Td) vaccine. You may need a Td booster every 10 years.  Varicella vaccine. You may need this if you have not been vaccinated.  Zoster vaccine. You may need this after age 38.  Measles, mumps, and rubella (MMR) vaccine. You may need at least  one dose of MMR if you were born in 1957 or later. You may also need a second dose.  Pneumococcal 13-valent conjugate (PCV13) vaccine. One dose is recommended after age 24.  Pneumococcal polysaccharide (PPSV23) vaccine. One dose is recommended after age 24.  Meningococcal  vaccine. You may need this if you have certain conditions.  Hepatitis A vaccine. You may need this if you have certain conditions or if you travel or work in places where you may be exposed to hepatitis A.  Hepatitis B vaccine. You may need this if you have certain conditions or if you travel or work in places where you may be exposed to hepatitis B.  Haemophilus influenzae type b (Hib) vaccine. You may need this if you have certain conditions. Talk to your health care provider about which screenings and vaccines you need and how often you need them. This information is not intended to replace advice given to you by your health care provider. Make sure you discuss any questions you have with your health care provider. Document Released: 08/19/2015 Document Revised: 09/12/2017 Document Reviewed: 05/24/2015 Elsevier Interactive Patient Education  2019 [employer].

## 2019-06-08 ENCOUNTER — Other Ambulatory Visit: Payer: Self-pay

## 2019-06-08 ENCOUNTER — Encounter: Payer: Self-pay | Admitting: Internal Medicine

## 2019-06-08 ENCOUNTER — Ambulatory Visit (INDEPENDENT_AMBULATORY_CARE_PROVIDER_SITE_OTHER): Payer: Medicare Other | Admitting: Internal Medicine

## 2019-06-08 VITALS — BP 164/84 | HR 72 | Temp 98.4°F | Resp 16 | Ht 66.0 in | Wt 172.0 lb

## 2019-06-08 DIAGNOSIS — G5603 Carpal tunnel syndrome, bilateral upper limbs: Secondary | ICD-10-CM | POA: Insufficient documentation

## 2019-06-08 DIAGNOSIS — M79641 Pain in right hand: Secondary | ICD-10-CM

## 2019-06-08 DIAGNOSIS — I10 Essential (primary) hypertension: Secondary | ICD-10-CM | POA: Diagnosis not present

## 2019-06-08 DIAGNOSIS — E785 Hyperlipidemia, unspecified: Secondary | ICD-10-CM | POA: Diagnosis not present

## 2019-06-08 DIAGNOSIS — M79642 Pain in left hand: Secondary | ICD-10-CM | POA: Diagnosis not present

## 2019-06-08 MED ORDER — INDAPAMIDE 1.25 MG PO TABS: 1.2500 mg | ORAL_TABLET | Freq: Every day | ORAL | 0 refills | Status: DC

## 2019-06-08 MED ORDER — ROSUVASTATIN CALCIUM 10 MG PO TABS: 10.0000 mg | ORAL_TABLET | Freq: Every day | ORAL | 1 refills | Status: DC

## 2019-06-08 NOTE — Patient Instructions (Signed)

## 2019-06-08 NOTE — Progress Notes (Signed)
Subjective:  Patient ID: Kiara Holt, female    DOB: May 12, 1951  Age: 68 y.o. MRN: [id number]  CC: Hypertension   HPI Kiara LintLouise B Geng presents for f/up - She is not taking Dyazide.  When I prescribed it about 6 or 7 months ago she said she took a few doses and felt dizzy so she stopped taking it and did not contact me about treating her high blood pressure.  She complains of a 1022-month history of bilateral hand pain.  She describes it as a burning, stinging sensation that is located on the palms and radiates onto the forearms.  She denies numbness, weakness, or tingling.  She denies joint pain or joint swelling.  Outpatient Medications Prior to Visit  Medication Sig Dispense Refill  . Cholecalciferol 2000 units TABS Take 1 tablet (2,000 Units total) by mouth daily. 90 tablet 1  . rosuvastatin (CRESTOR) 10 MG tablet Take 1 tablet (10 mg total) by mouth daily. (Patient not taking: Reported on 06/08/2019) 90 tablet 1  . triamterene-hydrochlorothiazide (DYAZIDE) 37.5-25 MG capsule Take 1 each (1 capsule total) by mouth daily. (Patient not taking: Reported on 06/08/2019) 90 capsule 0   No facility-administered medications prior to visit.     ROS Review of Systems  Constitutional: Negative.  Negative for diaphoresis, fatigue and unexpected weight change.  HENT: Negative.   Eyes: Negative for visual disturbance.  Respiratory: Negative for cough, chest tightness and shortness of breath.   Cardiovascular: Negative for chest pain, palpitations and leg swelling.  Gastrointestinal: Negative for abdominal pain, constipation, diarrhea, nausea and vomiting.  Genitourinary: Negative.  Negative for difficulty urinating and dysuria.  Musculoskeletal: Positive for arthralgias. Negative for back pain, neck pain and neck stiffness.  Skin: Negative.  Negative for color change.  Neurological: Negative.  Negative for dizziness, weakness, light-headedness, numbness and headaches.  Hematological: Negative  for adenopathy. Does not bruise/bleed easily.  Psychiatric/Behavioral: Negative.     Objective:  BP (!) 164/84 (BP Location: Left Arm, Patient Position: Sitting, Cuff Size: Large)   Pulse 72   Temp 98.4 F (36.9 C) (Oral)   Resp 16   Ht 5\' 6"  (1.676 m)   Wt 172 lb (78 kg)   SpO2 97%   BMI 27.76 kg/m   BP Readings from Last 3 Encounters:  06/08/19 (!) 164/84  10/27/18 (!) 172/94  03/17/18 (!) 180/90    Wt Readings from Last 3 Encounters:  06/08/19 172 lb (78 kg)  10/27/18 179 lb (81.2 kg)  03/17/18 178 lb 4 oz (80.9 kg)    Physical Exam Vitals signs reviewed.  Constitutional:      Appearance: Normal appearance.  HENT:     Nose: Nose normal.     Mouth/Throat:     Mouth: Mucous membranes are moist.  Eyes:     General: No scleral icterus.    Conjunctiva/sclera: Conjunctivae normal.  Neck:     Musculoskeletal: Normal range of motion and neck supple.  Cardiovascular:     Rate and Rhythm: Normal rate and regular rhythm.     Heart sounds: No murmur.  Pulmonary:     Effort: Pulmonary effort is normal.     Breath sounds: No stridor. No wheezing, rhonchi or rales.  Abdominal:     General: Abdomen is flat. Bowel sounds are normal.     Palpations: There is no hepatomegaly, splenomegaly or mass.     Tenderness: There is no abdominal tenderness.  Musculoskeletal: Normal range of motion.     Right wrist:  Normal.     Left wrist: Normal.     Right hand: Normal.     Left hand: Normal.     Comments: Negative Tinel's test, positive Phalen's test bilaterally  Lymphadenopathy:     Cervical: No cervical adenopathy.  Skin:    General: Skin is warm.     Findings: No lesion or rash.  Neurological:     General: No focal deficit present.     Mental Status: She is alert.     Cranial Nerves: Cranial nerves are intact.     Sensory: Sensation is intact.     Motor: Motor function is intact.     Coordination: Coordination is intact.     Deep Tendon Reflexes:     Reflex Scores:       Tricep reflexes are 1+ on the left side.      Bicep reflexes are 1+ on the right side and 1+ on the left side.      Brachioradialis reflexes are 1+ on the right side and 1+ on the left side.      Patellar reflexes are 1+ on the right side and 1+ on the left side.      Achilles reflexes are 1+ on the right side and 1+ on the left side. Psychiatric:        Mood and Affect: Mood normal.     Lab Results  Component Value Date   WBC 4.4 10/27/2018   HGB 12.8 10/27/2018   HCT 38.4 10/27/2018   PLT 193.0 10/27/2018   GLUCOSE 112 (H) 10/27/2018   CHOL 189 10/27/2018   TRIG 127.0 10/27/2018   HDL 45.80 10/27/2018   LDLDIRECT 137.1 11/17/2008   LDLCALC 118 (H) 10/27/2018   ALT 16 10/27/2018   AST 18 10/27/2018   NA 139 10/27/2018   K 3.9 10/27/2018   CL 101 10/27/2018   CREATININE 0.74 10/27/2018   BUN 10 10/27/2018   CO2 29 10/27/2018   TSH 2.19 10/27/2018   HGBA1C 6.2 10/27/2018    Mm 3d Screen Breast Bilateral  Result Date: 07/21/2018 CLINICAL DATA:  Screening. EXAM: DIGITAL SCREENING BILATERAL MAMMOGRAM WITH TOMO AND CAD COMPARISON:  Previous exam(s). ACR Breast Density Category c: The breast tissue is heterogeneously dense, which may obscure small masses. FINDINGS: There are no findings suspicious for malignancy. Images were processed with CAD. IMPRESSION: No mammographic evidence of malignancy. A result letter of this screening mammogram will be mailed directly to the patient. RECOMMENDATION: Screening mammogram in one year. (Code:[id number]-B-01Y) BI-RADS CATEGORY  1: Negative. Electronically Signed   By: Annia Belt M.D.   On: 07/21/2018 09:57    Assessment & Plan:   Joy was seen today for hypertension.  Diagnoses and all orders for this visit:  Essential hypertension, benign- Her blood pressure is not adequately well controlled.  I have asked her to start taking indapamide. -     indapamide (LOZOL) 1.25 MG tablet; Take 1 tablet (1.25 mg total) by mouth daily.   Hyperlipidemia LDL goal <130- I have encouraged her to take the statin for cardiovascular risk reduction. -     rosuvastatin (CRESTOR) 10 MG tablet; Take 1 tablet (10 mg total) by mouth daily.  Bilateral hand pain- I am concerned she has carpal tunnel syndrome so I have asked her to undergo an NCS/EMG. -     Ambulatory referral to Neurology   I have discontinued Nasira B. Flatt's triamterene-hydrochlorothiazide. I am also having her start on indapamide. Additionally, I am having her  maintain her Cholecalciferol and rosuvastatin.  Meds ordered this encounter  Medications  . rosuvastatin (CRESTOR) 10 MG tablet    Sig: Take 1 tablet (10 mg total) by mouth daily.    Dispense:  90 tablet    Refill:  1  . indapamide (LOZOL) 1.25 MG tablet    Sig: Take 1 tablet (1.25 mg total) by mouth daily.    Dispense:  90 tablet    Refill:  0     Follow-up: Return in about 6 weeks (around 07/20/2019).  Scarlette Calico, MD

## 2019-06-10 ENCOUNTER — Other Ambulatory Visit: Payer: Self-pay

## 2019-06-10 DIAGNOSIS — R202 Paresthesia of skin: Secondary | ICD-10-CM

## 2019-06-11 ENCOUNTER — Other Ambulatory Visit: Payer: Self-pay | Admitting: Internal Medicine

## 2019-06-11 DIAGNOSIS — E785 Hyperlipidemia, unspecified: Secondary | ICD-10-CM

## 2019-06-11 DIAGNOSIS — I10 Essential (primary) hypertension: Secondary | ICD-10-CM

## 2019-06-11 MED ORDER — ROSUVASTATIN CALCIUM 10 MG PO TABS: 10.0000 mg | ORAL_TABLET | Freq: Every day | ORAL | 1 refills | Status: DC

## 2019-06-11 MED ORDER — INDAPAMIDE 1.25 MG PO TABS: 1.2500 mg | ORAL_TABLET | Freq: Every day | ORAL | 0 refills | Status: DC

## 2019-06-11 NOTE — Telephone Encounter (Signed)
Routing to CMA 

## 2019-06-11 NOTE — Telephone Encounter (Signed)
Requested medication (s) are due for refill today: yes  Requested medication (s) are on the active medication list: yes  Last refill:  06/08/2019  Future visit scheduled: no  Notes to clinic:  Patient states that she is suppose to have brand and not generic    Requested Prescriptions  Pending Prescriptions Disp Refills   rosuvastatin (CRESTOR) 10 MG tablet 90 tablet 1    Sig: Take 1 tablet (10 mg total) by mouth daily.     Cardiovascular:  Antilipid - Statins Failed - 06/11/2019 11:24 AM      Failed - LDL in normal range and within 360 days    LDL Cholesterol  Date Value Ref Range Status  10/27/2018 118 (H) 0 - 99 mg/dL Final         Passed - Total Cholesterol in normal range and within 360 days    Cholesterol  Date Value Ref Range Status  10/27/2018 189 0 - 200 mg/dL Final    Comment:    ATP III Classification       Desirable:  < 200 mg/dL               Borderline High:  200 - 239 mg/dL          High:  > = 831 mg/dL         Passed - HDL in normal range and within 360 days    HDL  Date Value Ref Range Status  10/27/2018 45.80 >39.00 mg/dL Final         Passed - Triglycerides in normal range and within 360 days    Triglycerides  Date Value Ref Range Status  10/27/2018 127.0 0.0 - 149.0 mg/dL Final    Comment:    Normal:  <150 mg/dLBorderline High:  150 - 199 mg/dL         Passed - Patient is not pregnant      Passed - Valid encounter within last 12 months    Recent Outpatient Visits          3 days ago Essential hypertension, benign   [facility] -Madelin Rear, MD   7 months ago Essential hypertension, benign   [facility] -Madelin Rear, MD   1 year ago Abdominal pain, epigastric   [facility] -Madelin Rear, MD   1 year ago Essential hypertension, benign   [facility] -Madelin Rear, MD   2 years ago Essential hypertension, benign   [facility] -Madelin Rear, MD             Signed Prescriptions Disp Refills   indapamide (LOZOL) 1.25 MG tablet 90 tablet 0    Sig: Take 1 tablet (1.25 mg total) by mouth daily.     Cardiovascular: Diuretics - Thiazide Failed - 06/11/2019 11:24 AM      Failed - Last BP in normal range    BP Readings from Last 1 Encounters:  06/08/19 (!) 164/84         Passed - Ca in normal range and within 360 days    Calcium  Date Value Ref Range Status  10/27/2018 9.8 8.4 - 10.5 mg/dL Final         Passed - Cr in normal range and within 360 days    Creatinine, Ser  Date Value Ref Range Status  10/27/2018 0.74 0.40 - 1.20 mg/dL Final  Passed - K in normal range and within 360 days    Potassium  Date Value Ref Range Status  10/27/2018 3.9 3.5 - 5.1 mEq/L Final         Passed - Na in normal range and within 360 days    Sodium  Date Value Ref Range Status  10/27/2018 139 135 - 145 mEq/L Final         Passed - Valid encounter within last 6 months    Recent Outpatient Visits          3 days ago Essential hypertension, benign   [place], Thomas L, MD   7 months ago Essential hypertension, benign   [place], Thomas L, MD   1 year ago Abdominal pain, epigastric   [facility] -Mayer Camel, MD   1 year ago Essential hypertension, benign   [place], Thomas L, MD   2 years ago Essential hypertension, benign   [facility] -Mayer Camel, MD

## 2019-06-11 NOTE — Telephone Encounter (Signed)
Medication Refill - Medication: indapamide (LOZOL) 1.25 MG tablet  Pt states that she is not supposed to receive the generic crestor, she also does not use [facility]. She needs this rerouted   Has the patient contacted their pharmacy? Yes.   (Agent: If no, request that the patient contact the pharmacy for the refill.) (Agent: If yes, when and what did the pharmacy advise?)  Preferred Pharmacy (with phone number or street name):  [facility] [phone] - [address], [place] [address]  [place] [address]  Phone: [phone] Fax: [phone]     Agent: Please be advised that RX refills may take up to 3 business days. We ask that you follow-up with your pharmacy.

## 2019-06-17 ENCOUNTER — Other Ambulatory Visit: Payer: Self-pay | Admitting: Internal Medicine

## 2019-06-17 DIAGNOSIS — Z1231 Encounter for screening mammogram for malignant neoplasm of breast: Secondary | ICD-10-CM

## 2019-06-23 ENCOUNTER — Other Ambulatory Visit: Payer: Self-pay

## 2019-06-23 ENCOUNTER — Encounter: Payer: Self-pay | Admitting: Internal Medicine

## 2019-06-23 ENCOUNTER — Ambulatory Visit (INDEPENDENT_AMBULATORY_CARE_PROVIDER_SITE_OTHER): Payer: Medicare Other | Admitting: Neurology

## 2019-06-23 DIAGNOSIS — G5603 Carpal tunnel syndrome, bilateral upper limbs: Secondary | ICD-10-CM

## 2019-06-23 DIAGNOSIS — R202 Paresthesia of skin: Secondary | ICD-10-CM

## 2019-06-23 NOTE — Procedures (Signed)
[facility]  [address], [address]  [place], [place] [address] Tel: [phone] Fax:  [phone] Test Date:  06/23/2019  Patient: Kiara Holt DOB: 1951/01/29 Physician: Narda Amber, DO  Sex: Female Height: 5\' 6"  Ref Phys: Scarlette Calico, MD  ID#: [id number] Temp: 35.0C Technician:    Patient Complaints: This is a 68 year old female referred for evaluation of bilateral hand pain and paresthesias.  NCV & EMG Findings: Extensive electrodiagnostic testing of the right upper extremity and additional studies of the left shows:  1. Bilateral mixed palmar sensory responses show prolonged latencies.  Median and ulnar sensory responses are within normal limits.   2. Bilateral median and ulnar motor responses are within normal limits. 3. There is no evidence of active or chronic motor axonal loss changes affecting any of the tested muscles.  Motor unit configuration and recruitment pattern is within normal limits.  Impression: Bilateral median neuropathy at or distal to the wrist (mild), consistent with a clinical diagnosis of carpal tunnel syndrome.     ___________________________ Narda Amber, DO    Nerve Conduction Studies Anti Sensory Summary Table   Site NR Peak (ms) Norm Peak (ms) P-T Amp (V) Norm P-T Amp  Left Median Anti Sensory (2nd Digit)  35C  Wrist    3.3 <3.8 20.4 >10  Right Median Anti Sensory (2nd Digit)  35C  Wrist    3.5 <3.8 26.0 >10  Left Ulnar Anti Sensory (5th Digit)  35C  Wrist    2.7 <3.2 29.7 >5  Right Ulnar Anti Sensory (5th Digit)  35C  Wrist    2.4 <3.2 28.3 >5   Motor Summary Table   Site NR Onset (ms) Norm Onset (ms) O-P Amp (mV) Norm O-P Amp Site1 Site2 Delta-0 (ms) Dist (cm) Vel (m/s) Norm Vel (m/s)  Left Median Motor (Abd Poll Brev)  35C  Wrist    3.0 <4.0 9.9 >5 Elbow Wrist 5.5 32.0 58 >50  Elbow    8.5  9.0         Right Median Motor (Abd Poll Brev)  35C  Wrist    3.3 <4.0 10.5 >5 Elbow Wrist 5.8 32.0 55 >50  Elbow     9.1  10.1         Left Ulnar Motor (Abd Dig Minimi)  35C  Wrist    1.8 <3.1 8.0 >7 B Elbow Wrist 4.8 28.0 58 >50  B Elbow    6.6  7.5  A Elbow B Elbow 2.0 10.0 50 >50  A Elbow    8.6  6.8         Right Ulnar Motor (Abd Dig Minimi)  35C  Wrist    2.2 <3.1 7.7 >7 B Elbow Wrist 4.3 26.0 60 >50  B Elbow    6.5  7.1  A Elbow B Elbow 1.9 10.0 53 >50  A Elbow    8.4  6.8          Comparison Summary Table   Site NR Peak (ms) Norm Peak (ms) P-T Amp (V) Site1 Site2 Delta-P (ms) Norm Delta (ms)  Left Median/Ulnar Palm Comparison (Wrist - 8cm)  35C  Median Palm    2.0 <2.2 45.3 Median Palm Ulnar Palm 0.6   Ulnar Palm    1.4 <2.2 18.9      Right Median/Ulnar Palm Comparison (Wrist - 8cm)  35C  Median Palm    2.3 <2.2 27.4 Median Palm Ulnar Palm 1.0   Ulnar Palm  1.3 <2.2 20.4       EMG   Side Muscle Ins Act Fibs Psw Fasc Number Recrt Dur Dur. Amp Amp. Poly Poly. Comment  Right 1stDorInt Nml Nml Nml Nml Nml Nml Nml Nml Nml Nml Nml Nml N/A  Right Abd Poll Brev Nml Nml Nml Nml Nml Nml Nml Nml Nml Nml Nml Nml N/A  Right PronatorTeres Nml Nml Nml Nml Nml Nml Nml Nml Nml Nml Nml Nml N/A  Right Biceps Nml Nml Nml Nml Nml Nml Nml Nml Nml Nml Nml Nml N/A  Right Triceps Nml Nml Nml Nml Nml Nml Nml Nml Nml Nml Nml Nml N/A  Right Deltoid Nml Nml Nml Nml Nml Nml Nml Nml Nml Nml Nml Nml N/A  Left 1stDorInt Nml Nml Nml Nml Nml Nml Nml Nml Nml Nml Nml Nml N/A  Left Abd Poll Brev Nml Nml Nml Nml Nml Nml Nml Nml Nml Nml Nml Nml N/A  Left PronatorTeres Nml Nml Nml Nml Nml Nml Nml Nml Nml Nml Nml Nml N/A  Left Biceps Nml Nml Nml Nml Nml Nml Nml Nml Nml Nml Nml Nml N/A  Left Triceps Nml Nml Nml Nml Nml Nml Nml Nml Nml Nml Nml Nml N/A  Left Deltoid Nml Nml Nml Nml Nml Nml Nml Nml Nml Nml Nml Nml N/A      Waveforms:

## 2019-06-24 ENCOUNTER — Other Ambulatory Visit: Payer: Self-pay | Admitting: Internal Medicine

## 2019-06-24 DIAGNOSIS — G5603 Carpal tunnel syndrome, bilateral upper limbs: Secondary | ICD-10-CM

## 2019-08-11 ENCOUNTER — Ambulatory Visit
Admission: RE | Admit: 2019-08-11 | Discharge: 2019-08-11 | Disposition: A | Discharge status: Home / Self Care | Payer: Medicare Other | Source: Ambulatory Visit | Attending: Internal Medicine | Admitting: Internal Medicine

## 2019-08-11 ENCOUNTER — Other Ambulatory Visit: Payer: Self-pay

## 2019-08-11 DIAGNOSIS — Z1231 Encounter for screening mammogram for malignant neoplasm of breast: Secondary | ICD-10-CM

## 2019-08-11 LAB — HM MAMMOGRAPHY

## 2019-09-03 ENCOUNTER — Other Ambulatory Visit: Payer: Self-pay | Admitting: Internal Medicine

## 2019-09-03 DIAGNOSIS — I10 Essential (primary) hypertension: Secondary | ICD-10-CM

## 2019-10-06 ENCOUNTER — Other Ambulatory Visit: Payer: Self-pay

## 2019-10-06 ENCOUNTER — Ambulatory Visit: Payer: Medicare Other | Attending: Internal Medicine | Admitting: Occupational Therapy

## 2019-10-06 ENCOUNTER — Encounter: Payer: Self-pay | Admitting: Occupational Therapy

## 2019-10-06 DIAGNOSIS — R600 Localized edema: Secondary | ICD-10-CM | POA: Insufficient documentation

## 2019-10-06 DIAGNOSIS — M25631 Stiffness of right wrist, not elsewhere classified: Secondary | ICD-10-CM

## 2019-10-06 DIAGNOSIS — M79641 Pain in right hand: Secondary | ICD-10-CM | POA: Diagnosis present

## 2019-10-06 DIAGNOSIS — M25632 Stiffness of left wrist, not elsewhere classified: Secondary | ICD-10-CM | POA: Insufficient documentation

## 2019-10-06 DIAGNOSIS — M79642 Pain in left hand: Secondary | ICD-10-CM | POA: Insufficient documentation

## 2019-10-06 DIAGNOSIS — M6281 Muscle weakness (generalized): Secondary | ICD-10-CM | POA: Insufficient documentation

## 2019-10-06 NOTE — Therapy (Signed)
[facility] [address] [place], [place], [address] Phone: [phone]   Fax:  [phone]  Occupational Therapy Evaluation  Patient Details  Name: Kiara Holt MRN: [id number] Date of Birth: 04-16-1951 Referring Provider (OT): Scarlette Calico   Encounter Date: 10/06/2019  OT End of Session - 10/06/19 1439    Visit Number  1    Number of Visits  9    Date for OT Re-Evaluation  11/20/19    Authorization Type  [employer] Medicare    Authorization Time Period  Need PN every 10th visit    Authorization - Visit Number  1    Authorization - Number of Visits  9    Progress Note Due on Visit  10    OT Start Time  1230    OT Stop Time  1315    OT Time Calculation (min)  45 min    Activity Tolerance  Patient tolerated treatment well    Behavior During Therapy  [facility] for tasks assessed/performed       Past Medical History:  Diagnosis Date  . Arthritis    shoulder    Past Surgical History:  Procedure Laterality Date  . ABDOMINAL HYSTERECTOMY  1972  . COLONOSCOPY      There were no vitals filed for this visit.  Subjective Assessment - 10/06/19 1236    Subjective   They just ache sometime.  I still cook and clean.    Pertinent History  OA Right shoulder    Currently in Pain?  No/denies    Multiple Pain Sites  No        [facility] OT Assessment - 10/06/19 0001      Assessment   Medical Diagnosis  Bilateral carpal tunnel syndrome    Referring Provider (OT)  Scarlette Calico    Onset Date/Surgical Date  06/24/19    Hand Dominance  Right    Prior Therapy  NA      Restrictions   Weight Bearing Restrictions  No      Balance Screen   Has the patient fallen in the past 6 months  No      Prior Function   Level of Independence  Independent with basic ADLs    Vocation  Retired    Leisure  Walk, puzzles, clean      ADL   Eating/Feeding  Independent    Grooming  Independent    Upper Body Bathing  Independent    Lower Body  Bathing  Independent    Upper Body Dressing  Independent    Lower Body Dressing  Independent    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    ADL comments  Has had to adjust to accomodate for hand functioning - notes pain, and decreased strength      IADL   Prior Level of Function Meal Prep  independent    Meal Prep  Plans, prepares and serves adequate meals independently   Has moved to lighter pots and pans     Written Expression   Dominant Hand  Right    Handwriting  100% legible      Activity Tolerance   Activity Tolerance  Endurance does not limit participation in activity      Posture/Postural Control   Posture/Postural Control  No significant limitations      Sensation  Light Touch  Appears Intact    Stereognosis  Not tested    Hot/Cold  Appears Intact    Proprioception  Not tested    Additional Comments  Reports burning sensation in hot water      Coordination   Gross Motor Movements are Fluid and Coordinated  Yes    Fine Motor Movements are Fluid and Coordinated  Yes    9 Hole Peg Test  Right;Left    Right 9 Hole Peg Test  20.97    Left 9 Hole Peg Test  22.65      ROM / Strength   AROM / PROM / Strength  AROM;Strength      AROM   Overall AROM   Within functional limits for tasks performed      Strength   Overall Strength  Within functional limits for tasks performed      Hand Function   Right Hand Gross Grasp  Impaired    Right Hand Grip (lbs)  24    Right Hand Lateral Pinch  14 lbs    Right Hand 3 Point Pinch  10 lbs    Left Hand Gross Grasp  Impaired    Left Hand Grip (lbs)  20    Left Hand Lateral Pinch  12 lbs    Left 3 point pinch  10 lbs                      OT Education - 10/06/19 1438    Education Details  Patient issued bilateral wrist supports for rest.  Reviewed mechanism of carpal tunnel syndrome, and goals of  therapy    Person(s) Educated  Patient    Methods  Explanation    Comprehension  Verbalized understanding          OT Long Term Goals - 10/06/19 1612      OT LONG TERM GOAL #1   Title  Patient will complete a home exercise program to help improve range of motion and build strength in BUE's    Time  4    Period  Weeks    Status  New    Target Date  11/05/19      OT LONG TERM GOAL #2   Title  Patient will demonstrate improved right wrist active extension to 60 degrees to asssit with functional grip, and return to weight training activity    Time  4    Period  Weeks    Status  New      OT LONG TERM GOAL #3   Title  Patient will demonstrate improved ability to carry 25 lb crate with two hands without increase in apin symptoms    Time  4    Period  Weeks    Status  New      OT LONG TERM GOAL #4   Title  Patient will demo understanding of benefit of rest and exercise,and edema management to limit CTS symptoms    Time  4    Period  Weeks    Status  New      OT LONG TERM GOAL #5   Title  Patient will demonstrate awareness of splint wear and care    Time  4    Period  Weeks    Status  New            Plan - 10/06/19 1441    Clinical Impression Statement  Patient is a very healthy and active 69 year old  woman who has recently been diagnosed with Carpal Tunnel Syndrome.  Patient is limited by near constant pain, decreased active motion in wrists, and weakness in BUE's.  Patient will benefit form skilled OT intervention to help reduce pain, and gain functional strength in BUE.    OT Occupational Profile and History  Problem Focused Assessment - Including review of records relating to presenting problem    Occupational performance deficits (Please refer to evaluation for details):  ADL's;IADL's;Rest and Sleep;Leisure    Body Structure / Function / Physical Skills  ADL;Coordination;UE functional use;Pain;IADL;Edema;Mobility;ROM;Strength;FMC;Dexterity;Sensation    Rehab  Potential  Excellent    Clinical Decision Making  Limited treatment options, no task modification necessary    Comorbidities Affecting Occupational Performance:  May have comorbidities impacting occupational performance   OA R shoulder   Modification or Assistance to Complete Evaluation   No modification of tasks or assist necessary to complete eval    OT Frequency  2x / week    OT Duration  4 weeks    OT Treatment/Interventions  Self-care/ADL training;Electrical Stimulation;Iontophoresis;Therapeutic exercise;Moist Heat;Paraffin;Splinting;Patient/family education;Therapeutic activities;Ultrasound;Cryotherapy;Contrast Bath;DME and/or AE instruction;Manual Therapy    Plan  Ultrasound volar wrist/palm, start HEP for CTS    Consulted and Agree with Plan of Care  Patient       Patient will benefit from skilled therapeutic intervention in order to improve the following deficits and impairments:   Body Structure / Function / Physical Skills: ADL, Coordination, UE functional use, Pain, IADL, Edema, Mobility, ROM, Strength, FMC, Dexterity, Sensation       Visit Diagnosis: Pain in right hand - Plan: Ot plan of care cert/re-cert  Pain in left hand - Plan: Ot plan of care cert/re-cert  Muscle weakness (generalized) - Plan: Ot plan of care cert/re-cert  Localized edema - Plan: Ot plan of care cert/re-cert  Stiffness of left wrist, not elsewhere classified - Plan: Ot plan of care cert/re-cert  Stiffness of right wrist, not elsewhere classified - Plan: Ot plan of care cert/re-cert    Problem List Patient Active Problem List   Diagnosis Date Noted  . Carpal tunnel syndrome on both sides 06/08/2019  . Hyperlipidemia LDL goal <130 10/27/2018  . Gallstones 03/19/2018  . Esophageal dysphagia 03/18/2018  . GERD with esophagitis 03/18/2018  . Vitamin D deficiency 10/21/2017  . Essential hypertension, benign 02/17/2016  . PAC (premature atrial contraction) 02/10/2015  . Prediabetes 10/31/2011   . Routine general medical examination at a health care facility 10/31/2011  . OSTEOARTHRITIS, SHOULDER, RIGHT 11/29/2009    Collier Salina, OTR/L 10/06/2019, 4:23 PM  [facility] [address] [place], [place], [address] Phone: [phone]   Fax:  [phone]  Name: Kiara Holt MRN: [id number] Date of Birth: [birth year]

## 2019-10-13 ENCOUNTER — Encounter: Payer: Self-pay | Admitting: Occupational Therapy

## 2019-10-13 ENCOUNTER — Other Ambulatory Visit: Payer: Self-pay

## 2019-10-13 ENCOUNTER — Ambulatory Visit: Payer: Medicare Other | Admitting: Occupational Therapy

## 2019-10-13 DIAGNOSIS — M79641 Pain in right hand: Secondary | ICD-10-CM | POA: Diagnosis not present

## 2019-10-13 DIAGNOSIS — M25632 Stiffness of left wrist, not elsewhere classified: Secondary | ICD-10-CM

## 2019-10-13 DIAGNOSIS — M6281 Muscle weakness (generalized): Secondary | ICD-10-CM

## 2019-10-13 DIAGNOSIS — M79642 Pain in left hand: Secondary | ICD-10-CM

## 2019-10-13 DIAGNOSIS — M25631 Stiffness of right wrist, not elsewhere classified: Secondary | ICD-10-CM

## 2019-10-13 DIAGNOSIS — R600 Localized edema: Secondary | ICD-10-CM

## 2019-10-13 NOTE — Therapy (Signed)
[facility] [address] [place], [place], [address] Phone: [phone]   Fax:  [phone]  Occupational Therapy Treatment  Patient Details  Name: Kiara Holt MRN: [id number] Date of Birth: 1951/02/11 Referring Provider (OT): Sanda Linger   Encounter Date: 10/13/2019  OT End of Session - 10/13/19 1303    Visit Number  2    Number of Visits  9    Date for OT Re-Evaluation  11/20/19    Authorization Type  UHC Medicare    Authorization Time Period  Need PN every 10th visit    Authorization - Visit Number  2    Authorization - Number of Visits  9    OT Start Time  1215    OT Stop Time  1300    OT Time Calculation (min)  45 min    Activity Tolerance  Patient tolerated treatment well    Behavior During Therapy  [facility] for tasks assessed/performed       Past Medical History:  Diagnosis Date  . Arthritis    shoulder    Past Surgical History:  Procedure Laterality Date  . ABDOMINAL HYSTERECTOMY  1972  . COLONOSCOPY      There were no vitals filed for this visit.  Subjective Assessment - 10/13/19 1218    Subjective   Patient indicates she is wearing the splints at night, and to rest in.  She feels imporved symptoms in wrist and palm with braces on.    Currently in Pain?  Yes    Pain Score  6     Pain Location  Finger (Comment which one)    Pain Orientation  Right;Left    Pain Descriptors / Indicators  Pins and needles    Pain Type  Chronic pain    Pain Onset  1 to 4 weeks ago    Pain Frequency  Constant    Aggravating Factors   Lifting heavy items    Pain Relieving Factors  massaging                   OT Treatments/Exercises (OP) - 10/13/19 0001      ADLs   ADL Comments  Reviewed goals.  Patient in agreement.  Discussed benefots of balancing light work and rest.        Dance movement psychotherapist   Other Hand Exercises  Median nerve glides - see HEP.        Ultrasound   Ultrasound Location  Volar  wrist and proximal palm    Ultrasound Parameters  , continuous, 4 min each wrist 0.8 w/cm2    Ultrasound Goals  Edema;Pain      Manual Therapy   Manual Therapy  Soft tissue mobilization    Soft tissue mobilization  Volar wrist in conjunction with wrist stretch             OT Education - 10/13/19 1303    Education Details  Median nerve glides, heat for reduction of symptoms, balance of rest and light activity    Person(s) Educated  Patient    Methods  Explanation;Demonstration;Handout;Verbal cues;Tactile cues    Comprehension  Verbalized understanding;Returned demonstration;Need further instruction          OT Long Term Goals - 10/13/19 1306      OT LONG TERM GOAL #1   Title  Patient will complete a home exercise program to help improve range of motion and build strength in BUE's    Status  On-going  OT LONG TERM GOAL #2   Title  Patient will demonstrate improved right wrist active extension to 60 degrees to asssit with functional grip, and return to weight training activity    Status  On-going      OT LONG TERM GOAL #3   Title  Patient will demonstrate improved ability to carry 25 lb crate with two hands without increase in pain symptoms    Status  On-going      OT LONG TERM GOAL #4   Title  Patient will demo understanding of benefit of rest and exercise,and edema management to limit CTS symptoms    Status  On-going      OT LONG TERM GOAL #5   Title  Patient will demonstrate awareness of splint wear and care    Status  On-going            Plan - 10/13/19 1304    Clinical Impression Statement  Patient agreeable to goals for OT.  Patient with near constant discomfort (pins and needles) reorts some relief with wrist splints, responding well to interventions    OT Frequency  2x / week    OT Duration  4 weeks    OT Treatment/Interventions  Self-care/ADL training;Electrical Stimulation;Iontophoresis;Therapeutic exercise;Moist  Heat;Paraffin;Splinting;Patient/family education;Therapeutic activities;Ultrasound;Cryotherapy;Contrast Bath;DME and/or AE instruction;Manual Therapy    Plan  Review HEP for CTS, Assess symptoms- May need [place], or manula stm for volar wrist, begin coordiantin exercises - non resistive    OT Home Exercise Plan  median nerve glides    Consulted and Agree with Plan of Care  Patient       Patient will benefit from skilled therapeutic intervention in order to improve the following deficits and impairments:           Visit Diagnosis: Pain in right hand  Pain in left hand  Muscle weakness (generalized)  Localized edema  Stiffness of left wrist, not elsewhere classified  Stiffness of right wrist, not elsewhere classified    Problem List Patient Active Problem List   Diagnosis Date Noted  . Carpal tunnel syndrome on both sides 06/08/2019  . Hyperlipidemia LDL goal <130 10/27/2018  . Gallstones 03/19/2018  . Esophageal dysphagia 03/18/2018  . GERD with esophagitis 03/18/2018  . Vitamin D deficiency 10/21/2017  . Essential hypertension, benign 02/17/2016  . PAC (premature atrial contraction) 02/10/2015  . Prediabetes 10/31/2011  . Routine general medical examination at a health care facility 10/31/2011  . OSTEOARTHRITIS, SHOULDER, RIGHT 11/29/2009    [name], OTR/L 10/13/2019, 1:08 PM  [facility] [address] [place], [place], [address] Phone: [phone]   Fax:  [phone]  Name: Kiara Holt MRN: [id number] Date of Birth: February 13, 1951

## 2019-10-13 NOTE — Patient Instructions (Signed)
Access Code: [email or web address] URL: [email or web address] Date: 10/13/2019 Prepared by: Merleen Milliner  Exercises Seated Median Nerve Glide - 10 reps - 3 sets - 1x daily - 7x weekly Standing Median Nerve Glide - 10 reps - 3 sets - 1x daily - 7x weekly

## 2019-10-15 ENCOUNTER — Encounter: Payer: Self-pay | Admitting: Occupational Therapy

## 2019-10-15 ENCOUNTER — Ambulatory Visit: Payer: Medicare Other | Admitting: Occupational Therapy

## 2019-10-15 ENCOUNTER — Other Ambulatory Visit: Payer: Self-pay

## 2019-10-15 DIAGNOSIS — R600 Localized edema: Secondary | ICD-10-CM

## 2019-10-15 DIAGNOSIS — M79641 Pain in right hand: Secondary | ICD-10-CM

## 2019-10-15 DIAGNOSIS — M6281 Muscle weakness (generalized): Secondary | ICD-10-CM

## 2019-10-15 DIAGNOSIS — M25632 Stiffness of left wrist, not elsewhere classified: Secondary | ICD-10-CM

## 2019-10-15 DIAGNOSIS — M25631 Stiffness of right wrist, not elsewhere classified: Secondary | ICD-10-CM

## 2019-10-15 DIAGNOSIS — M79642 Pain in left hand: Secondary | ICD-10-CM

## 2019-10-15 NOTE — Therapy (Signed)
[facility] [address] [place], [place], [address] Phone: [phone]   Fax:  [phone]  Occupational Therapy Treatment  Patient Details  Name: Kiara Holt MRN: [id number] Date of Birth: 1951/08/03 Referring Provider (OT): Scarlette Calico   Encounter Date: 10/15/2019  OT End of Session - 10/15/19 1327    Visit Number  3    Number of Visits  9    Date for OT Re-Evaluation  11/20/19    Authorization Type  UHC Medicare    Authorization Time Period  Need PN every 10th visit    Authorization - Visit Number  3    Authorization - Number of Visits  9    Progress Note Due on Visit  10    OT Start Time  1228    OT Stop Time  1315    OT Time Calculation (min)  47 min    Activity Tolerance  Patient tolerated treatment well    Behavior During Therapy  [facility] for tasks assessed/performed       Past Medical History:  Diagnosis Date  . Arthritis    shoulder    Past Surgical History:  Procedure Laterality Date  . ABDOMINAL HYSTERECTOMY  1972  . COLONOSCOPY      There were no vitals filed for this visit.  Subjective Assessment - 10/15/19 1247    Subjective   Patient had difficulty accessing her HEP on tablet.  Worked through process to pull up HEP on [place], also sent to her email    Pertinent History  OA Right shoulder    Currently in Pain?  Yes    Pain Score  5     Pain Location  Hand    Pain Orientation  Right;Left    Pain Descriptors / Indicators  Pins and needles    Pain Type  Chronic pain    Pain Onset  1 to 4 weeks ago    Pain Frequency  Intermittent    Aggravating Factors   cramping at night    Pain Relieving Factors  massaging                   OT Treatments/Exercises (OP) - 10/15/19 0001      Neurological Re-education Exercises   Other Exercises 1  Reviewed with patient again why fingers are symptomatic when problem is based in wrist.        Manual Therapy   Manual Therapy  Soft  tissue mobilization;Joint mobilization    Joint Mobilization  right carpal/ forearm, right carpal metacarpals    Soft tissue mobilization  Volar wrist in conjunction with wrist stretch             OT Education - 10/15/19 1326    Education Details  Reviewed nerve glides - and how to access, added coordination exercises for wrist/hand coord    Person(s) Educated  Patient    Methods  Explanation;Demonstration;Handout;Verbal cues    Comprehension  Verbalized understanding;Returned demonstration          OT Long Term Goals - 10/15/19 1330      OT LONG TERM GOAL #1   Title  Patient will complete a home exercise program to help improve range of motion and build strength in BUE's    Period  Weeks    Status  On-going      OT LONG TERM GOAL #2   Title  Patient will demonstrate improved right wrist active extension to 60 degrees to asssit  with functional grip, and return to weight training activity    Time  4    Period  Weeks    Status  On-going      OT LONG TERM GOAL #3   Title  Patient will demonstrate improved ability to carry 25 lb crate with two hands without increase in pain symptoms    Status  On-going      OT LONG TERM GOAL #4   Title  Patient will demo understanding of benefit of rest and exercise,and edema management to limit CTS symptoms    Status  Achieved      OT LONG TERM GOAL #5   Title  Patient will demonstrate awareness of splint wear and care    Status  On-going            Plan - 10/15/19 1328    Clinical Impression Statement  Patient is responding well to manual techniques.  Patien tis dilligent about balancing rest and work.  Patien tfinding some relief with bilateral splints.    OT Frequency  2x / week    OT Duration  4 weeks    OT Treatment/Interventions  Self-care/ADL training;Electrical Stimulation;Iontophoresis;Therapeutic exercise;Moist Heat;Paraffin;Splinting;Patient/family education;Therapeutic activities;Ultrasound;Cryotherapy;Contrast  Bath;DME and/or AE instruction;Manual Therapy    Plan  volar edema massage - soft tissue mob, if pain reduced - may try light strengthening    OT Home Exercise Plan  median nerve glides, FM coord    Consulted and Agree with Plan of Care  Patient       Patient will benefit from skilled therapeutic intervention in order to improve the following deficits and impairments:           Visit Diagnosis: Pain in right hand  Pain in left hand  Muscle weakness (generalized)  Localized edema  Stiffness of left wrist, not elsewhere classified  Stiffness of right wrist, not elsewhere classified    Problem List Patient Active Problem List   Diagnosis Date Noted  . Carpal tunnel syndrome on both sides 06/08/2019  . Hyperlipidemia LDL goal <130 10/27/2018  . Gallstones 03/19/2018  . Esophageal dysphagia 03/18/2018  . GERD with esophagitis 03/18/2018  . Vitamin D deficiency 10/21/2017  . Essential hypertension, benign 02/17/2016  . PAC (premature atrial contraction) 02/10/2015  . Prediabetes 10/31/2011  . Routine general medical examination at a health care facility 10/31/2011  . OSTEOARTHRITIS, SHOULDER, RIGHT 11/29/2009    Collier Salina, OTR/L 10/15/2019, 1:35 PM  [facility] [address] [place], [place], [address] Phone: [phone]   Fax:  [phone]  Name: Kiara Holt MRN: [id number] Date of Birth: October 14, 1950

## 2019-10-15 NOTE — Patient Instructions (Signed)
  Coordination Activities  Perform the following activities for 10 minutes 2 times per day with both hand(s).   Rotate ball in fingertips (clockwise and counter-clockwise).  Pick up coins and place in container or coin bank.  Pick up coins and stack.

## 2019-10-19 ENCOUNTER — Ambulatory Visit: Payer: Medicare Other | Admitting: Occupational Therapy

## 2019-10-19 ENCOUNTER — Other Ambulatory Visit: Payer: Self-pay

## 2019-10-19 ENCOUNTER — Encounter: Payer: Self-pay | Admitting: Occupational Therapy

## 2019-10-19 DIAGNOSIS — M79641 Pain in right hand: Secondary | ICD-10-CM

## 2019-10-19 DIAGNOSIS — M25632 Stiffness of left wrist, not elsewhere classified: Secondary | ICD-10-CM

## 2019-10-19 DIAGNOSIS — M25631 Stiffness of right wrist, not elsewhere classified: Secondary | ICD-10-CM

## 2019-10-19 DIAGNOSIS — R600 Localized edema: Secondary | ICD-10-CM

## 2019-10-19 DIAGNOSIS — M79642 Pain in left hand: Secondary | ICD-10-CM

## 2019-10-19 DIAGNOSIS — M6281 Muscle weakness (generalized): Secondary | ICD-10-CM

## 2019-10-19 NOTE — Therapy (Signed)
[facility] [address] [place], [place], [address] Phone: [phone]   Fax:  [phone]  Occupational Therapy Treatment  Patient Details  Name: ALEJANDRINA RAIMER MRN: [id number] Date of Birth: 10/03/1950 Referring Provider (OT): Scarlette Calico   Encounter Date: 10/19/2019  OT End of Session - 10/19/19 1415    Visit Number  4    Number of Visits  9    Date for OT Re-Evaluation  11/20/19    Authorization Type  UHC Medicare    Authorization Time Period  Need PN every 10th visit    Authorization - Visit Number  4    Progress Note Due on Visit  10    OT Start Time  1315    OT Stop Time  1400    OT Time Calculation (min)  45 min    Activity Tolerance  Patient tolerated treatment well    Behavior During Therapy  [facility] for tasks assessed/performed       Past Medical History:  Diagnosis Date  . Arthritis    shoulder    Past Surgical History:  Procedure Laterality Date  . ABDOMINAL HYSTERECTOMY  1972  . COLONOSCOPY      There were no vitals filed for this visit.  Subjective Assessment - 10/19/19 1316    Subjective   Patient indicates she was able to pull up exercises at home.    Currently in Pain?  Yes    Pain Score  5     Pain Location  Hand    Pain Orientation  Right;Left    Pain Descriptors / Indicators  Pins and needles    Pain Type  Chronic pain    Pain Onset  1 to 4 weeks ago    Pain Frequency  Constant    Aggravating Factors   cramping at night    Pain Relieving Factors  massaging                   OT Treatments/Exercises (OP) - 10/19/19 0001      Hand Exercises   Other Hand Exercises  Wrist exercise passive to active,  Improved active range noted in right wrist.  Stretching into wrist, extension during ultrasound.  Patient with report of 5/10 pain at start of session.  Following treatment - stretch, Ultrasound, and manual therapy - patient with pain 1-2/10 in right hand, and no pain in  left hand    Other Hand Exercises  Initiated genlte strengthening exercise for both hands without chnage in pain report.  Gripper at lowest setting x 12 blocks each hand.  Unable to tolerate at second setting.  Need to increase endurance before increasing resistance.      Ultrasound   Ultrasound Location  volar wrist and proximal palm   Bilateral   Ultrasound Parameters  3 mHz, continuous, 0.8 w/cm2, x 8 min each wrist    Ultrasound Goals  Pain      Manual Therapy   Manual Therapy  Joint mobilization;Soft tissue mobilization    Joint Mobilization  Gentle carpal mobilization with passive movement    Soft tissue mobilization  volar wrist             OT Education - 10/19/19 1415    Education Details  Goal achievement regarding right wrist extension    Person(s) Educated  Patient    Methods  Explanation    Comprehension  Verbalized understanding          OT Long  Term Goals - 10/19/19 1339      OT LONG TERM GOAL #1   Title  Patient will complete a home exercise program to help improve range of motion and build strength in BUE's    Status  Achieved      OT LONG TERM GOAL #2   Title  Patient will demonstrate improved right wrist active extension to 60 degrees to asssit with functional grip, and return to weight training activity    Status  Achieved      OT LONG TERM GOAL #4   Title  Patient will demo understanding of benefit of rest and exercise,and edema management to limit CTS symptoms    Status  On-going      OT LONG TERM GOAL #5   Title  Patient will demonstrate awareness of splint wear and care    Status  Achieved            Plan - 10/19/19 1419    Clinical Impression Statement  Patient shows definite reduction in symptoms with therapy interventions.  Patient still with significant pain and cramping at night.  Need further emphasis on home management of pain symptoms.    OT Frequency  2x / week    OT Duration  4 weeks    OT Treatment/Interventions   Self-care/ADL training;Electrical Stimulation;Iontophoresis;Therapeutic exercise;Moist Heat;Paraffin;Splinting;Patient/family education;Therapeutic activities;Ultrasound;Cryotherapy;Contrast Bath;DME and/or AE instruction;Manual Therapy    Plan  Pain management for home, volar wrist management, stretch of wrist with elbow extended - light wall stretch. gentle strengthening    OT Home Exercise Plan  median nerve glides, FM coord    Consulted and Agree with Plan of Care  Patient       Patient will benefit from skilled therapeutic intervention in order to improve the following deficits and impairments:           Visit Diagnosis: Pain in right hand  Pain in left hand  Muscle weakness (generalized)  Localized edema  Stiffness of left wrist, not elsewhere classified  Stiffness of right wrist, not elsewhere classified    Problem List Patient Active Problem List   Diagnosis Date Noted  . Carpal tunnel syndrome on both sides 06/08/2019  . Hyperlipidemia LDL goal <130 10/27/2018  . Gallstones 03/19/2018  . Esophageal dysphagia 03/18/2018  . GERD with esophagitis 03/18/2018  . Vitamin D deficiency 10/21/2017  . Essential hypertension, benign 02/17/2016  . PAC (premature atrial contraction) 02/10/2015  . Prediabetes 10/31/2011  . Routine general medical examination at a health care facility 10/31/2011  . OSTEOARTHRITIS, SHOULDER, RIGHT 11/29/2009    Collier Salina, OTR/L 10/19/2019, 2:21 PM  [facility] [address] [place], [place], [address] Phone: [phone]   Fax:  [phone]  Name: JONNAE FONSECA MRN: [id number] Date of Birth: July 29, 1951

## 2019-10-21 ENCOUNTER — Ambulatory Visit: Payer: Medicare Other | Admitting: Occupational Therapy

## 2019-10-21 ENCOUNTER — Encounter: Payer: Self-pay | Admitting: Occupational Therapy

## 2019-10-21 ENCOUNTER — Other Ambulatory Visit: Payer: Self-pay

## 2019-10-21 DIAGNOSIS — M25632 Stiffness of left wrist, not elsewhere classified: Secondary | ICD-10-CM

## 2019-10-21 DIAGNOSIS — M6281 Muscle weakness (generalized): Secondary | ICD-10-CM

## 2019-10-21 DIAGNOSIS — M79641 Pain in right hand: Secondary | ICD-10-CM | POA: Diagnosis not present

## 2019-10-21 DIAGNOSIS — R600 Localized edema: Secondary | ICD-10-CM

## 2019-10-21 DIAGNOSIS — M79642 Pain in left hand: Secondary | ICD-10-CM

## 2019-10-21 DIAGNOSIS — M25631 Stiffness of right wrist, not elsewhere classified: Secondary | ICD-10-CM

## 2019-10-21 NOTE — Therapy (Signed)
[facility] [address] [place], [place], [address] Phone: [phone]   Fax:  [phone]  Occupational Therapy Treatment  Patient Details  Name: Kiara Holt MRN: [id number] Date of Birth: 1951-07-18 Referring Provider (OT): Scarlette Calico   Encounter Date: 10/21/2019  OT End of Session - 10/21/19 1312    Visit Number  5    Number of Visits  9    Date for OT Re-Evaluation  11/20/19    Authorization Type  UHC Medicare    Authorization Time Period  Need PN every 10th visit    Authorization - Visit Number  5    Authorization - Number of Visits  9    Progress Note Due on Visit  10    OT Start Time  1225    OT Stop Time  1307    OT Time Calculation (min)  42 min    Activity Tolerance  Patient tolerated treatment well    Behavior During Therapy  [facility] for tasks assessed/performed       Past Medical History:  Diagnosis Date  . Arthritis    shoulder    Past Surgical History:  Procedure Laterality Date  . ABDOMINAL HYSTERECTOMY  1972  . COLONOSCOPY      There were no vitals filed for this visit.  Subjective Assessment - 10/21/19 1229    Currently in Pain?  Yes    Pain Score  2     Pain Location  Hand    Pain Orientation  Right;Left    Pain Descriptors / Indicators  Pins and needles    Pain Type  Chronic pain    Pain Radiating Towards  digits- 2/3/4    Pain Onset  1 to 4 weeks ago    Pain Frequency  Constant    Aggravating Factors   cramping at night - lessening    Pain Relieving Factors  rest, massage         [facility] OT Assessment - 10/21/19 0001      Hand Function   Right Hand Gross Grasp  Impaired    Right Hand Grip (lbs)  30    Right Hand Lateral Pinch  14 lbs    Right Hand 3 Point Pinch  12 lbs    Left Hand Gross Grasp  Impaired    Left Hand Grip (lbs)  30    Left Hand Lateral Pinch  12 lbs    Left 3 point pinch  12 lbs               OT Treatments/Exercises (OP) - 10/21/19 0001       ADLs   ADL Comments  Retested pinch and grip strength.  Patient with 6lb increase in grip right and 10 lb increase in grip left.  Overall patient reports decreased pain in hands during day , and at night - down to 5/10 (versus 8/10) at night.  Educcated patient on the benfefit of avoiding repetitive and resistive wrist/hand motion.  Patient has latered her cooking style to reduce time spennt chopping with automatic chopper.  Patient seems to do an excellent job balancing rest and work.        Hand Exercises   Other Hand Exercises  Digiflex (yellow) 5 reps each hand Standing weighted wrist stretch on tabletop, and modified wall push up with modifications to not overstress wrist joints.      Other Hand Exercises  UBE x 4 min on level 2 - 2 min  forward, 2 moin backward without increase in symptoms.  Patient rates pain as 0/10 at end of session.               OT Education - 10/21/19 1311    Education Details  increase in grip strength and norm for her age, reinforced concept of rest and activity to mange symptoms    Person(s) Educated  Patient    Methods  Explanation    Comprehension  Verbalized understanding          OT Long Term Goals - 10/19/19 1339      OT LONG TERM GOAL #1   Title  Patient will complete a home exercise program to help improve range of motion and build strength in BUE's    Status  Achieved      OT LONG TERM GOAL #2   Title  Patient will demonstrate improved right wrist active extension to 60 degrees to asssit with functional grip, and return to weight training activity    Status  Achieved      OT LONG TERM GOAL #4   Title  Patient will demo understanding of benefit of rest and exercise,and edema management to limit CTS symptoms    Status  On-going      OT LONG TERM GOAL #5   Title  Patient will demonstrate awareness of splint wear and care    Status  Achieved            Plan - 10/21/19 1312    Clinical Impression Statement  Patient is  experiencing a decrease in symptoms, now lasting into time outsid eof therapy session.    OT Frequency  2x / week    OT Duration  4 weeks    OT Treatment/Interventions  Self-care/ADL training;Electrical Stimulation;Iontophoresis;Therapeutic exercise;Moist Heat;Paraffin;Splinting;Patient/family education;Therapeutic activities;Ultrasound;Cryotherapy;Contrast Bath;DME and/or AE instruction;Manual Therapy    Plan  volar wrist management, forearm, wrist mob gentle, LIGHT strengthening    OT Home Exercise Plan  median nerve glides, FM coord    Consulted and Agree with Plan of Care  Patient       Patient will benefit from skilled therapeutic intervention in order to improve the following deficits and impairments:           Visit Diagnosis: Pain in right hand  Pain in left hand  Muscle weakness (generalized)  Localized edema  Stiffness of left wrist, not elsewhere classified  Stiffness of right wrist, not elsewhere classified    Problem List Patient Active Problem List   Diagnosis Date Noted  . Carpal tunnel syndrome on both sides 06/08/2019  . Hyperlipidemia LDL goal <130 10/27/2018  . Gallstones 03/19/2018  . Esophageal dysphagia 03/18/2018  . GERD with esophagitis 03/18/2018  . Vitamin D deficiency 10/21/2017  . Essential hypertension, benign 02/17/2016  . PAC (premature atrial contraction) 02/10/2015  . Prediabetes 10/31/2011  . Routine general medical examination at a health care facility 10/31/2011  . OSTEOARTHRITIS, SHOULDER, RIGHT 11/29/2009    Collier Salina, OTR/L 10/21/2019, 1:14 PM  [facility] [address] [place], [place], [address] Phone: [phone]   Fax:  [phone]  Name: DELLAR TRABER MRN: [id number] Date of Birth: 05-13-1951

## 2019-10-26 ENCOUNTER — Other Ambulatory Visit: Payer: Self-pay

## 2019-10-26 ENCOUNTER — Ambulatory Visit: Payer: Medicare Other | Admitting: Occupational Therapy

## 2019-10-26 ENCOUNTER — Encounter: Payer: Self-pay | Admitting: Occupational Therapy

## 2019-10-26 DIAGNOSIS — M79641 Pain in right hand: Secondary | ICD-10-CM | POA: Diagnosis not present

## 2019-10-26 DIAGNOSIS — M25631 Stiffness of right wrist, not elsewhere classified: Secondary | ICD-10-CM

## 2019-10-26 DIAGNOSIS — R600 Localized edema: Secondary | ICD-10-CM

## 2019-10-26 DIAGNOSIS — M6281 Muscle weakness (generalized): Secondary | ICD-10-CM

## 2019-10-26 DIAGNOSIS — M25632 Stiffness of left wrist, not elsewhere classified: Secondary | ICD-10-CM

## 2019-10-26 DIAGNOSIS — M79642 Pain in left hand: Secondary | ICD-10-CM

## 2019-10-26 NOTE — Therapy (Signed)
[facility] [address] [place], [place], [address] Phone: [phone]   Fax:  [phone]  Occupational Therapy Treatment  Patient Details  Name: Kiara Holt MRN: [id number] Date of Birth: 11/28/1950 Referring Provider (OT): Scarlette Calico   Encounter Date: 10/26/2019  OT End of Session - 10/26/19 1357    Visit Number  6    Number of Visits  9    Date for OT Re-Evaluation  11/20/19    Authorization Type  [employer] Medicare    Authorization Time Period  Need PN every 10th visit    Authorization - Visit Number  6    Authorization - Number of Visits  9    Progress Note Due on Visit  10    OT Start Time  1310    OT Stop Time  1400    OT Time Calculation (min)  50 min    Activity Tolerance  Patient tolerated treatment well    Behavior During Therapy  [facility] for tasks assessed/performed       Past Medical History:  Diagnosis Date  . Arthritis    shoulder    Past Surgical History:  Procedure Laterality Date  . ABDOMINAL HYSTERECTOMY  1972  . COLONOSCOPY      There were no vitals filed for this visit.  Subjective Assessment - 10/26/19 1314    Subjective   Patient indicates that she has had some pins and needles sensation at volar wrist over the weekend.    Currently in Pain?  Yes    Pain Score  3     Pain Location  Hand    Pain Orientation  Right;Left    Pain Descriptors / Indicators  Pins and needles    Pain Type  Chronic pain    Pain Radiating Towards  Palm of hand    Pain Onset  In the past 7 days    Pain Frequency  Constant    Aggravating Factors   cramping at night    Pain Relieving Factors  rest, massage    Multiple Pain Sites  No         [facility] OT Assessment - 10/26/19 0001      Sensation   Stereognosis  Appears Intact               OT Treatments/Exercises (OP) - 10/26/19 0001      ADLs   Cooking  Patient still avoids lifting heavy objects as a protective measure.  Slowly working to  increase strengthening throughout [place] with ultimate goal of returning to prior level of UE functioning.        Hand Exercises   Other Hand Exercises  Right wrist strengthening - 1 lb weight for resisited wrist flex/ext, radila deviation    Other Hand Exercises  UBE x 6 min 3 min forward 3 min backward without difficulty.        Manual Therapy   Soft tissue mobilization  Patient with reported symptoms in wrist (moreso than digits and distal forewarm in LUE.  Soft tissue mobilization to address aponeurosis between radius and ulna in [place] forearms.  Both with minor restrictions, although symptoms of pins and needles only present in LUE.               OT Education - 10/26/19 1357    Education Details  Forearm motion and soft tissue restrictions    Person(s) Educated  Patient    Methods  Explanation    Comprehension  Verbalized understanding          OT Long Term Goals - 10/26/19 1359      OT LONG TERM GOAL #1   Title  Patient will complete a home exercise program to help improve range of motion and build strength in BUE's    Status  Achieved      OT LONG TERM GOAL #2   Title  Patient will demonstrate improved right wrist active extension to 60 degrees to asssit with functional grip, and return to weight training activity    Status  Achieved      OT LONG TERM GOAL #3   Title  Patient will demonstrate improved ability to carry 25 lb crate with two hands without increase in pain symptoms    Status  On-going      OT LONG TERM GOAL #4   Title  Patient will demo understanding of benefit of rest and exercise,and edema management to limit CTS symptoms    Status  On-going      OT LONG TERM GOAL #5   Title  Patient will demonstrate awareness of splint wear and care    Status  Achieved            Plan - 10/26/19 1358    Clinical Impression Statement  Patient is experiencing a decrease in symptoms in digits of both hands, newer symptoms evident in left wrist/forearm     OT Frequency  2x / week    OT Duration  4 weeks    OT Treatment/Interventions  Self-care/ADL training;Electrical Stimulation;Iontophoresis;Therapeutic exercise;Moist Heat;Paraffin;Splinting;Patient/family education;Therapeutic activities;Ultrasound;Cryotherapy;Contrast Bath;DME and/or AE instruction;Manual Therapy    Plan  Consider [place] left volar wrist and forearm in conjunction with forearm stretch    OT Home Exercise Plan  median nerve glides, FM coord    Consulted and Agree with Plan of Care  Patient       Patient will benefit from skilled therapeutic intervention in order to improve the following deficits and impairments:           Visit Diagnosis: Pain in right hand  Pain in left hand  Muscle weakness (generalized)  Localized edema  Stiffness of left wrist, not elsewhere classified  Stiffness of right wrist, not elsewhere classified    Problem List Patient Active Problem List   Diagnosis Date Noted  . Carpal tunnel syndrome on both sides 06/08/2019  . Hyperlipidemia LDL goal <130 10/27/2018  . Gallstones 03/19/2018  . Esophageal dysphagia 03/18/2018  . GERD with esophagitis 03/18/2018  . Vitamin D deficiency 10/21/2017  . Essential hypertension, benign 02/17/2016  . PAC (premature atrial contraction) 02/10/2015  . Prediabetes 10/31/2011  . Routine general medical examination at a health care facility 10/31/2011  . OSTEOARTHRITIS, SHOULDER, RIGHT 11/29/2009    Collier Salina, OTR/L 10/26/2019, 2:01 PM  [facility] [address] [place], [place], [address] Phone: [phone]   Fax:  [phone]  Name: KAFI DOTTER MRN: [id number] Date of Birth: 03-May-1951

## 2019-10-28 ENCOUNTER — Encounter: Payer: Self-pay | Admitting: Occupational Therapy

## 2019-10-28 ENCOUNTER — Other Ambulatory Visit: Payer: Self-pay

## 2019-10-28 ENCOUNTER — Ambulatory Visit: Payer: Medicare Other | Admitting: Occupational Therapy

## 2019-10-28 DIAGNOSIS — M25631 Stiffness of right wrist, not elsewhere classified: Secondary | ICD-10-CM

## 2019-10-28 DIAGNOSIS — M79641 Pain in right hand: Secondary | ICD-10-CM

## 2019-10-28 DIAGNOSIS — R600 Localized edema: Secondary | ICD-10-CM

## 2019-10-28 DIAGNOSIS — M79642 Pain in left hand: Secondary | ICD-10-CM

## 2019-10-28 DIAGNOSIS — M6281 Muscle weakness (generalized): Secondary | ICD-10-CM

## 2019-10-28 DIAGNOSIS — M25632 Stiffness of left wrist, not elsewhere classified: Secondary | ICD-10-CM

## 2019-10-28 NOTE — Therapy (Signed)
[facility] [address] [place], [place], [address] Phone: [phone]   Fax:  [phone]  Occupational Therapy Treatment  Patient Details  Name: Kiara Holt MRN: [id number] Date of Birth: 19-Jul-1951 Referring Provider (OT): Sanda Linger   Encounter Date: 10/28/2019  OT End of Session - 10/28/19 1514    Visit Number  7    Number of Visits  9    Date for OT Re-Evaluation  11/20/19    Authorization Time Period  Need PN every 10th visit    Authorization - Visit Number  7    Authorization - Number of Visits  9    Progress Note Due on Visit  10    OT Start Time  1230    OT Stop Time  1315    OT Time Calculation (min)  45 min    Activity Tolerance  Patient tolerated treatment well    Behavior During Therapy  [facility] for tasks assessed/performed       Past Medical History:  Diagnosis Date  . Arthritis    shoulder    Past Surgical History:  Procedure Laterality Date  . ABDOMINAL HYSTERECTOMY  1972  . COLONOSCOPY      There were no vitals filed for this visit.  Subjective Assessment - 10/28/19 1232    Subjective   Patient indicates that she does not experience pain in her fingers anymore - now feels pins and needles in wrists    Currently in Pain?  Yes    Pain Score  4     Pain Location  Wrist    Pain Orientation  Right;Left    Pain Descriptors / Indicators  Pins and needles    Pain Type  Chronic pain    Pain Radiating Towards  base of hand, volar wrist, distal forearm    Pain Onset  In the past 7 days    Pain Frequency  Intermittent    Aggravating Factors   cramping at night    Pain Relieving Factors  rest, massage                   OT Treatments/Exercises (OP) - 10/28/19 0001      Ultrasound   Ultrasound Location  volar wrist, distal forearm, base of thumb    Ultrasound Parameters  , continuous, 0.8 w/cm2, x 8 min left and right wrist    Ultrasound Goals  Pain      Manual  Therapy   Soft tissue mobilization  adrressed tension in tissue between radius and ulna, and across wrist.  Patient with report of 0/10 pain at end of session.  Discussed the benefit of speaking with MD about pain relief - safe options.               OT Education - 10/28/19 1513    Education Details  Encouraged patient to speak with MD about safe pain relief options.  Patient is not interested in any prescription medications, but would consider OTC    Person(s) Educated  Patient    Methods  Explanation    Comprehension  Verbalized understanding          OT Long Term Goals - 10/26/19 1359      OT LONG TERM GOAL #1   Title  Patient will complete a home exercise program to help improve range of motion and build strength in BUE's    Status  Achieved      OT LONG TERM GOAL #  2   Title  Patient will demonstrate improved right wrist active extension to 60 degrees to asssit with functional grip, and return to weight training activity    Status  Achieved      OT LONG TERM GOAL #3   Title  Patient will demonstrate improved ability to carry 25 lb crate with two hands without increase in pain symptoms    Status  On-going      OT LONG TERM GOAL #4   Title  Patient will demo understanding of benefit of rest and exercise,and edema management to limit CTS symptoms    Status  On-going      OT LONG TERM GOAL #5   Title  Patient will demonstrate awareness of splint wear and care    Status  Achieved            Plan - 10/28/19 1515    Clinical Impression Statement  Patient has complete relief of symptoms at end of therapy session, but symptoms flare each evening.    OT Frequency  2x / week    OT Duration  4 weeks    OT Treatment/Interventions  Self-care/ADL training;Electrical Stimulation;Iontophoresis;Therapeutic exercise;Moist Heat;Paraffin;Splinting;Patient/family education;Therapeutic activities;Ultrasound;Cryotherapy;Contrast Bath;DME and/or AE instruction;Manual Therapy     Plan  Resplint - gel pad    OT Home Exercise Plan  median nerve glides, FM coord    Consulted and Agree with Plan of Care  Patient       Patient will benefit from skilled therapeutic intervention in order to improve the following deficits and impairments:           Visit Diagnosis: Pain in right hand  Pain in left hand  Muscle weakness (generalized)  Localized edema  Stiffness of left wrist, not elsewhere classified  Stiffness of right wrist, not elsewhere classified    Problem List Patient Active Problem List   Diagnosis Date Noted  . Carpal tunnel syndrome on both sides 06/08/2019  . Hyperlipidemia LDL goal <130 10/27/2018  . Gallstones 03/19/2018  . Esophageal dysphagia 03/18/2018  . GERD with esophagitis 03/18/2018  . Vitamin D deficiency 10/21/2017  . Essential hypertension, benign 02/17/2016  . PAC (premature atrial contraction) 02/10/2015  . Prediabetes 10/31/2011  . Routine general medical examination at a health care facility 10/31/2011  . OSTEOARTHRITIS, SHOULDER, RIGHT 11/29/2009    [name], OTR/L 10/28/2019, 3:17 PM  [facility] [address] [place], [place], [address] Phone: [phone]   Fax:  [phone]  Name: Kiara Holt MRN: [id number] Date of Birth: 06-07-1951

## 2019-11-03 ENCOUNTER — Other Ambulatory Visit: Payer: Self-pay

## 2019-11-03 ENCOUNTER — Ambulatory Visit: Payer: Medicare Other | Admitting: Occupational Therapy

## 2019-11-03 ENCOUNTER — Encounter: Payer: Self-pay | Admitting: Occupational Therapy

## 2019-11-03 DIAGNOSIS — M6281 Muscle weakness (generalized): Secondary | ICD-10-CM

## 2019-11-03 DIAGNOSIS — M79642 Pain in left hand: Secondary | ICD-10-CM

## 2019-11-03 DIAGNOSIS — M79641 Pain in right hand: Secondary | ICD-10-CM | POA: Diagnosis not present

## 2019-11-03 DIAGNOSIS — M25631 Stiffness of right wrist, not elsewhere classified: Secondary | ICD-10-CM

## 2019-11-03 DIAGNOSIS — R600 Localized edema: Secondary | ICD-10-CM

## 2019-11-03 DIAGNOSIS — M25632 Stiffness of left wrist, not elsewhere classified: Secondary | ICD-10-CM

## 2019-11-03 NOTE — Therapy (Signed)
[facility] [address] [place], [place], [address] Phone: [phone]   Fax:  [phone]  Occupational Therapy Treatment  Patient Details  Name: Kiara Holt MRN: [id number] Date of Birth: [birth year] Referring Provider (OT): Scarlette Calico   Encounter Date: 11/03/2019  OT End of Session - 11/03/19 1319    Visit Number  8    Number of Visits  9    Date for OT Re-Evaluation  11/20/19    Authorization Type  [employer] Medicare    Authorization Time Period  Need PN every 10th visit    Authorization - Visit Number  8    Authorization - Number of Visits  9    Progress Note Due on Visit  10    OT Start Time  1230    OT Stop Time  1315    OT Time Calculation (min)  45 min    Activity Tolerance  Patient tolerated treatment well    Behavior During Therapy  [facility] for tasks assessed/performed       Past Medical History:  Diagnosis Date  . Arthritis    shoulder    Past Surgical History:  Procedure Laterality Date  . ABDOMINAL HYSTERECTOMY  1972  . COLONOSCOPY      There were no vitals filed for this visit.  Subjective Assessment - 11/03/19 1232    Subjective   My hands aren't hurting like they used to.    Pertinent History  OA Right shoulder    Currently in Pain?  Yes    Pain Score  2     Pain Location  Wrist    Pain Orientation  Right;Left    Pain Descriptors / Indicators  Aching    Pain Type  Chronic pain    Pain Radiating Towards  base of hand, volar wrist    Pain Onset  In the past 7 days    Pain Frequency  Intermittent    Aggravating Factors   cramping at night    Pain Relieving Factors  rest, massage         [facility] OT Assessment - 11/03/19 0001      Hand Function   Right Hand Grip (lbs)  30    Left Hand Grip (lbs)  30               OT Treatments/Exercises (OP) - 11/03/19 0001      ADLs   Functional Mobility  Working to begin Financial controller.  Patient had goal to carry 25 lb crate as  needed for unloading groceries - case of juice.  Patient abelt Irineo Axon a 26 lb weighted crate x 50 feet without discomfort.        Hand Exercises   Other Hand Exercises  Putty- yelow exercises - see oatient instructions             OT Education - 11/03/19 1319    Education Details  putty exercises - benefits of moist heat and where to locate    Person(s) Educated  Patient    Methods  Explanation;Demonstration;Tactile cues;Verbal cues;Handout    Comprehension  Verbalized understanding          OT Long Term Goals - 10/26/19 1359      OT LONG TERM GOAL #1   Title  Patient will complete a home exercise program to help improve range of motion and build strength in BUE's    Status  Achieved      OT LONG TERM  GOAL #2   Title  Patient will demonstrate improved right wrist active extension to 60 degrees to asssit with functional grip, and return to weight training activity    Status  Achieved      OT LONG TERM GOAL #3   Title  Patient will demonstrate improved ability to carry 25 lb crate with two hands without increase in pain symptoms    Status  On-going      OT LONG TERM GOAL #4   Title  Patient will demo understanding of benefit of rest and exercise,and edema management to limit CTS symptoms    Status  On-going      OT LONG TERM GOAL #5   Title  Patient will demonstrate awareness of splint wear and care    Status  Achieved            Plan - 11/03/19 1320    Clinical Impression Statement  Patient appears on track to discharge next visit as planned.  Patient with overall reduction in pain symptoms and improved knowledge of pain/edema management techniques.    OT Frequency  2x / week    OT Duration  4 weeks    OT Treatment/Interventions  Self-care/ADL training;Electrical Stimulation;Iontophoresis;Therapeutic exercise;Moist Heat;Paraffin;Splinting;Patient/family education;Therapeutic activities;Ultrasound;Cryotherapy;Contrast Bath;DME and/or AE instruction;Manual  Therapy    Plan  final goals, check HEP putty, hand strengthening    OT Home Exercise Plan  median nerve glides, FM coord, putty    Consulted and Agree with Plan of Care  Patient       Patient will benefit from skilled therapeutic intervention in order to improve the following deficits and impairments:           Visit Diagnosis: Pain in right hand  Pain in left hand  Muscle weakness (generalized)  Localized edema  Stiffness of left wrist, not elsewhere classified  Stiffness of right wrist, not elsewhere classified    Problem List Patient Active Problem List   Diagnosis Date Noted  . Carpal tunnel syndrome on both sides 06/08/2019  . Hyperlipidemia LDL goal <130 10/27/2018  . Gallstones 03/19/2018  . Esophageal dysphagia 03/18/2018  . GERD with esophagitis 03/18/2018  . Vitamin D deficiency 10/21/2017  . Essential hypertension, benign 02/17/2016  . PAC (premature atrial contraction) 02/10/2015  . Prediabetes 10/31/2011  . Routine general medical examination at a health care facility 10/31/2011  . OSTEOARTHRITIS, SHOULDER, RIGHT 11/29/2009    Collier Salina, OTR/L 11/03/2019, 1:23 PM  [facility] [address] [place], [place], [address] Phone: [phone]   Fax:  [phone]  Name: Kiara Holt MRN: [id number] Date of Birth: [birth year]

## 2019-11-03 NOTE — Patient Instructions (Signed)
11. Grip Strengthening (Resistive Putty)   Squeeze putty using thumb and all fingers. Repeat 10 times each hand. Do 1-2 sessions per day.   Extension (Assistive Putty)   Roll putty back and forth, being sure to use all fingertips. Repeat 3 times. Do 1-2 sessions per day.  Then pinch as below.   Palmar Pinch Strengthening (Resistive Putty)   Pinch putty between thumb and each fingertip in turn after rolling out          IP Fisting (Resistive Putty)   Keeping knuckles straight, bend fingertips to squeeze putty. Repeat 10 times. Do 1-2 sessions per day.  MP Flexion (Resistive Putty)   Bending only at large knuckles, press putty down against thumb. Keep fingertips straight. Repeat 10 times. Do 1-2 sessions per day.   Lateral Pinch Strengthening (Resistive Putty)    Squeeze between thumb and side of each finger in turn. Repeat 10 times. Do 1-2 sessions per day.

## 2019-11-05 ENCOUNTER — Ambulatory Visit: Payer: Medicare Other | Attending: Internal Medicine | Admitting: Occupational Therapy

## 2019-11-05 ENCOUNTER — Other Ambulatory Visit: Payer: Self-pay

## 2019-11-05 ENCOUNTER — Encounter: Payer: Self-pay | Admitting: Occupational Therapy

## 2019-11-05 DIAGNOSIS — M25631 Stiffness of right wrist, not elsewhere classified: Secondary | ICD-10-CM | POA: Diagnosis present

## 2019-11-05 DIAGNOSIS — R600 Localized edema: Secondary | ICD-10-CM | POA: Diagnosis present

## 2019-11-05 DIAGNOSIS — M79641 Pain in right hand: Secondary | ICD-10-CM | POA: Diagnosis present

## 2019-11-05 DIAGNOSIS — M25632 Stiffness of left wrist, not elsewhere classified: Secondary | ICD-10-CM | POA: Diagnosis present

## 2019-11-05 DIAGNOSIS — M79642 Pain in left hand: Secondary | ICD-10-CM | POA: Diagnosis present

## 2019-11-05 DIAGNOSIS — M6281 Muscle weakness (generalized): Secondary | ICD-10-CM | POA: Diagnosis present

## 2019-11-05 NOTE — Therapy (Signed)
[facility] [address] [place], [place], [address] Phone: [phone]   Fax:  [phone]  Occupational Therapy Treatment  Patient Details  Name: Kiara Holt MRN: [id number] Date of Birth: 18-Aug-1950 Referring Provider (OT): Scarlette Calico   Encounter Date: 11/05/2019  OT End of Session - 11/05/19 1301    Visit Number  9    Number of Visits  9    Date for OT Re-Evaluation  11/20/19    Authorization Type  [employer] Medicare    Authorization Time Period  Need PN every 10th visit    Authorization - Visit Number  9    Authorization - Number of Visits  9    Progress Note Due on Visit  10    OT Start Time  1230    OT Stop Time  1300    OT Time Calculation (min)  30 min    Activity Tolerance  Patient tolerated treatment well    Behavior During Therapy  [facility] for tasks assessed/performed       Past Medical History:  Diagnosis Date  . Arthritis    shoulder    Past Surgical History:  Procedure Laterality Date  . ABDOMINAL HYSTERECTOMY  1972  . COLONOSCOPY      There were no vitals filed for this visit.  Subjective Assessment - 11/05/19 1231    Subjective   I hardly notice the pain anymore.    Currently in Pain?  Yes    Pain Score  1     Pain Location  Wrist    Pain Orientation  Right;Left    Pain Descriptors / Indicators  Aching    Pain Radiating Towards  Volar wrist    Pain Onset  In the past 7 days    Pain Frequency  Intermittent    Aggravating Factors   cramping at night    Pain Relieving Factors  rest, massage         [facility] OT Assessment - 11/05/19 0001      Hand Function   Right Hand Lateral Pinch  15 lbs    Right Hand 3 Point Pinch  13 lbs    Left Hand Lateral Pinch  14 lbs    Left 3 point pinch  12 lbs               OT Treatments/Exercises (OP) - 11/05/19 0001      Wrist Exercises   Wrist Extension  Strengthening;Both;10 reps;Bar weights/barbell    Bar Weights/Barbell (Wrist  Extension)  4 lbs   then 5lb     Hand Exercises   Other Hand Exercises  Upgraded to red putty    Other Hand Exercises  Digiflex as mass grasp at 5,7lb. x 10 reps each hand             OT Education - 11/05/19 1300    Education Details  reviewed progress and plan to discharge    Person(s) Educated  Patient    Methods  Explanation    Comprehension  Verbalized understanding          OT Long Term Goals - 11/05/19 1233      OT LONG TERM GOAL #1   Title  Patient will complete a home exercise program to help improve range of motion and build strength in BUE's    Status  Achieved      OT LONG TERM GOAL #2   Title  Patient will demonstrate improved right wrist active  extension to 60 degrees to asssit with functional grip, and return to weight training activity    Status  Achieved      OT LONG TERM GOAL #3   Status  Achieved      OT LONG TERM GOAL #4   Title  Patient will demo understanding of benefit of rest and exercise,and edema management to limit CTS symptoms    Status  Achieved      OT LONG TERM GOAL #5   Title  Patient will demonstrate awareness of splint wear and care    Status  Achieved            Plan - 11/05/19 1301    Clinical Impression Statement  Patient is ready for OT discharge.  She has met and exceeded her OT goals.  She is not symptom free, but symptoms significantly reduced from initial evalation.  Patient very pleased with her progress and her ability to manage her symptoms.    OT Frequency  2x / week    OT Duration  4 weeks    OT Treatment/Interventions  Self-care/ADL training;Electrical Stimulation;Iontophoresis;Therapeutic exercise;Moist Heat;Paraffin;Splinting;Patient/family education;Therapeutic activities;Ultrasound;Cryotherapy;Contrast Bath;DME and/or AE instruction;Manual Therapy    Plan  discharge    OT Home Exercise Plan  median nerve glides, FM coord, putty    Consulted and Agree with Plan of Care  Patient       Patient will  benefit from skilled therapeutic intervention in order to improve the following deficits and impairments:           Visit Diagnosis: Pain in right hand  Pain in left hand  Muscle weakness (generalized)  Localized edema  Stiffness of left wrist, not elsewhere classified  Stiffness of right wrist, not elsewhere classified    Problem List Patient Active Problem List   Diagnosis Date Noted  . Carpal tunnel syndrome on both sides 06/08/2019  . Hyperlipidemia LDL goal <130 10/27/2018  . Gallstones 03/19/2018  . Esophageal dysphagia 03/18/2018  . GERD with esophagitis 03/18/2018  . Vitamin D deficiency 10/21/2017  . Essential hypertension, benign 02/17/2016  . PAC (premature atrial contraction) 02/10/2015  . Prediabetes 10/31/2011  . Routine general medical examination at a health care facility 10/31/2011  . OSTEOARTHRITIS, SHOULDER, RIGHT 11/29/2009   OCCUPATIONAL THERAPY DISCHARGE SUMMARY  Visits from Start of Care: 9 Current functional level related to goals / functional outcomes: Able to use BUE functionally with minimal report of pain   Remaining deficits: Minor cramping at night   Education / Equipment: HEP, Edema management Plan: Patient agrees to discharge.  Patient goals were partially met. Patient is being discharged due to meeting the stated rehab goals.  ?????     [name], OTR/L 11/05/2019, 1:03 PM  [facility] [address] [place], [place], [address] Phone: [phone]   Fax:  [phone]  Name: Kiara Holt MRN: [id number] Date of Birth: 04/17/1951

## 2019-11-26 ENCOUNTER — Other Ambulatory Visit: Payer: Self-pay

## 2019-11-26 ENCOUNTER — Encounter: Payer: Self-pay | Admitting: Internal Medicine

## 2019-11-26 ENCOUNTER — Ambulatory Visit (INDEPENDENT_AMBULATORY_CARE_PROVIDER_SITE_OTHER): Payer: Medicare Other | Admitting: Internal Medicine

## 2019-11-26 VITALS — BP 164/86 | HR 99 | Temp 98.5°F | Resp 16 | Ht 66.0 in | Wt 176.0 lb

## 2019-11-26 DIAGNOSIS — E559 Vitamin D deficiency, unspecified: Secondary | ICD-10-CM

## 2019-11-26 DIAGNOSIS — Z0000 Encounter for general adult medical examination without abnormal findings: Secondary | ICD-10-CM

## 2019-11-26 DIAGNOSIS — E785 Hyperlipidemia, unspecified: Secondary | ICD-10-CM | POA: Diagnosis not present

## 2019-11-26 DIAGNOSIS — K2100 Gastro-esophageal reflux disease with esophagitis, without bleeding: Secondary | ICD-10-CM

## 2019-11-26 DIAGNOSIS — I10 Essential (primary) hypertension: Secondary | ICD-10-CM

## 2019-11-26 DIAGNOSIS — R7303 Prediabetes: Secondary | ICD-10-CM

## 2019-11-26 LAB — URINALYSIS, ROUTINE W REFLEX MICROSCOPIC
Bilirubin Urine: NEGATIVE
Hgb urine dipstick: NEGATIVE
Nitrite: NEGATIVE
RBC / HPF: NONE SEEN (ref 0–?)
Specific Gravity, Urine: 1.02 (ref 1.000–1.030)
Total Protein, Urine: NEGATIVE
Urine Glucose: NEGATIVE
Urobilinogen, UA: 0.2 (ref 0.0–1.0)
pH: 6.5 (ref 5.0–8.0)

## 2019-11-26 LAB — BASIC METABOLIC PANEL
BUN: 11 mg/dL (ref 6–23)
CO2: 36 mEq/L — ABNORMAL HIGH (ref 19–32)
Calcium: 9.8 mg/dL (ref 8.4–10.5)
Chloride: 94 mEq/L — ABNORMAL LOW (ref 96–112)
Creatinine, Ser: 0.77 mg/dL (ref 0.40–1.20)
GFR: 90.52 mL/min (ref >60.00)
Glucose, Bld: 111 mg/dL — ABNORMAL HIGH (ref 70–99)
Potassium: 3.5 mEq/L (ref 3.5–5.1)
Sodium: 136 mEq/L (ref 135–145)

## 2019-11-26 LAB — CBC WITH DIFFERENTIAL/PLATELET
Basophils Absolute: 0 10*3/uL (ref 0.0–0.1)
Basophils Relative: 0.4 % (ref 0.0–3.0)
Eosinophils Absolute: 0.1 10*3/uL (ref 0.0–0.7)
Eosinophils Relative: 1.3 % (ref 0.0–5.0)
HCT: 39.4 % (ref 36.0–46.0)
Hemoglobin: 12.7 g/dL (ref 12.0–15.0)
Lymphocytes Relative: 27.5 % (ref 12.0–46.0)
Lymphs Abs: 1.4 10*3/uL (ref 0.7–4.0)
MCHC: 32.2 g/dL (ref 30.0–36.0)
MCV: 84.2 fl (ref 78.0–100.0)
Monocytes Absolute: 0.7 10*3/uL (ref 0.1–1.0)
Monocytes Relative: 14.4 % — ABNORMAL HIGH (ref 3.0–12.0)
Neutro Abs: 2.9 10*3/uL (ref 1.4–7.7)
Neutrophils Relative %: 56.4 % (ref 43.0–77.0)
Platelets: 231 10*3/uL (ref 150.0–400.0)
RBC: 4.68 Mil/uL (ref 3.87–5.11)
RDW: 15.2 % (ref 11.5–15.5)
WBC: 5.1 10*3/uL (ref 4.0–10.5)

## 2019-11-26 LAB — LIPID PANEL
Cholesterol: 208 mg/dL — ABNORMAL HIGH (ref 0–200)
HDL: 57.9 mg/dL (ref >39.00)
LDL Cholesterol: 134 mg/dL — ABNORMAL HIGH (ref 0–99)
NonHDL: 149.93
Total CHOL/HDL Ratio: 4
Triglycerides: 79 mg/dL (ref 0.0–149.0)
VLDL: 15.8 mg/dL (ref 0.0–40.0)

## 2019-11-26 LAB — HEPATIC FUNCTION PANEL
ALT: 20 U/L (ref 0–35)
AST: 25 U/L (ref 0–37)
Albumin: 4.5 g/dL (ref 3.5–5.2)
Alkaline Phosphatase: 58 U/L (ref 39–117)
Bilirubin, Direct: 0.1 mg/dL (ref 0.0–0.3)
Total Bilirubin: 0.6 mg/dL (ref 0.2–1.2)
Total Protein: 7.8 g/dL (ref 6.0–8.3)

## 2019-11-26 LAB — VITAMIN D 25 HYDROXY (VIT D DEFICIENCY, FRACTURES): VITD: 50.3 ng/mL (ref 30.00–100.00)

## 2019-11-26 LAB — HEMOGLOBIN A1C: Hgb A1c MFr Bld: 6.1 % (ref 4.6–6.5)

## 2019-11-26 LAB — TSH: TSH: 1.67 u[IU]/mL (ref 0.35–4.50)

## 2019-11-26 MED ORDER — NEBIVOLOL HCL 5 MG PO TABS: 5.0000 mg | ORAL_TABLET | Freq: Every day | ORAL | 0 refills | Status: DC

## 2019-11-26 NOTE — Progress Notes (Signed)
Subjective:  Patient ID: Kiara Holt, female    DOB: June 25, 1951  Age: 69 y.o. MRN: [id number]  CC: Annual Exam, Hypertension, and Hyperlipidemia  This visit occurred during the SARS-CoV-2 public health emergency.  Safety protocols were in place, including screening questions prior to the visit, additional usage of staff PPE, and extensive cleaning of exam room while observing appropriate contact time as indicated for disinfecting solutions.    HPI Kiara Holt presents for a CPX.  She tells me she is compliant with indapamide but does not monitor her blood pressure.  She is active and denies any recent episodes of headache, blurred vision, DOE, CP, palpitations, edema, or fatigue.  She tells me she is not taking the statin because it made her feel like a zombie.  She says it did not cause muscle or joint aches.  Outpatient Medications Prior to Visit  Medication Sig Dispense Refill  . indapamide (LOZOL) 1.25 MG tablet TAKE 1 TABLET(1.25 MG) BY MOUTH DAILY 90 tablet 0  . Cholecalciferol 2000 units TABS Take 1 tablet (2,000 Units total) by mouth daily. 90 tablet 1  . rosuvastatin (CRESTOR) 10 MG tablet Take 1 tablet (10 mg total) by mouth daily. 90 tablet 1   No facility-administered medications prior to visit.    ROS Review of Systems  Constitutional: Negative for appetite change, diaphoresis, fatigue and unexpected weight change.  HENT: Negative.   Eyes: Negative for visual disturbance.  Respiratory: Negative for choking, chest tightness, shortness of breath, wheezing and stridor.   Cardiovascular: Negative for chest pain, palpitations and leg swelling.  Gastrointestinal: Negative for abdominal pain, blood in stool, constipation, diarrhea, nausea and vomiting.  Endocrine: Negative.   Genitourinary: Negative.  Negative for difficulty urinating, dysuria and hematuria.  Musculoskeletal: Negative for arthralgias, back pain and myalgias.  Skin: Negative.  Negative for color  change, pallor and rash.  Neurological: Negative.  Negative for dizziness, weakness and light-headedness.  Hematological: Negative for adenopathy. Does not bruise/bleed easily.  Psychiatric/Behavioral: Negative.     Objective:  BP (!) 164/86 (BP Location: Left Arm, Patient Position: Sitting, Cuff Size: Large)   Pulse 99   Temp 98.5 F (36.9 C) (Oral)   Resp 16   Ht 5\' 6"  (1.676 m)   Wt 176 lb (79.8 kg)   SpO2 97%   BMI 28.41 kg/m   BP Readings from Last 3 Encounters:  11/26/19 (!) 164/86  06/08/19 (!) 164/84  10/27/18 (!) 172/94    Wt Readings from Last 3 Encounters:  11/26/19 176 lb (79.8 kg)  06/08/19 172 lb (78 kg)  10/27/18 179 lb (81.2 kg)    Physical Exam Vitals reviewed.  Constitutional:      Appearance: Normal appearance.  HENT:     Nose: Nose normal.     Mouth/Throat:     Mouth: Mucous membranes are moist.  Eyes:     General: No scleral icterus.    Conjunctiva/sclera: Conjunctivae normal.  Cardiovascular:     Rate and Rhythm: Normal rate and regular rhythm. Occasional extrasystoles are present.    Heart sounds: No murmur.     Comments: EKG - Sinus rhythm with PAC's, 87 bpm No LVH Otherwise normal EKG Pulmonary:     Effort: Pulmonary effort is normal.     Breath sounds: No stridor. No wheezing, rhonchi or rales.  Abdominal:     General: Abdomen is flat. Bowel sounds are normal. There is no distension.     Palpations: Abdomen is soft. There is no  hepatomegaly, splenomegaly or mass.     Tenderness: There is no abdominal tenderness.  Musculoskeletal:     Cervical back: Neck supple.     Right lower leg: No edema.     Left lower leg: No edema.  Lymphadenopathy:     Cervical: No cervical adenopathy.  Skin:    General: Skin is warm and dry.     Findings: No lesion.  Neurological:     General: No focal deficit present.     Mental Status: She is alert.  Psychiatric:        Mood and Affect: Mood normal.        Behavior: Behavior normal.     Lab  Results  Component Value Date   WBC 5.1 11/26/2019   HGB 12.7 11/26/2019   HCT 39.4 11/26/2019   PLT 231.0 11/26/2019   GLUCOSE 111 (H) 11/26/2019   CHOL 208 (H) 11/26/2019   TRIG 79.0 11/26/2019   HDL 57.90 11/26/2019   LDLDIRECT 137.1 11/17/2008   LDLCALC 134 (H) 11/26/2019   ALT 20 11/26/2019   AST 25 11/26/2019   NA 136 11/26/2019   K 3.5 11/26/2019   CL 94 (L) 11/26/2019   CREATININE 0.77 11/26/2019   BUN 11 11/26/2019   CO2 36 (H) 11/26/2019   TSH 1.67 11/26/2019   HGBA1C 6.1 11/26/2019    MM 3D SCREEN BREAST BILATERAL  Result Date: 08/11/2019 CLINICAL DATA:  Screening. EXAM: DIGITAL SCREENING BILATERAL MAMMOGRAM WITH TOMO AND CAD COMPARISON:  Previous exam(s). ACR Breast Density Category c: The breast tissue is heterogeneously dense, which may obscure small masses. FINDINGS: There are no findings suspicious for malignancy. Images were processed with CAD. IMPRESSION: No mammographic evidence of malignancy. A result letter of this screening mammogram will be mailed directly to the patient. RECOMMENDATION: Screening mammogram in one year. (Code:SM-B-01Y) BI-RADS CATEGORY  1: Negative. Electronically Signed   By: Ammie Ferrier M.D.   On: 08/11/2019 08:40    Assessment & Plan:   Kiara Holt was seen today for annual exam, hypertension and hyperlipidemia.  Diagnoses and all orders for this visit:  Essential hypertension, benign- Her blood pressure is not adequately well controlled.  Her labs are negative for secondary causes or endorgan damage.  I have asked her to continue to work on her lifestyle modifications.  She will continue taking indapamide and have asked her to add nebivolol. -     CBC with Differential/Platelet; Future -     TSH; Future -     Urinalysis, Routine w reflex microscopic; Future -     EKG 12-Lead -     nebivolol (BYSTOLIC) 5 MG tablet; Take 1 tablet (5 mg total) by mouth daily. -     Urinalysis, Routine w reflex microscopic -     TSH -     CBC with  Differential/Platelet  Gastroesophageal reflux disease with esophagitis without hemorrhage- She has no symptoms or complications related to this..  Treatment is not indicated. -     CBC with Differential/Platelet; Future -     CBC with Differential/Platelet  Hyperlipidemia LDL goal <130- She has a mildly elevated ASCVD risk score.  I have asked her to take the statin.  I have informed her that rosuvastatin does not cause palpitations or the sensation of feeling like a zombie. -     Lipid panel; Future -     Hepatic function panel; Future -     TSH; Future -     TSH -  Hepatic function panel -     Lipid panel -     rosuvastatin (CRESTOR) 10 MG tablet; Take 1 tablet (10 mg total) by mouth daily.  Prediabetes- Her A1c is at 6.1%.  Medical therapy is not indicated. -     Basic metabolic panel; Future -     Hemoglobin A1c; Future -     Hemoglobin A1c -     Basic metabolic panel  Routine general medical examination at a health care facility- Exam completed, labs reviewed, she refused vaccines for pneumonia/influenza/Covid-19, cervical cancer screening is not indicated, screening for breast and colon cancer is up-to-date, patient education was given.  Vitamin D deficiency -     VITAMIN D 25 Hydroxy (Vit-D Deficiency, Fractures); Future -     VITAMIN D 25 Hydroxy (Vit-D Deficiency, Fractures)   I have discontinued Kiara Holt's Cholecalciferol. I am also having her start on nebivolol. Additionally, I am having her maintain her indapamide and rosuvastatin.  Meds ordered this encounter  Medications  . nebivolol (BYSTOLIC) 5 MG tablet    Sig: Take 1 tablet (5 mg total) by mouth daily.    Dispense:  90 tablet    Refill:  0  . rosuvastatin (CRESTOR) 10 MG tablet    Sig: Take 1 tablet (10 mg total) by mouth daily.    Dispense:  90 tablet    Refill:  1     Follow-up: Return in about 3 months (around 02/25/2020).  Sanda Linger, MD

## 2019-11-26 NOTE — Patient Instructions (Signed)
Health Maintenance, Female Adopting a healthy lifestyle and getting preventive care are important in promoting health and wellness. Ask your health care provider about:  The right schedule for you to have regular tests and exams.  Things you can do on your own to prevent diseases and keep yourself healthy. What should I know about diet, weight, and exercise? Eat a healthy diet   Eat a diet that includes plenty of vegetables, fruits, low-fat dairy products, and lean protein.  Do not eat a lot of foods that are high in solid fats, added sugars, or sodium. Maintain a healthy weight Body mass index (BMI) is used to identify weight problems. It estimates body fat based on height and weight. Your health care provider can help determine your BMI and help you achieve or maintain a healthy weight. Get regular exercise Get regular exercise. This is one of the most important things you can do for your health. Most adults should:  Exercise for at least 150 minutes each week. The exercise should increase your heart rate and make you sweat (moderate-intensity exercise).  Do strengthening exercises at least twice a week. This is in addition to the moderate-intensity exercise.  Spend less time sitting. Even light physical activity can be beneficial. Watch cholesterol and blood lipids Have your blood tested for lipids and cholesterol at 67 years of age, then have this test every 5 years. Have your cholesterol levels checked more often if:  Your lipid or cholesterol levels are high.  You are older than 67 years of age.  You are at high risk for heart disease. What should I know about cancer screening? Depending on your health history and family history, you may need to have cancer screening at various ages. This may include screening for:  Breast cancer.  Cervical cancer.  Colorectal cancer.  Skin cancer.  Lung cancer. What should I know about heart disease, diabetes, and high blood  pressure? Blood pressure and heart disease  High blood pressure causes heart disease and increases the risk of stroke. This is more likely to develop in people who have high blood pressure readings, are of African descent, or are overweight.  Have your blood pressure checked: ? Every 3-5 years if you are 18-39 years of age. ? Every year if you are 40 years old or older. Diabetes Have regular diabetes screenings. This checks your fasting blood sugar level. Have the screening done:  Once every three years after age 40 if you are at a normal weight and have a low risk for diabetes.  More often and at a younger age if you are overweight or have a high risk for diabetes. What should I know about preventing infection? Hepatitis B If you have a higher risk for hepatitis B, you should be screened for this virus. Talk with your health care provider to find out if you are at risk for hepatitis B infection. Hepatitis C Testing is recommended for:  Everyone born from 1945 through 1965.  Anyone with known risk factors for hepatitis C. Sexually transmitted infections (STIs)  Get screened for STIs, including gonorrhea and chlamydia, if: ? You are sexually active and are younger than 67 years of age. ? You are older than 67 years of age and your health care provider tells you that you are at risk for this type of infection. ? Your sexual activity has changed since you were last screened, and you are at increased risk for chlamydia or gonorrhea. Ask your health care provider if   you are at risk.  Ask your health care provider about whether you are at high risk for HIV. Your health care provider may recommend a prescription medicine to help prevent HIV infection. If you choose to take medicine to prevent HIV, you should first get tested for HIV. You should then be tested every 3 months for as long as you are taking the medicine. Pregnancy  If you are about to stop having your period (premenopausal) and  you may become pregnant, seek counseling before you get pregnant.  Take 400 to 800 micrograms (mcg) of folic acid every day if you become pregnant.  Ask for birth control (contraception) if you want to prevent pregnancy. Osteoporosis and menopause Osteoporosis is a disease in which the bones lose minerals and strength with aging. This can result in bone fractures. If you are 65 years old or older, or if you are at risk for osteoporosis and fractures, ask your health care provider if you should:  Be screened for bone loss.  Take a calcium or vitamin D supplement to lower your risk of fractures.  Be given hormone replacement therapy (HRT) to treat symptoms of menopause. Follow these instructions at home: Lifestyle  Do not use any products that contain nicotine or tobacco, such as cigarettes, e-cigarettes, and chewing tobacco. If you need help quitting, ask your health care provider.  Do not use street drugs.  Do not share needles.  Ask your health care provider for help if you need support or information about quitting drugs. Alcohol use  Do not drink alcohol if: ? Your health care provider tells you not to drink. ? You are pregnant, may be pregnant, or are planning to become pregnant.  If you drink alcohol: ? Limit how much you use to 0-1 drink a day. ? Limit intake if you are breastfeeding.  Be aware of how much alcohol is in your drink. In the [place] one drink equals one 12 oz bottle of beer (355 mL), one 5 oz glass of wine (148 mL), or one 1 oz glass of hard liquor (44 mL). General instructions  Schedule regular health, dental, and eye exams.  Stay current with your vaccines.  Tell your health care provider if: ? You often feel depressed. ? You have ever been abused or do not feel safe at home. Summary  Adopting a healthy lifestyle and getting preventive care are important in promoting health and wellness.  Follow your health care provider's instructions about healthy  diet, exercising, and getting tested or screened for diseases.  Follow your health care provider's instructions on monitoring your cholesterol and blood pressure. This information is not intended to replace advice given to you by your health care provider. Make sure you discuss any questions you have with your health care provider. Document Revised: 07/16/2018 Document Reviewed: 07/16/2018 Elsevier Patient Education  2020 [employer]  

## 2019-11-27 ENCOUNTER — Encounter: Payer: Self-pay | Admitting: Internal Medicine

## 2019-11-27 MED ORDER — ROSUVASTATIN CALCIUM 10 MG PO TABS: 10.0000 mg | ORAL_TABLET | Freq: Every day | ORAL | 1 refills | Status: DC

## 2019-11-27 NOTE — Telephone Encounter (Signed)
   Patient calling to report she can not take rosuvastatin (CRESTOR) 10 MG tablet, gives her irregular heart rate  Please advise

## 2019-12-03 ENCOUNTER — Other Ambulatory Visit: Payer: Self-pay | Admitting: Internal Medicine

## 2019-12-03 DIAGNOSIS — I10 Essential (primary) hypertension: Secondary | ICD-10-CM

## 2019-12-29 ENCOUNTER — Other Ambulatory Visit: Payer: Self-pay | Admitting: Internal Medicine

## 2019-12-29 DIAGNOSIS — I10 Essential (primary) hypertension: Secondary | ICD-10-CM

## 2020-02-25 ENCOUNTER — Encounter: Payer: Self-pay | Admitting: Internal Medicine

## 2020-02-25 ENCOUNTER — Ambulatory Visit: Payer: Medicare Other | Admitting: Internal Medicine

## 2020-02-25 ENCOUNTER — Other Ambulatory Visit: Payer: Self-pay

## 2020-02-25 VITALS — BP 146/94 | HR 111 | Temp 98.5°F | Resp 16 | Ht 66.0 in | Wt 177.0 lb

## 2020-02-25 DIAGNOSIS — I10 Essential (primary) hypertension: Secondary | ICD-10-CM | POA: Diagnosis not present

## 2020-02-25 DIAGNOSIS — R009 Unspecified abnormalities of heart beat: Secondary | ICD-10-CM

## 2020-02-25 DIAGNOSIS — E785 Hyperlipidemia, unspecified: Secondary | ICD-10-CM

## 2020-02-25 NOTE — Progress Notes (Addendum)
Subjective:  Patient ID: GLENNETTE Holt, female    DOB: 04-18-1951  Age: 69 y.o. MRN: [id number]  CC: Hypertension  This visit occurred during the SARS-CoV-2 public health emergency.  Safety protocols were in place, including screening questions prior to the visit, additional usage of staff PPE, and extensive cleaning of exam room while observing appropriate contact time as indicated for disinfecting solutions.    HPI Kiara Holt presents for f/up - She tells me she is not taking any of her medications because she claims that they caused her to feel sleepy and drowsy.  Since she stopped taking them she has felt well and offers no complaints.  She is trying to control her blood pressure and cholesterol level with more lifestyle modifications.  She is active and denies any recent episodes of chest pain, shortness of breath, dyspnea on exertion, headache, blurred vision, palpitations, edema, or fatigue.  Outpatient Medications Prior to Visit  Medication Sig Dispense Refill   BYSTOLIC 5 MG tablet TAKE 1 TABLET(5 MG) BY MOUTH DAILY (Patient not taking: Reported on 02/25/2020) 90 tablet 0   indapamide (LOZOL) 1.25 MG tablet TAKE 1 TABLET(1.25 MG) BY MOUTH DAILY (Patient not taking: Reported on 02/25/2020) 90 tablet 0   rosuvastatin (CRESTOR) 10 MG tablet Take 1 tablet (10 mg total) by mouth daily. (Patient not taking: Reported on 02/25/2020) 90 tablet 1   No facility-administered medications prior to visit.    ROS Review of Systems  Constitutional: Negative for diaphoresis, fatigue and unexpected weight change.  HENT: Negative.   Eyes: Negative for visual disturbance.  Respiratory: Negative for cough, chest tightness, shortness of breath and wheezing.   Cardiovascular: Negative for chest pain, palpitations and leg swelling.  Gastrointestinal: Negative for abdominal pain.  Endocrine: Negative.   Genitourinary: Negative.  Negative for difficulty urinating.  Musculoskeletal: Negative.   Negative for arthralgias and neck stiffness.  Skin: Negative.   Neurological: Negative.  Negative for dizziness, weakness, light-headedness, numbness and headaches.  Hematological: Negative for adenopathy. Does not bruise/bleed easily.  Psychiatric/Behavioral: Negative.     Objective:  BP (!) 146/94    Pulse 111    Temp 98.5 F (36.9 C) (Oral)    Resp 16    Ht 5\' 6"  (1.676 m)    Wt 177 lb (80.3 kg)    SpO2 98%    BMI 28.57 kg/m   BP Readings from Last 3 Encounters:  02/25/20 (!) 146/94  11/26/19 (!) 164/86  06/08/19 (!) 164/84    Wt Readings from Last 3 Encounters:  02/25/20 177 lb (80.3 kg)  11/26/19 176 lb (79.8 kg)  06/08/19 172 lb (78 kg)    Physical Exam Vitals reviewed.  Constitutional:      Appearance: Normal appearance. She is not ill-appearing.  HENT:     Nose: Nose normal.     Mouth/Throat:     Mouth: Mucous membranes are moist.  Eyes:     General: No scleral icterus.    Conjunctiva/sclera: Conjunctivae normal.  Cardiovascular:     Rate and Rhythm: Normal rate and regular rhythm. Occasional extrasystoles are present.    Heart sounds: Normal heart sounds, S1 normal and S2 normal. No murmur heard.  No gallop.      Comments: EKG - NSR, 90 bpm Normal EKG Pulmonary:     Effort: Pulmonary effort is normal.     Breath sounds: No wheezing, rhonchi or rales.  Abdominal:     General: Abdomen is flat.     Tenderness:  There is no abdominal tenderness. There is no guarding.     Hernia: No hernia is present.  Musculoskeletal:     Cervical back: Neck supple.     Right lower leg: No edema.     Left lower leg: No edema.  Lymphadenopathy:     Cervical: No cervical adenopathy.  Skin:    General: Skin is warm and dry.  Neurological:     General: No focal deficit present.     Mental Status: She is alert and oriented to person, place, and time. Mental status is at baseline.  Psychiatric:        Mood and Affect: Mood normal.        Behavior: Behavior normal.      Lab Results  Component Value Date   WBC 5.1 11/26/2019   HGB 12.7 11/26/2019   HCT 39.4 11/26/2019   PLT 231.0 11/26/2019   GLUCOSE 111 (H) 11/26/2019   CHOL 208 (H) 11/26/2019   TRIG 79.0 11/26/2019   HDL 57.90 11/26/2019   LDLDIRECT 137.1 11/17/2008   LDLCALC 134 (H) 11/26/2019   ALT 20 11/26/2019   AST 25 11/26/2019   NA 136 11/26/2019   K 3.5 11/26/2019   CL 94 (L) 11/26/2019   CREATININE 0.77 11/26/2019   BUN 11 11/26/2019   CO2 36 (H) 11/26/2019   TSH 1.67 11/26/2019   HGBA1C 6.1 11/26/2019    MM 3D SCREEN BREAST BILATERAL  Result Date: 08/11/2019 CLINICAL DATA:  Screening. EXAM: DIGITAL SCREENING BILATERAL MAMMOGRAM WITH TOMO AND CAD COMPARISON:  Previous exam(s). ACR Breast Density Category c: The breast tissue is heterogeneously dense, which may obscure small masses. FINDINGS: There are no findings suspicious for malignancy. Images were processed with CAD. IMPRESSION: No mammographic evidence of malignancy. A result letter of this screening mammogram will be mailed directly to the patient. RECOMMENDATION: Screening mammogram in one year. (Code:SM-B-01Y) BI-RADS CATEGORY  1: Negative. Electronically Signed   By: Frederico Hamman M.D.   On: 08/11/2019 08:40    Assessment & Plan:   [name] was seen today for hypertension.  Diagnoses and all orders for this visit:  Abnormal heart rate- Her initial heart rate was 111 and I heard a few extrasystoles.  She has a history of PACs and she is asymptomatic with this.  Her EKG shows a normal heart rate at 90 with no dysrhythmia. -     EKG 12-Lead  Essential hypertension, benign- She is aware that her blood pressure is not adequately well controlled and that she is not likely to achieve blood pressure control without medications.  She is not willing to restart either indapamide or nebivolol.  Hyperlipidemia LDL goal <130- She is aware that she has an elevated ASCVD risk score.  I have asked her to take the statin because I  do not think it is contributing to her symptoms.  She is not willing to agree to this.  She will let me know if and when she changes her mind.   I have discontinued Addison B. Stolarz's rosuvastatin, indapamide, and Bystolic.  No orders of the defined types were placed in this encounter.    Follow-up: Return in about 3 months (around 05/27/2020).  Sanda Linger, MD

## 2020-02-25 NOTE — Patient Instructions (Signed)

## 2020-03-04 ENCOUNTER — Other Ambulatory Visit: Payer: Self-pay | Admitting: Internal Medicine

## 2020-03-04 DIAGNOSIS — I10 Essential (primary) hypertension: Secondary | ICD-10-CM

## 2020-06-28 ENCOUNTER — Other Ambulatory Visit: Payer: Self-pay | Admitting: Internal Medicine

## 2020-06-28 DIAGNOSIS — Z1231 Encounter for screening mammogram for malignant neoplasm of breast: Secondary | ICD-10-CM

## 2020-08-15 ENCOUNTER — Ambulatory Visit: Payer: Medicare Other

## 2020-09-23 ENCOUNTER — Other Ambulatory Visit: Payer: Self-pay

## 2020-09-23 ENCOUNTER — Ambulatory Visit
Admission: RE | Admit: 2020-09-23 | Discharge: 2020-09-23 | Disposition: A | Discharge status: Home / Self Care | Payer: Medicare Other | Source: Ambulatory Visit | Attending: Internal Medicine | Admitting: Internal Medicine

## 2020-09-23 DIAGNOSIS — Z1231 Encounter for screening mammogram for malignant neoplasm of breast: Secondary | ICD-10-CM

## 2020-09-28 LAB — HM MAMMOGRAPHY

## 2021-04-20 ENCOUNTER — Other Ambulatory Visit: Payer: Self-pay

## 2021-04-20 ENCOUNTER — Encounter: Payer: Self-pay | Admitting: Internal Medicine

## 2021-04-20 ENCOUNTER — Ambulatory Visit (INDEPENDENT_AMBULATORY_CARE_PROVIDER_SITE_OTHER): Payer: Medicare Other

## 2021-04-20 VITALS — BP 180/80 | HR 114 | Temp 98.0°F | Ht 66.0 in | Wt 173.6 lb

## 2021-04-20 DIAGNOSIS — Z0000 Encounter for general adult medical examination without abnormal findings: Secondary | ICD-10-CM

## 2021-04-20 NOTE — Progress Notes (Signed)
Subjective:   Kiara Holt is a 70 y.o. female who presents for Medicare Annual (Subsequent) preventive examination.  Review of Systems     Cardiac Risk Factors include: advanced age (>64men, >65 women)     Objective:    Today's Vitals   04/20/21 1302  BP: (!) 180/80  Pulse: (!) 114  Temp: 98 F (36.7 C)  SpO2: 96%  Weight: 173 lb 9.6 oz (78.7 kg)  Height: 5\' 6"  (1.676 m)  PainSc: 0-No pain   Body mass index is 28.02 kg/m.  Advanced Directives 04/20/2021  Does Patient Have a Medical Advance Directive? Yes  Type of Advance Directive Living will;Healthcare Power of Attorney  Does patient want to make changes to medical advance directive? No - Patient declined    Current Medications (verified) Outpatient Encounter Medications as of 04/20/2021  Medication Sig   indapamide (LOZOL) 1.25 MG tablet TAKE 1 TABLET(1.25 MG) BY MOUTH DAILY (Patient not taking: Reported on 04/20/2021)   No facility-administered encounter medications on file as of 04/20/2021.    Allergies (verified) Patient has no known allergies.   History: Past Medical History:  Diagnosis Date   Arthritis    shoulder   Past Surgical History:  Procedure Laterality Date   ABDOMINAL HYSTERECTOMY  1972   COLONOSCOPY     Family History  Problem Relation Age of Onset   Kidney disease Mother    Arthritis Mother    Diabetes Mother    Breast cancer Maternal Grandmother    Cancer Neg Hx    Heart disease Neg Hx    Hyperlipidemia Neg Hx    Hypertension Neg Hx    Stroke Neg Hx    Colon cancer Neg Hx    Social History   Socioeconomic History   Marital status: Married    Spouse name: Not on file   Number of children: Not on file   Years of education: Not on file   Highest education level: Not on file  Occupational History   Not on file  Tobacco Use   Smoking status: Never   Smokeless tobacco: Never  Substance and Sexual Activity   Alcohol use: No   Drug use: No   Sexual activity: Never   Other Topics Concern   Not on file  Social History Narrative   Not on file   Social Determinants of Health   Financial Resource Strain: Low Risk    Difficulty of Paying Living Expenses: Not hard at all  Food Insecurity: No Food Insecurity   Worried About 04/22/2021 in the Last Year: Never true   Ran Out of Food in the Last Year: Never true  Transportation Needs: No Transportation Needs   Lack of Transportation (Medical): No   Lack of Transportation (Non-Medical): No  Physical Activity: Sufficiently Active   Days of Exercise per Week: 3 days   Minutes of Exercise per Session: 60 min  Stress: No Stress Concern Present   Feeling of Stress : Not at all  Social Connections: Socially Integrated   Frequency of Communication with Friends and Family: More than three times a week   Frequency of Social Gatherings with Friends and Family: Once a week   Attends Religious Services: More than 4 times per year   Active Member of Programme researcher, broadcasting/film/video or Organizations: Yes   Attends [employer]: More than 4 times per year   Marital Status: Married    Tobacco Counseling Counseling given: Not Answered   Clinical Intake:  Pre-visit preparation completed: Yes  Pain : No/denies pain Pain Score: 0-No pain     BMI - recorded: 28.02 Nutritional Status: BMI 25 -29 Overweight Nutritional Risks: Other (Comment) Diabetes: Yes CBG done?: No Did pt. bring in CBG monitor from home?: No  How often do you need to have someone help you when you read instructions, pamphlets, or other written materials from your doctor or pharmacy?: 1 - Never What is the last grade level you completed in school?: HSG  Diabetic? no  Interpreter Needed?: No  Information entered by :: Susie Cassette, LPN   Activities of Daily Living In your present state of health, do you have any difficulty performing the following activities: 04/20/2021  Hearing? N  Vision? N  Difficulty concentrating or making  decisions? N  Walking or climbing stairs? N  Dressing or bathing? N  Doing errands, shopping? N  Preparing Food and eating ? N  Using the Toilet? N  In the past six months, have you accidently leaked urine? N  Do you have problems with loss of bowel control? N  Managing your Medications? N  Managing your Finances? N  Housekeeping or managing your Housekeeping? N  Some recent data might be hidden    Patient Care Team: Etta Grandchild, MD as PCP - General (Internal Medicine)  Indicate any recent Medical Services you may have received from other than [facility] providers in the past year (date may be approximate).     Assessment:   This is a routine wellness examination for [name].  Hearing/Vision screen Hearing Screening - Comments:: Patient denied any hearing difficulty. Vision Screening - Comments:: Patient does not wear any corrective lenses.  Annual eye exam done by [facility].  Dietary issues and exercise activities discussed: Current Exercise Habits: Home exercise routine, Type of exercise: strength training/weights;stretching;treadmill;walking;Other - see comments;exercise ball (stationary bike), Time (Minutes): 60, Frequency (Times/Week): 3, Weekly Exercise (Minutes/Week): 180, Intensity: Moderate, Exercise limited by: None identified   Goals Addressed   None   Depression Screen PHQ 2/9 Scores 04/20/2021 11/26/2019 10/21/2017  PHQ - 2 Score 0 0 0    Fall Risk Fall Risk  04/20/2021 11/26/2019 10/27/2018 10/27/2018 03/17/2018  Falls in the past year? 0 0 0 0 No  Number falls in past yr: 0 0 - 0 -  Injury with Fall? 0 0 - 0 -  Risk for fall due to : No Fall Risks No Fall Risks - - -  Follow up Falls evaluation completed Falls evaluation completed - Falls evaluation completed -    FALL RISK PREVENTION PERTAINING TO THE HOME:  Any stairs in or around the home? Yes  If so, are there any without handrails? No  Home free of loose throw rugs in walkways,  pet beds, electrical cords, etc? Yes  Adequate lighting in your home to reduce risk of falls? Yes   ASSISTIVE DEVICES UTILIZED TO PREVENT FALLS:  Life alert? No  Use of a cane, walker or w/c? No  Grab bars in the bathroom? No  Shower chair or bench in shower? No  Elevated toilet seat or a handicapped toilet? Yes   TIMED UP AND GO:  Was the test performed? Yes .  Length of time to ambulate 10 feet: 6 sec.   Gait steady and fast without use of assistive device  Cognitive Function: Normal cognitive status assessed by direct observation by this Nurse Health Advisor. No abnormalities found.  Immunizations Immunization History  Administered Date(s) Administered   Tdap 02/23/2010    TDAP status: Due, Education has been provided regarding the importance of this vaccine. Advised may receive this vaccine at local pharmacy or Health Dept. Aware to provide a copy of the vaccination record if obtained from local pharmacy or Health Dept. Verbalized acceptance and understanding.  Flu Vaccine status: Declined, Education has been provided regarding the importance of this vaccine but patient still declined. Advised may receive this vaccine at local pharmacy or Health Dept. Aware to provide a copy of the vaccination record if obtained from local pharmacy or Health Dept. Verbalized acceptance and understanding.  Pneumococcal vaccine status: Declined,  Education has been provided regarding the importance of this vaccine but patient still declined. Advised may receive this vaccine at local pharmacy or Health Dept. Aware to provide a copy of the vaccination record if obtained from local pharmacy or Health Dept. Verbalized acceptance and understanding.   Covid-19 vaccine status: Declined, Education has been provided regarding the importance of this vaccine but patient still declined. Advised may receive this vaccine at local pharmacy or Health Dept.or vaccine clinic. Aware to provide a copy of  the vaccination record if obtained from local pharmacy or Health Dept. Verbalized acceptance and understanding.  Qualifies for Shingles Vaccine? Yes   Zostavax completed No   Shingrix Completed?: No.    Education has been provided regarding the importance of this vaccine. Patient has been advised to call insurance company to determine out of pocket expense if they have not yet received this vaccine. Advised may also receive vaccine at local pharmacy or Health Dept. Verbalized acceptance and understanding.  Screening Tests Health Maintenance  Topic Date Due   COVID-19 Vaccine (1) Never done   Zoster Vaccines- Shingrix (1 of 2) Never done   DEXA SCAN  Never done   PNA vac Low Risk Adult (1 of 2 - PCV13) Never done   INFLUENZA VACCINE  03/06/2021   TETANUS/TDAP  10/30/2021   MAMMOGRAM  09/28/2022   COLONOSCOPY (Pts 45-66yrs Insurance coverage will need to be confirmed)  09/24/2023   Hepatitis C Screening  Completed   HPV VACCINES  Aged Out    Health Maintenance  Health Maintenance Due  Topic Date Due   COVID-19 Vaccine (1) Never done   Zoster Vaccines- Shingrix (1 of 2) Never done   DEXA SCAN  Never done   PNA vac Low Risk Adult (1 of 2 - PCV13) Never done   INFLUENZA VACCINE  03/06/2021    Colorectal cancer screening: Type of screening: Colonoscopy. Completed 09/23/2013. Repeat every 10 years  Mammogram status: Completed 09/28/2020. Repeat every year  Bone density status: never done  Lung Cancer Screening: (Low Dose CT Chest recommended if Age 22-80 years, 30 pack-year currently smoking OR have quit w/in 15years.) does not qualify.   Lung Cancer Screening Referral: no  Additional Screening:  Hepatitis C Screening: does qualify; Completed yes  Vision Screening: Recommended annual ophthalmology exams for early detection of glaucoma and other disorders of the eye. Is the patient up to date with their annual eye exam?  Yes  Who is the provider or what is the name of the office  in which the patient attends annual eye exams? [facility] If pt is not established with a provider, would they like to be referred to a provider to establish care? No .   Dental Screening: Recommended annual dental exams for proper oral hygiene  [facility]  Referral / Chronic Care Management: CRR required this visit?  No   CCM required this visit?  No      Plan:     I have personally reviewed and noted the following in the patient's chart:   Medical and social history Use of alcohol, tobacco or illicit drugs  Current medications and supplements including opioid prescriptions.  Functional ability and status Nutritional status Physical activity Advanced directives List of other physicians Hospitalizations, surgeries, and ER visits in previous 12 months Vitals Screenings to include cognitive, depression, and falls Referrals and appointments  In addition, I have reviewed and discussed with patient certain preventive protocols, quality metrics, and best practice recommendations. A written personalized care plan for preventive services as well as general preventive health recommendations were provided to patient.     Mickeal Needy, LPN   11/02/5186   Nurse Notes:  Hearing Screening - Comments:: Patient denied any hearing difficulty. Vision Screening - Comments:: Patient does not wear any corrective lenses.  Annual eye exam done by [facility].

## 2021-04-20 NOTE — Patient Instructions (Signed)
Kiara Holt , Thank you for taking time to come for your Medicare Wellness Visit. I appreciate your ongoing commitment to your health goals. Please review the following plan we discussed and let me know if I can assist you in the future.   Screening recommendations/referrals: Colonoscopy: 09/23/2013; due every 10 years Mammogram: 09/28/2020; due every year Bone Density: never done Recommended yearly ophthalmology/optometry visit for glaucoma screening and checkup Recommended yearly dental visit for hygiene and checkup  Vaccinations: Influenza vaccine: declined Pneumococcal vaccine: declined Tdap vaccine: 02/23/2010; due every 10 years (overdue) Shingles vaccine: declined  Covid-19: declined  Advanced directives: Please bring a copy of your health care power of attorney and living will to the office at your convenience.  Conditions/risks identified: Yes; Client understands the importance of follow-up with providers by attending scheduled visits and discussed goals to eat healthier, increase physical activity, exercise the brain, socialize more, get enough sleep and make time for laughter.  Next appointment: Please schedule your next Medicare Wellness Visit with your Nurse Health Advisor in 1 year by calling [phone].   Preventive Care 1 Years and Older, Female Preventive care refers to lifestyle choices and visits with your health care provider that can promote health and wellness. What does preventive care include? A yearly physical exam. This is also called an annual well check. Dental exams once or twice a year. Routine eye exams. Ask your health care provider how often you should have your eyes checked. Personal lifestyle choices, including: Daily care of your teeth and gums. Regular physical activity. Eating a healthy diet. Avoiding tobacco and drug use. Limiting alcohol use. Practicing safe sex. Taking low-dose aspirin every day. Taking vitamin and mineral supplements as  recommended by your health care provider. What happens during an annual well check? The services and screenings done by your health care provider during your annual well check will depend on your age, overall health, lifestyle risk factors, and family history of disease. Counseling  Your health care provider may ask you questions about your: Alcohol use. Tobacco use. Drug use. Emotional well-being. Home and relationship well-being. Sexual activity. Eating habits. History of falls. Memory and ability to understand (cognition). Work and work Astronomer. Reproductive health. Screening  You may have the following tests or measurements: Height, weight, and BMI. Blood pressure. Lipid and cholesterol levels. These may be checked every 5 years, or more frequently if you are over 71 years old. Skin check. Lung cancer screening. You may have this screening every year starting at age 93 if you have a 30-pack-year history of smoking and currently smoke or have quit within the past 15 years. Fecal occult blood test (FOBT) of the stool. You may have this test every year starting at age 53. Flexible sigmoidoscopy or colonoscopy. You may have a sigmoidoscopy every 5 years or a colonoscopy every 10 years starting at age 57. Hepatitis C blood test. Hepatitis B blood test. Sexually transmitted disease (STD) testing. Diabetes screening. This is done by checking your blood sugar (glucose) after you have not eaten for a while (fasting). You may have this done every 1-3 years. Bone density scan. This is done to screen for osteoporosis. You may have this done starting at age 63. Mammogram. This may be done every 1-2 years. Talk to your health care provider about how often you should have regular mammograms. Talk with your health care provider about your test results, treatment options, and if necessary, the need for more tests. Vaccines  Your health care provider may  recommend certain vaccines, such  as: Influenza vaccine. This is recommended every year. Tetanus, diphtheria, and acellular pertussis (Tdap, Td) vaccine. You may need a Td booster every 10 years. Zoster vaccine. You may need this after age 53. Pneumococcal 13-valent conjugate (PCV13) vaccine. One dose is recommended after age 55. Pneumococcal polysaccharide (PPSV23) vaccine. One dose is recommended after age 31. Talk to your health care provider about which screenings and vaccines you need and how often you need them. This information is not intended to replace advice given to you by your health care provider. Make sure you discuss any questions you have with your health care provider. Document Released: 08/19/2015 Document Revised: 04/11/2016 Document Reviewed: 05/24/2015 Elsevier Interactive Patient Education  2017 [place] Prevention in the Home Falls can cause injuries. They can happen to people of all ages. There are many things you can do to make your home safe and to help prevent falls. What can I do on the outside of my home? Regularly fix the edges of walkways and driveways and fix any cracks. Remove anything that might make you trip as you walk through a door, such as a raised step or threshold. Trim any bushes or trees on the path to your home. Use bright outdoor lighting. Clear any walking paths of anything that might make someone trip, such as rocks or tools. Regularly check to see if handrails are loose or broken. Make sure that both sides of any steps have handrails. Any raised decks and porches should have guardrails on the edges. Have any leaves, snow, or ice cleared regularly. Use sand or salt on walking paths during winter. Clean up any spills in your garage right away. This includes oil or grease spills. What can I do in the bathroom? Use night lights. Install grab bars by the toilet and in the tub and shower. Do not use towel bars as grab bars. Use non-skid mats or decals in the tub or  shower. If you need to sit down in the shower, use a plastic, non-slip stool. Keep the floor dry. Clean up any water that spills on the floor as soon as it happens. Remove soap buildup in the tub or shower regularly. Attach bath mats securely with double-sided non-slip rug tape. Do not have throw rugs and other things on the floor that can make you trip. What can I do in the bedroom? Use night lights. Make sure that you have a light by your bed that is easy to reach. Do not use any sheets or blankets that are too big for your bed. They should not hang down onto the floor. Have a firm chair that has side arms. You can use this for support while you get dressed. Do not have throw rugs and other things on the floor that can make you trip. What can I do in the kitchen? Clean up any spills right away. Avoid walking on wet floors. Keep items that you use a lot in easy-to-reach places. If you need to reach something above you, use a strong step stool that has a grab bar. Keep electrical cords out of the way. Do not use floor polish or wax that makes floors slippery. If you must use wax, use non-skid floor wax. Do not have throw rugs and other things on the floor that can make you trip. What can I do with my stairs? Do not leave any items on the stairs. Make sure that there are handrails on both sides of  the stairs and use them. Fix handrails that are broken or loose. Make sure that handrails are as long as the stairways. Check any carpeting to make sure that it is firmly attached to the stairs. Fix any carpet that is loose or worn. Avoid having throw rugs at the top or bottom of the stairs. If you do have throw rugs, attach them to the floor with carpet tape. Make sure that you have a light switch at the top of the stairs and the bottom of the stairs. If you do not have them, ask someone to add them for you. What else can I do to help prevent falls? Wear shoes that: Do not have high heels. Have  rubber bottoms. Are comfortable and fit you well. Are closed at the toe. Do not wear sandals. If you use a stepladder: Make sure that it is fully opened. Do not climb a closed stepladder. Make sure that both sides of the stepladder are locked into place. Ask someone to hold it for you, if possible. Clearly mark and make sure that you can see: Any grab bars or handrails. First and last steps. Where the edge of each step is. Use tools that help you move around (mobility aids) if they are needed. These include: Canes. Walkers. Scooters. Crutches. Turn on the lights when you go into a dark area. Replace any light bulbs as soon as they burn out. Set up your furniture so you have a clear path. Avoid moving your furniture around. If any of your floors are uneven, fix them. If there are any pets around you, be aware of where they are. Review your medicines with your doctor. Some medicines can make you feel dizzy. This can increase your chance of falling. Ask your doctor what other things that you can do to help prevent falls. This information is not intended to replace advice given to you by your health care provider. Make sure you discuss any questions you have with your health care provider. Document Released: 05/19/2009 Document Revised: 12/29/2015 Document Reviewed: 08/27/2014 Elsevier Interactive Patient Education  2017 [employer].

## 2021-04-21 ENCOUNTER — Telehealth: Payer: Self-pay | Admitting: Internal Medicine

## 2021-04-21 NOTE — Telephone Encounter (Signed)
Pt has been scheduled for 9/21 @ 9.20am

## 2021-04-21 NOTE — Telephone Encounter (Signed)
Patient called in to schedule appt w/ provider  to discuss bp concens.. 1st available 10.25.22  Patient wants to know if possible to fit her in anywhere sooner on provider schedule  Please check w/ provider & let patient know

## 2021-04-26 ENCOUNTER — Encounter: Payer: Self-pay | Admitting: Internal Medicine

## 2021-04-26 ENCOUNTER — Ambulatory Visit: Payer: Medicare Other | Admitting: Internal Medicine

## 2021-04-26 ENCOUNTER — Telehealth: Payer: Self-pay | Admitting: Internal Medicine

## 2021-04-26 ENCOUNTER — Other Ambulatory Visit: Payer: Self-pay

## 2021-04-26 VITALS — BP 180/86 | HR 98 | Temp 98.6°F | Resp 16 | Ht 66.0 in | Wt 172.0 lb

## 2021-04-26 DIAGNOSIS — I10 Essential (primary) hypertension: Secondary | ICD-10-CM | POA: Diagnosis not present

## 2021-04-26 DIAGNOSIS — Z0000 Encounter for general adult medical examination without abnormal findings: Secondary | ICD-10-CM

## 2021-04-26 DIAGNOSIS — R7303 Prediabetes: Secondary | ICD-10-CM

## 2021-04-26 DIAGNOSIS — E785 Hyperlipidemia, unspecified: Secondary | ICD-10-CM

## 2021-04-26 DIAGNOSIS — Z23 Encounter for immunization: Secondary | ICD-10-CM

## 2021-04-26 DIAGNOSIS — E559 Vitamin D deficiency, unspecified: Secondary | ICD-10-CM | POA: Diagnosis not present

## 2021-04-26 DIAGNOSIS — I491 Atrial premature depolarization: Secondary | ICD-10-CM

## 2021-04-26 LAB — LIPID PANEL
Cholesterol: 165 mg/dL (ref 0–200)
HDL: 52.6 mg/dL (ref >39.00)
LDL Cholesterol: 99 mg/dL (ref 0–99)
NonHDL: 112.75
Total CHOL/HDL Ratio: 3
Triglycerides: 69 mg/dL (ref 0.0–149.0)
VLDL: 13.8 mg/dL (ref 0.0–40.0)

## 2021-04-26 LAB — BASIC METABOLIC PANEL
BUN: 10 mg/dL (ref 6–23)
CO2: 30 mEq/L (ref 19–32)
Calcium: 10.3 mg/dL (ref 8.4–10.5)
Chloride: 100 mEq/L (ref 96–112)
Creatinine, Ser: 0.79 mg/dL (ref 0.40–1.20)
GFR: 76.95 mL/min (ref >60.00)
Glucose, Bld: 114 mg/dL — ABNORMAL HIGH (ref 70–99)
Potassium: 4.6 mEq/L (ref 3.5–5.1)
Sodium: 139 mEq/L (ref 135–145)

## 2021-04-26 LAB — HEPATIC FUNCTION PANEL
ALT: 17 U/L (ref 0–35)
AST: 22 U/L (ref 0–37)
Albumin: 4.4 g/dL (ref 3.5–5.2)
Alkaline Phosphatase: 60 U/L (ref 39–117)
Bilirubin, Direct: 0.1 mg/dL (ref 0.0–0.3)
Total Bilirubin: 0.4 mg/dL (ref 0.2–1.2)
Total Protein: 8.3 g/dL (ref 6.0–8.3)

## 2021-04-26 LAB — URINALYSIS, ROUTINE W REFLEX MICROSCOPIC
Bilirubin Urine: NEGATIVE
Hgb urine dipstick: NEGATIVE
Ketones, ur: NEGATIVE
Nitrite: NEGATIVE
Specific Gravity, Urine: 1.02 (ref 1.000–1.030)
Total Protein, Urine: NEGATIVE
Urine Glucose: NEGATIVE
Urobilinogen, UA: 0.2 — AB (ref 0.0–1.0)
pH: 6 (ref 5.0–8.0)

## 2021-04-26 LAB — CBC WITH DIFFERENTIAL/PLATELET
Basophils Absolute: 0 10*3/uL (ref 0.0–0.1)
Basophils Relative: 0.6 % (ref 0.0–3.0)
Eosinophils Absolute: 0 10*3/uL (ref 0.0–0.7)
Eosinophils Relative: 1.1 % (ref 0.0–5.0)
HCT: 37.6 % (ref 36.0–46.0)
Hemoglobin: 12.2 g/dL (ref 12.0–15.0)
Lymphocytes Relative: 27.1 % (ref 12.0–46.0)
Lymphs Abs: 1.2 10*3/uL (ref 0.7–4.0)
MCHC: 32.3 g/dL (ref 30.0–36.0)
MCV: 84.9 fl (ref 78.0–100.0)
Monocytes Absolute: 0.5 10*3/uL (ref 0.1–1.0)
Monocytes Relative: 11 % (ref 3.0–12.0)
Neutro Abs: 2.7 10*3/uL (ref 1.4–7.7)
Neutrophils Relative %: 60.2 % (ref 43.0–77.0)
Platelets: 247 10*3/uL (ref 150.0–400.0)
RBC: 4.43 Mil/uL (ref 3.87–5.11)
RDW: 14.4 % (ref 11.5–15.5)
WBC: 4.5 10*3/uL (ref 4.0–10.5)

## 2021-04-26 LAB — HEMOGLOBIN A1C: Hgb A1c MFr Bld: 6.1 % (ref 4.6–6.5)

## 2021-04-26 LAB — TSH: TSH: 2.04 u[IU]/mL (ref 0.35–5.50)

## 2021-04-26 LAB — VITAMIN D 25 HYDROXY (VIT D DEFICIENCY, FRACTURES): VITD: 53.19 ng/mL (ref 30.00–100.00)

## 2021-04-26 MED ORDER — SHINGRIX 50 MCG/0.5ML IM SUSR: 0.5000 mL | Freq: Once | INTRAMUSCULAR | 1 refills | Status: DC

## 2021-04-26 MED ORDER — ROSUVASTATIN CALCIUM 5 MG PO TABS: 5.0000 mg | ORAL_TABLET | Freq: Every day | ORAL | 0 refills | Status: DC

## 2021-04-26 MED ORDER — BOOSTRIX 5-2.5-18.5 LF-MCG/0.5 IM SUSP: 0.5000 mL | Freq: Once | INTRAMUSCULAR | 0 refills | Status: AC

## 2021-04-26 MED ORDER — TRIAMTERENE-HCTZ 37.5-25 MG PO CAPS: 1.0000 | ORAL_CAPSULE | Freq: Every day | ORAL | 0 refills | Status: DC

## 2021-04-26 NOTE — Telephone Encounter (Signed)
Patient calling to discuss why Need for prophylactic vaccination with combined diphtheria-tetanus-pertussis (DTP) vaccine -     Tdap (BOOSTRIX) 5-2.5-18.5 LF-MCG/0.5 injection; Inject 0.5 mLs into the muscle once for 1 dose   She wants to know why this was not discussed during visit

## 2021-04-26 NOTE — Patient Instructions (Signed)

## 2021-04-26 NOTE — Telephone Encounter (Signed)
Pt has been informed the need for vaccination and I have explained to her why it was sent to the pharmacy instead of being administered in office due to her having medicare insurance and incurring a bill because she did not have an injury that would warrant the need vs being sent to the pharmacy and it being covered by her insurance.   Pt stated that she will not proceed with the vaccine and stated she informed PCP during her OV today that she was not interested in taking a statin. However, she would try the dyazide for blood pressure.

## 2021-04-26 NOTE — Progress Notes (Signed)
Subjective:  Patient ID: Kiara Holt, female    DOB: Jan 23, 1951  Age: 70 y.o. MRN: [id number]  CC: Annual Exam and Hypertension  This visit occurred during the SARS-CoV-2 public health emergency.  Safety protocols were in place, including screening questions prior to the visit, additional usage of staff PPE, and extensive cleaning of exam room while observing appropriate contact time as indicated for disinfecting solutions.    HPI Kiara Holt presents for a CPX and f/up -   She is not compliant with indapamide.  She does not monitor her blood pressure.  She denies headache, blurred vision, chest pain, shortness of breath, diaphoresis, edema, or fatigue.  Outpatient Medications Prior to Visit  Medication Sig Dispense Refill   indapamide (LOZOL) 1.25 MG tablet TAKE 1 TABLET(1.25 MG) BY MOUTH DAILY 90 tablet 0   No facility-administered medications prior to visit.    ROS Review of Systems  Constitutional: Negative.  Negative for diaphoresis, fatigue and unexpected weight change.  HENT: Negative.    Eyes: Negative.  Negative for visual disturbance.  Respiratory:  Negative for cough, chest tightness, shortness of breath and wheezing.   Cardiovascular:  Negative for chest pain, palpitations and leg swelling.  Gastrointestinal:  Negative for abdominal pain, constipation, diarrhea, nausea and vomiting.  Endocrine: Negative.   Genitourinary: Negative.  Negative for difficulty urinating, dysuria and hematuria.  Musculoskeletal: Negative.  Negative for back pain and neck pain.  Skin: Negative.   Neurological:  Negative for dizziness, weakness, light-headedness and headaches.  Hematological:  Negative for adenopathy. Does not bruise/bleed easily.  Psychiatric/Behavioral: Negative.     Objective:  BP (!) 180/86 (BP Location: Left Arm, Patient Position: Sitting, Cuff Size: Large)   Pulse 98   Temp 98.6 F (37 C) (Oral)   Resp 16   Ht 5\' 6"  (1.676 m)   Wt 172 lb (78 kg)   SpO2  99%   BMI 27.76 kg/m   BP Readings from Last 3 Encounters:  04/26/21 (!) 180/86  04/20/21 (!) 180/80  02/25/20 (!) 146/94    Wt Readings from Last 3 Encounters:  04/26/21 172 lb (78 kg)  04/20/21 173 lb 9.6 oz (78.7 kg)  02/25/20 177 lb (80.3 kg)    Physical Exam Vitals reviewed.  HENT:     Nose: Nose normal.     Mouth/Throat:     Mouth: Mucous membranes are moist.  Eyes:     General: No scleral icterus.    Conjunctiva/sclera: Conjunctivae normal.  Cardiovascular:     Rate and Rhythm: Normal rate and regular rhythm.     Heart sounds: Normal heart sounds, S1 normal and S2 normal. No murmur heard.    Comments: EKG- SR with PAC's, 79 bpm No LVH or Q waves Pulmonary:     Breath sounds: No stridor. No wheezing, rhonchi or rales.  Abdominal:     General: Abdomen is flat.     Palpations: There is no mass.     Tenderness: There is no abdominal tenderness. There is no guarding.     Hernia: No hernia is present.  Musculoskeletal:     Cervical back: Neck supple.     Right lower leg: No edema.     Left lower leg: No edema.  Lymphadenopathy:     Cervical: No cervical adenopathy.  Skin:    General: Skin is warm and dry.  Neurological:     General: No focal deficit present.  Psychiatric:        Mood and  Affect: Mood normal.        Behavior: Behavior normal.    Lab Results  Component Value Date   WBC 4.5 04/26/2021   HGB 12.2 04/26/2021   HCT 37.6 04/26/2021   PLT 247.0 04/26/2021   GLUCOSE 114 (H) 04/26/2021   CHOL 165 04/26/2021   TRIG 69.0 04/26/2021   HDL 52.60 04/26/2021   LDLDIRECT 137.1 11/17/2008   LDLCALC 99 04/26/2021   ALT 17 04/26/2021   AST 22 04/26/2021   NA 139 04/26/2021   K 4.6 04/26/2021   CL 100 04/26/2021   CREATININE 0.79 04/26/2021   BUN 10 04/26/2021   CO2 30 04/26/2021   TSH 2.04 04/26/2021   HGBA1C 6.1 04/26/2021    MM 3D SCREEN BREAST BILATERAL  Result Date: 09/28/2020 CLINICAL DATA:  Screening. EXAM: DIGITAL SCREENING BILATERAL  MAMMOGRAM WITH TOMOSYNTHESIS AND CAD TECHNIQUE: Bilateral screening digital craniocaudal and mediolateral oblique mammograms were obtained. Bilateral screening digital breast tomosynthesis was performed. The images were evaluated with computer-aided detection. COMPARISON:  Previous exam(s). ACR Breast Density Category c: The breast tissue is heterogeneously dense, which may obscure small masses. FINDINGS: There are no findings suspicious for malignancy. IMPRESSION: No mammographic evidence of malignancy. A result letter of this screening mammogram will be mailed directly to the patient. RECOMMENDATION: Screening mammogram in one year. (Code:SM-B-01Y) BI-RADS CATEGORY  1: Negative. Electronically Signed   By: Harmon Pier M.D.   On: 09/28/2020 10:45    Assessment & Plan:   Kiara Holt was seen today for annual exam and hypertension.  Diagnoses and all orders for this visit:  Essential hypertension, benign- I will check labs to screen for secondary causes and endorgan damage.  I recommended that she start taking the combination of triamterene/hydrochlorothiazide. -     CBC with Differential/Platelet; Future -     Basic metabolic panel; Future -     Aldosterone + renin activity w/ ratio; Future -     EKG 12-Lead -     Aldosterone + renin activity w/ ratio -     Basic metabolic panel -     CBC with Differential/Platelet -     Urinalysis, Routine w reflex microscopic; Future -     Urinalysis, Routine w reflex microscopic -     triamterene-hydrochlorothiazide (DYAZIDE) 37.5-25 MG capsule; Take 1 each (1 capsule total) by mouth daily.  PAC (premature atrial contraction)- She is asymptomatic with this.  The work-up for secondary causes is negative. -     TSH; Future -     TSH  Hyperlipidemia LDL goal <130- I recommended that she take a statin for cardiovascular risk reduction. -     Lipid panel; Future -     Hepatic function panel; Future -     Hepatic function panel -     Lipid panel -      rosuvastatin (CRESTOR) 5 MG tablet; Take 1 tablet (5 mg total) by mouth daily.  Prediabetes- Her A1c is 6.1%.  Medical therapy is not yet indicated. -     Hemoglobin A1c; Future -     TSH; Future -     TSH -     Hemoglobin A1c  Vitamin D deficiency -     VITAMIN D 25 Hydroxy (Vit-D Deficiency, Fractures); Future -     VITAMIN D 25 Hydroxy (Vit-D Deficiency, Fractures)  Routine general medical examination at a health care facility- Exam completed, labs reviewed, vaccines reviewed-she refused vaccines against pneumonia and influenza, cancer screenings are up-to-date, patient  education was given.  Need for shingles vaccine -     Discontinue: Zoster Vaccine Adjuvanted [facility]) injection; Inject 0.5 mLs into the muscle once for 1 dose.  Need for prophylactic vaccination with combined diphtheria-tetanus-pertussis (DTP) vaccine -     Tdap (BOOSTRIX) 5-2.5-18.5 LF-MCG/0.5 injection; Inject 0.5 mLs into the muscle once for 1 dose.  I have discontinued [facility]ie B. Blankenbaker's indapamide and Shingrix. I am also having her start on Boostrix, triamterene-hydrochlorothiazide, and rosuvastatin.  Meds ordered this encounter  Medications   DISCONTD: Zoster Vaccine Adjuvanted [facility] Iv) injection    Sig: Inject 0.5 mLs into the muscle once for 1 dose.    Dispense:  0.5 mL    Refill:  1   Tdap (BOOSTRIX) 5-2.5-18.5 LF-MCG/0.5 injection    Sig: Inject 0.5 mLs into the muscle once for 1 dose.    Dispense:  0.5 mL    Refill:  0   triamterene-hydrochlorothiazide (DYAZIDE) 37.5-25 MG capsule    Sig: Take 1 each (1 capsule total) by mouth daily.    Dispense:  90 capsule    Refill:  0   rosuvastatin (CRESTOR) 5 MG tablet    Sig: Take 1 tablet (5 mg total) by mouth daily.    Dispense:  90 tablet    Refill:  0      Follow-up: Return in about 4 weeks (around 05/24/2021).  Sanda Linger, MD

## 2021-05-06 LAB — ALDOSTERONE + RENIN ACTIVITY W/ RATIO
ALDO / PRA Ratio: 1.5 Ratio (ref 0.9–28.9)
Aldosterone: 3 ng/dL
Renin Activity: 1.94 ng/mL/h (ref 0.25–5.82)

## 2021-05-30 ENCOUNTER — Encounter: Payer: Self-pay | Admitting: Internal Medicine

## 2021-05-30 ENCOUNTER — Ambulatory Visit: Payer: Medicare Other | Admitting: Internal Medicine

## 2021-05-30 ENCOUNTER — Other Ambulatory Visit: Payer: Self-pay

## 2021-05-30 VITALS — BP 166/86 | HR 102 | Temp 98.6°F | Ht 66.0 in | Wt 168.0 lb

## 2021-05-30 DIAGNOSIS — I10 Essential (primary) hypertension: Secondary | ICD-10-CM | POA: Diagnosis not present

## 2021-05-30 DIAGNOSIS — I491 Atrial premature depolarization: Secondary | ICD-10-CM | POA: Diagnosis not present

## 2021-05-30 MED ORDER — NEBIVOLOL HCL 10 MG PO TABS
10.0000 mg | ORAL_TABLET | Freq: Every day | ORAL | 1 refills | Status: DC
Start: 2021-05-30 — End: 2021-08-30

## 2021-05-30 NOTE — Patient Instructions (Signed)

## 2021-05-30 NOTE — Progress Notes (Signed)
Subjective:  Patient ID: Kiara Holt, female    DOB: [birth year]  Age: 70 y.o. MRN: [id number]  CC: Hypertension  This visit occurred during the SARS-CoV-2 public health emergency.  Safety protocols were in place, including screening questions prior to the visit, additional usage of staff PPE, and extensive cleaning of exam room while observing appropriate contact time as indicated for disinfecting solutions.    HPI Kiara Holt presents for f/up -  She walks 2 miles a day and does not experience chest pain, shortness of breath, diaphoresis, dizziness, lightheadedness, or palpitations.  She tells me she is compliant with Dyazide.  Outpatient Medications Prior to Visit  Medication Sig Dispense Refill   rosuvastatin (CRESTOR) 5 MG tablet Take 1 tablet (5 mg total) by mouth daily. 90 tablet 0   triamterene-hydrochlorothiazide (DYAZIDE) 37.5-25 MG capsule Take 1 each (1 capsule total) by mouth daily. 90 capsule 0   No facility-administered medications prior to visit.    ROS Review of Systems  Constitutional:  Negative for diaphoresis and fatigue.  HENT: Negative.    Eyes:  Negative for visual disturbance.  Respiratory:  Negative for cough, chest tightness, shortness of breath and wheezing.   Cardiovascular:  Negative for chest pain, palpitations and leg swelling.  Gastrointestinal:  Negative for abdominal pain, diarrhea, nausea and vomiting.  Endocrine: Negative.   Genitourinary: Negative.   Musculoskeletal: Negative.   Skin: Negative.   Neurological:  Negative for dizziness, weakness, light-headedness and headaches.  Hematological:  Negative for adenopathy. Does not bruise/bleed easily.  Psychiatric/Behavioral: Negative.     Objective:  BP (!) 166/86 (BP Location: Left Arm, Patient Position: Sitting, Cuff Size: Normal)   Pulse (!) 102   Temp 98.6 F (37 C) (Oral)   Ht 5\' 6"  (1.676 m)   Wt 168 lb (76.2 kg)   SpO2 96%   BMI 27.12 kg/m   BP Readings from Last 3  Encounters:  05/30/21 (!) 166/86  04/26/21 (!) 180/86  04/20/21 (!) 180/80    Wt Readings from Last 3 Encounters:  05/30/21 168 lb (76.2 kg)  04/26/21 172 lb (78 kg)  04/20/21 173 lb 9.6 oz (78.7 kg)    Physical Exam Vitals reviewed.  HENT:     Nose: Nose normal.     Mouth/Throat:     Mouth: Mucous membranes are moist.  Eyes:     Conjunctiva/sclera: Conjunctivae normal.  Cardiovascular:     Rate and Rhythm: Tachycardia present. FrequentExtrasystoles are present.    Heart sounds: No murmur heard. Pulmonary:     Effort: Pulmonary effort is normal.     Breath sounds: No stridor. No wheezing, rhonchi or rales.  Abdominal:     General: Abdomen is flat.     Tenderness: There is no abdominal tenderness. There is no guarding or rebound.     Hernia: No hernia is present.  Musculoskeletal:        General: Normal range of motion.     Cervical back: Neck supple.  Skin:    General: Skin is warm.  Neurological:     General: No focal deficit present.     Mental Status: She is alert.  Psychiatric:        Mood and Affect: Mood normal.    Lab Results  Component Value Date   WBC 4.5 04/26/2021   HGB 12.2 04/26/2021   HCT 37.6 04/26/2021   PLT 247.0 04/26/2021   GLUCOSE 114 (H) 04/26/2021   CHOL 165 04/26/2021   TRIG 69.0  04/26/2021   HDL 52.60 04/26/2021   LDLDIRECT 137.1 11/17/2008   LDLCALC 99 04/26/2021   ALT 17 04/26/2021   AST 22 04/26/2021   NA 139 04/26/2021   K 4.6 04/26/2021   CL 100 04/26/2021   CREATININE 0.79 04/26/2021   BUN 10 04/26/2021   CO2 30 04/26/2021   TSH 2.04 04/26/2021   HGBA1C 6.1 04/26/2021    MM 3D SCREEN BREAST BILATERAL  Result Date: 09/28/2020 CLINICAL DATA:  Screening. EXAM: DIGITAL SCREENING BILATERAL MAMMOGRAM WITH TOMOSYNTHESIS AND CAD TECHNIQUE: Bilateral screening digital craniocaudal and mediolateral oblique mammograms were obtained. Bilateral screening digital breast tomosynthesis was performed. The images were evaluated with  computer-aided detection. COMPARISON:  Previous exam(s). ACR Breast Density Category c: The breast tissue is heterogeneously dense, which may obscure small masses. FINDINGS: There are no findings suspicious for malignancy. IMPRESSION: No mammographic evidence of malignancy. A result letter of this screening mammogram will be mailed directly to the patient. RECOMMENDATION: Screening mammogram in one year. (Code:SM-B-01Y) BI-RADS CATEGORY  1: Negative. Electronically Signed   By: Harmon Pier M.D.   On: 09/28/2020 10:45    Assessment & Plan:   Kiara Holt was seen today for hypertension.  Diagnoses and all orders for this visit:  PAC (premature atrial contraction)- Will start nebivolol. -     nebivolol (BYSTOLIC) 10 MG tablet; Take 1 tablet (10 mg total) by mouth daily.  Essential hypertension, benign- Her blood pressure has improved but is still not adequately well controlled.  Will add nebivolol to Dyazide. -     nebivolol (BYSTOLIC) 10 MG tablet; Take 1 tablet (10 mg total) by mouth daily.  I am having Kiara Holt start on nebivolol. I am also having her maintain her triamterene-hydrochlorothiazide and rosuvastatin.  Meds ordered this encounter  Medications   nebivolol (BYSTOLIC) 10 MG tablet    Sig: Take 1 tablet (10 mg total) by mouth daily.    Dispense:  90 tablet    Refill:  1     Follow-up: Return in about 3 months (around 08/30/2021).  Sanda Linger, MD

## 2021-06-06 ENCOUNTER — Ambulatory Visit: Payer: Medicare Other | Admitting: Internal Medicine

## 2021-06-19 ENCOUNTER — Telehealth: Payer: Self-pay | Admitting: Internal Medicine

## 2021-06-19 NOTE — Telephone Encounter (Signed)
Patient states she is having sharp pains in her side due to rx nebivolol (BYSTOLIC) 10 MG tablet   Patient states she will discontinue taking the medication and is requesting a call back for advice

## 2021-06-20 NOTE — Telephone Encounter (Signed)
Called pt, LVM to discuss.  

## 2021-07-21 ENCOUNTER — Other Ambulatory Visit: Payer: Self-pay | Admitting: Internal Medicine

## 2021-07-21 DIAGNOSIS — I10 Essential (primary) hypertension: Secondary | ICD-10-CM

## 2021-08-23 ENCOUNTER — Other Ambulatory Visit: Payer: Self-pay | Admitting: Internal Medicine

## 2021-08-23 DIAGNOSIS — Z1231 Encounter for screening mammogram for malignant neoplasm of breast: Secondary | ICD-10-CM

## 2021-08-30 ENCOUNTER — Other Ambulatory Visit: Payer: Self-pay

## 2021-08-30 ENCOUNTER — Ambulatory Visit: Payer: Medicare Other | Admitting: Internal Medicine

## 2021-08-30 ENCOUNTER — Encounter: Payer: Self-pay | Admitting: Internal Medicine

## 2021-08-30 DIAGNOSIS — E785 Hyperlipidemia, unspecified: Secondary | ICD-10-CM | POA: Diagnosis not present

## 2021-08-30 DIAGNOSIS — I10 Essential (primary) hypertension: Secondary | ICD-10-CM

## 2021-08-30 LAB — BASIC METABOLIC PANEL
BUN: 9 mg/dL (ref 6–23)
CO2: 32 mEq/L (ref 19–32)
Calcium: 10.2 mg/dL (ref 8.4–10.5)
Chloride: 96 mEq/L (ref 96–112)
Creatinine, Ser: 0.87 mg/dL (ref 0.40–1.20)
GFR: 68.37 mL/min (ref >60.00)
Glucose, Bld: 128 mg/dL — ABNORMAL HIGH (ref 70–99)
Potassium: 3.8 mEq/L (ref 3.5–5.1)
Sodium: 136 mEq/L (ref 135–145)

## 2021-08-30 MED ORDER — AMLODIPINE BESYLATE 5 MG PO TABS: 5.0000 mg | ORAL_TABLET | Freq: Every day | ORAL | 0 refills | Status: DC

## 2021-08-30 MED ORDER — TRIAMTERENE-HCTZ 37.5-25 MG PO CAPS: 1.0000 | ORAL_CAPSULE | Freq: Every day | ORAL | 0 refills | Status: DC

## 2021-08-30 MED ORDER — ROSUVASTATIN CALCIUM 5 MG PO TABS: 5.0000 mg | ORAL_TABLET | Freq: Every day | ORAL | 0 refills | Status: DC

## 2021-08-30 NOTE — Patient Instructions (Signed)

## 2021-08-30 NOTE — Progress Notes (Signed)
Subjective:  Patient ID: Kiara Holt, female    DOB: 09-02-1950  Age: 71 y.o. MRN: [id number]  CC: Hypertension  This visit occurred during the SARS-CoV-2 public health emergency.  Safety protocols were in place, including screening questions prior to the visit, additional usage of staff PPE, and extensive cleaning of exam room while observing appropriate contact time as indicated for disinfecting solutions.    HPI [name] presents for f/up -  She is not taking nebivolol because it caused drowsiness.  She walks about 2 miles a day and does not experience chest pain, shortness of breath, diaphoresis, dizziness, or lightheadedness.  She is not willing to get any vaccines today.  Outpatient Medications Prior to Visit  Medication Sig Dispense Refill   nebivolol (BYSTOLIC) 10 MG tablet Take 1 tablet (10 mg total) by mouth daily. 90 tablet 1   rosuvastatin (CRESTOR) 5 MG tablet Take 1 tablet (5 mg total) by mouth daily. 90 tablet 0   triamterene-hydrochlorothiazide (DYAZIDE) 37.5-25 MG capsule TAKE 1 CAPSULE BY MOUTH ONCE DAILY 90 capsule 0   No facility-administered medications prior to visit.    ROS Review of Systems  Constitutional:  Negative for diaphoresis and fatigue.  HENT: Negative.    Eyes:  Negative for visual disturbance.  Respiratory:  Negative for cough, chest tightness, shortness of breath and wheezing.   Cardiovascular:  Negative for chest pain, palpitations and leg swelling.  Gastrointestinal:  Negative for abdominal pain, diarrhea, nausea and vomiting.  Endocrine: Negative.   Genitourinary: Negative.  Negative for difficulty urinating and hematuria.  Musculoskeletal: Negative.  Negative for myalgias.  Skin: Negative.   Neurological:  Negative for dizziness, weakness, light-headedness and headaches.  Hematological:  Negative for adenopathy. Does not bruise/bleed easily.  Psychiatric/Behavioral: Negative.     Objective:  BP (!) 174/76 (BP Location: Left  Arm, Patient Position: Sitting, Cuff Size: Large)    Pulse 78    Temp 98.3 F (36.8 C) (Oral)    Resp 16    Ht 5\' 6"  (1.676 m)    Wt 172 lb (78 kg)    SpO2 97%    BMI 27.76 kg/m   BP Readings from Last 3 Encounters:  08/30/21 (!) 174/76  05/30/21 (!) 166/86  04/26/21 (!) 180/86    Wt Readings from Last 3 Encounters:  08/30/21 172 lb (78 kg)  05/30/21 168 lb (76.2 kg)  04/26/21 172 lb (78 kg)    Physical Exam Vitals reviewed.  HENT:     Nose: Nose normal.     Mouth/Throat:     Mouth: Mucous membranes are moist.  Eyes:     General: No scleral icterus.    Conjunctiva/sclera: Conjunctivae normal.  Cardiovascular:     Rate and Rhythm: Normal rate and regular rhythm.     Heart sounds: No murmur heard. Pulmonary:     Effort: Pulmonary effort is normal.     Breath sounds: No stridor. No wheezing, rhonchi or rales.  Abdominal:     General: Abdomen is flat.     Palpations: There is no mass.     Tenderness: There is no abdominal tenderness. There is no guarding.     Hernia: No hernia is present.  Musculoskeletal:     Cervical back: Neck supple.  Lymphadenopathy:     Cervical: No cervical adenopathy.  Skin:    General: Skin is warm and dry.     Findings: No lesion.  Neurological:     General: No focal deficit present.  Mental Status: She is alert.  Psychiatric:        Mood and Affect: Mood normal.        Behavior: Behavior normal.    Lab Results  Component Value Date   WBC 4.5 04/26/2021   HGB 12.2 04/26/2021   HCT 37.6 04/26/2021   PLT 247.0 04/26/2021   GLUCOSE 128 (H) 08/30/2021   CHOL 165 04/26/2021   TRIG 69.0 04/26/2021   HDL 52.60 04/26/2021   LDLDIRECT 137.1 11/17/2008   LDLCALC 99 04/26/2021   ALT 17 04/26/2021   AST 22 04/26/2021   NA 136 08/30/2021   K 3.8 08/30/2021   CL 96 08/30/2021   CREATININE 0.87 08/30/2021   BUN 9 08/30/2021   CO2 32 08/30/2021   TSH 2.04 04/26/2021   HGBA1C 6.1 04/26/2021    MM 3D SCREEN BREAST BILATERAL  Result  Date: 09/28/2020 CLINICAL DATA:  Screening. EXAM: DIGITAL SCREENING BILATERAL MAMMOGRAM WITH TOMOSYNTHESIS AND CAD TECHNIQUE: Bilateral screening digital craniocaudal and mediolateral oblique mammograms were obtained. Bilateral screening digital breast tomosynthesis was performed. The images were evaluated with computer-aided detection. COMPARISON:  Previous exam(s). ACR Breast Density Category c: The breast tissue is heterogeneously dense, which may obscure small masses. FINDINGS: There are no findings suspicious for malignancy. IMPRESSION: No mammographic evidence of malignancy. A result letter of this screening mammogram will be mailed directly to the patient. RECOMMENDATION: Screening mammogram in one year. (Code:SM-B-01Y) BI-RADS CATEGORY  1: Negative. Electronically Signed   By: Margarette Canada M.D.   On: 09/28/2020 10:45    Assessment & Plan:   Skylah was seen today for hypertension.  Diagnoses and all orders for this visit:  Hyperlipidemia LDL goal <130 -     rosuvastatin (CRESTOR) 5 MG tablet; Take 1 tablet (5 mg total) by mouth daily.  Essential hypertension, benign- Her blood pressure is not adequately well controlled.  I have asked her to be more compliant with Dyazide.  Will also add amlodipine. -     triamterene-hydrochlorothiazide (DYAZIDE) 37.5-25 MG capsule; Take 1 each (1 capsule total) by mouth daily. -     Basic metabolic panel; Future -     amLODipine (NORVASC) 5 MG tablet; Take 1 tablet (5 mg total) by mouth daily. -     Basic metabolic panel   I have discontinued Shekinah B. Faria's nebivolol. I have also changed her triamterene-hydrochlorothiazide. Additionally, I am having her start on amLODipine. Lastly, I am having her maintain her rosuvastatin.  Meds ordered this encounter  Medications   rosuvastatin (CRESTOR) 5 MG tablet    Sig: Take 1 tablet (5 mg total) by mouth daily.    Dispense:  90 tablet    Refill:  0   triamterene-hydrochlorothiazide (DYAZIDE) 37.5-25 MG  capsule    Sig: Take 1 each (1 capsule total) by mouth daily.    Dispense:  90 capsule    Refill:  0   amLODipine (NORVASC) 5 MG tablet    Sig: Take 1 tablet (5 mg total) by mouth daily.    Dispense:  90 tablet    Refill:  0     Follow-up: Return in about 3 months (around 11/28/2021).  Scarlette Calico, MD

## 2021-09-25 ENCOUNTER — Other Ambulatory Visit: Payer: Self-pay

## 2021-09-25 ENCOUNTER — Ambulatory Visit
Admission: RE | Admit: 2021-09-25 | Discharge: 2021-09-25 | Disposition: A | Discharge status: Home / Self Care | Payer: Medicare Other | Source: Ambulatory Visit | Attending: Internal Medicine | Admitting: Internal Medicine

## 2021-09-25 DIAGNOSIS — Z1231 Encounter for screening mammogram for malignant neoplasm of breast: Secondary | ICD-10-CM

## 2021-09-26 ENCOUNTER — Other Ambulatory Visit: Payer: Self-pay | Admitting: Internal Medicine

## 2021-09-26 DIAGNOSIS — R928 Other abnormal and inconclusive findings on diagnostic imaging of breast: Secondary | ICD-10-CM

## 2021-10-17 ENCOUNTER — Other Ambulatory Visit: Payer: Self-pay | Admitting: Internal Medicine

## 2021-10-17 DIAGNOSIS — I10 Essential (primary) hypertension: Secondary | ICD-10-CM

## 2021-10-18 ENCOUNTER — Ambulatory Visit: Payer: Medicare Other

## 2021-10-18 ENCOUNTER — Ambulatory Visit
Admission: RE | Admit: 2021-10-18 | Discharge: 2021-10-18 | Disposition: A | Discharge status: Home / Self Care | Payer: Medicare Other | Source: Ambulatory Visit | Attending: Internal Medicine | Admitting: Internal Medicine

## 2021-10-18 DIAGNOSIS — R928 Other abnormal and inconclusive findings on diagnostic imaging of breast: Secondary | ICD-10-CM

## 2021-11-02 ENCOUNTER — Telehealth: Payer: Self-pay

## 2021-11-02 NOTE — Telephone Encounter (Signed)
? ?  Hypertension Outreach Note  ?Pharmacy Note ? ?Spoke with patient - confirms adherence to current BP medications, denies issues  ? ?Declines statin medication - is not currently taking  ? ?Hypertension (BP goal <140/90) ?-Controlled - recent home BP since medication changes and adherence to medication has brought BP into range  ?-Current treatment: ?Triamterene-hctz 37.5-25mg  daily  ?Amlodipine 5mg  daily  ?-Medications previously tried: chlorthalidone, indapamide  ?-Current home readings: 138/90, 137/83, 138/83, 135/83, 133/84 ?-Current dietary habits: reports to trying to eat a low sodium diet ?-Current exercise habits: goes to the gym 3 days out of the week, typically walking 2 miles daily  ?-Denies hypotensive/hypertensive symptoms ?-Educated on BP goals and benefits of medications for prevention of heart attack, stroke and kidney damage; ?Daily salt intake goal < 2300 mg; ?Exercise goal of 150 minutes per week; ?Importance of home blood pressure monitoring; ?Proper BP monitoring technique; ?-Counseled to monitor BP at home 3 times weekly, document, and provide log at future appointments ?-Recommended to continue current medication ? ? ?Tomasa Blase, PharmD ?Clinical Pharmacist, [facility]  ?

## 2021-11-02 NOTE — Telephone Encounter (Signed)
? ?  Hypertension Outreach Note  ?Pharmacy Note ? ? ?Left message for patient, reaching out to discuss blood pressures since most recent medication change, patient to call back to further discuss  ? ?Ellin Saba, PharmD ?Clinical Pharmacist, [facility]  ? ?

## 2021-11-25 ENCOUNTER — Other Ambulatory Visit: Payer: Self-pay | Admitting: Internal Medicine

## 2021-11-25 DIAGNOSIS — I10 Essential (primary) hypertension: Secondary | ICD-10-CM

## 2021-11-28 ENCOUNTER — Ambulatory Visit: Payer: Medicare Other | Admitting: Internal Medicine

## 2021-11-28 ENCOUNTER — Encounter: Payer: Self-pay | Admitting: Internal Medicine

## 2021-11-28 VITALS — BP 152/90 | HR 80 | Temp 98.1°F | Resp 16 | Ht 66.0 in | Wt 170.0 lb

## 2021-11-28 DIAGNOSIS — R7303 Prediabetes: Secondary | ICD-10-CM

## 2021-11-28 DIAGNOSIS — I10 Essential (primary) hypertension: Secondary | ICD-10-CM

## 2021-11-28 LAB — CBC WITH DIFFERENTIAL/PLATELET
Basophils Absolute: 0 10*3/uL (ref 0.0–0.1)
Basophils Relative: 0.3 % (ref 0.0–3.0)
Eosinophils Absolute: 0 10*3/uL (ref 0.0–0.7)
Eosinophils Relative: 0.8 % (ref 0.0–5.0)
HCT: 39.8 % (ref 36.0–46.0)
Hemoglobin: 12.9 g/dL (ref 12.0–15.0)
Lymphocytes Relative: 27.8 % (ref 12.0–46.0)
Lymphs Abs: 1.3 10*3/uL (ref 0.7–4.0)
MCHC: 32.4 g/dL (ref 30.0–36.0)
MCV: 84.8 fl (ref 78.0–100.0)
Monocytes Absolute: 0.7 10*3/uL (ref 0.1–1.0)
Monocytes Relative: 15 % — ABNORMAL HIGH (ref 3.0–12.0)
Neutro Abs: 2.7 10*3/uL (ref 1.4–7.7)
Neutrophils Relative %: 56.1 % (ref 43.0–77.0)
Platelets: 214 10*3/uL (ref 150.0–400.0)
RBC: 4.69 Mil/uL (ref 3.87–5.11)
RDW: 14.4 % (ref 11.5–15.5)
WBC: 4.7 10*3/uL (ref 4.0–10.5)

## 2021-11-28 LAB — BASIC METABOLIC PANEL
BUN: 10 mg/dL (ref 6–23)
CO2: 30 mEq/L (ref 19–32)
Calcium: 9.9 mg/dL (ref 8.4–10.5)
Chloride: 96 mEq/L (ref 96–112)
Creatinine, Ser: 0.75 mg/dL (ref 0.40–1.20)
GFR: 81.56 mL/min (ref >60.00)
Glucose, Bld: 129 mg/dL — ABNORMAL HIGH (ref 70–99)
Potassium: 3.5 mEq/L (ref 3.5–5.1)
Sodium: 135 mEq/L (ref 135–145)

## 2021-11-28 LAB — HEMOGLOBIN A1C: Hgb A1c MFr Bld: 6.2 % (ref 4.6–6.5)

## 2021-11-28 NOTE — Progress Notes (Signed)
? ?Subjective:  ?Patient ID: Kiara Holt, female    DOB: 30-May-1951  Age: 71 y.o. MRN: [id number] ? ?CC: Hypertension ? ? ?HPI ?Kiara Holt presents for f/up -  ? ?She exercises, walks about 3 times per per week.  She can go for 2 miles without experiencing chest pain shortness of breath, diaphoresis, dizziness, lightheadedness, or edema.  She tells me she is compliant with both antihypertensives. ? ?Outpatient Medications Prior to Visit  ?Medication Sig Dispense Refill  ? amLODipine (NORVASC) 5 MG tablet TAKE 1 TABLET(5 MG) BY MOUTH DAILY 90 tablet 0  ? triamterene-hydrochlorothiazide (DYAZIDE) 37.5-25 MG capsule TAKE 1 CAPSULE BY MOUTH EVERY DAY 90 capsule 0  ? rosuvastatin (CRESTOR) 5 MG tablet Take 1 tablet (5 mg total) by mouth daily. 90 tablet 0  ? ?No facility-administered medications prior to visit.  ? ? ?ROS ?Review of Systems  ?Constitutional: Negative.  Negative for diaphoresis and fatigue.  ?HENT: Negative.    ?Eyes: Negative.   ?Respiratory: Negative.  Negative for cough, chest tightness, shortness of breath and wheezing.   ?Cardiovascular:  Negative for chest pain, palpitations and leg swelling.  ?Gastrointestinal:  Negative for abdominal pain, constipation, diarrhea, nausea and vomiting.  ?Endocrine: Negative.   ?Genitourinary: Negative.   ?Musculoskeletal: Negative.  Negative for arthralgias and myalgias.  ?Skin: Negative.   ?Neurological: Negative.  Negative for dizziness, weakness and light-headedness.  ?Hematological:  Negative for adenopathy. Does not bruise/bleed easily.  ? ?Objective:  ?BP (!) 152/90 (BP Location: Right Arm, Patient Position: Sitting, Cuff Size: Large)   Pulse 80   Temp 98.1 ?F (36.7 ?C) (Oral)   Resp 16   Ht 5\' 6"  (1.676 m)   Wt 170 lb (77.1 kg)   SpO2 96%   BMI 27.44 kg/m?  ? ?BP Readings from Last 3 Encounters:  ?11/28/21 (!) 152/90  ?08/30/21 (!) 174/76  ?05/30/21 (!) 166/86  ? ? ?Wt Readings from Last 3 Encounters:  ?11/28/21 170 lb (77.1 kg)  ?08/30/21 172  lb (78 kg)  ?05/30/21 168 lb (76.2 kg)  ? ? ?Physical Exam ?Vitals reviewed.  ?HENT:  ?   Nose: Nose normal.  ?   Mouth/Throat:  ?   Mouth: Mucous membranes are moist.  ?Eyes:  ?   General: No scleral icterus. ?   Conjunctiva/sclera: Conjunctivae normal.  ?Cardiovascular:  ?   Rate and Rhythm: Normal rate and regular rhythm.  ?   Heart sounds: No murmur heard. ?Pulmonary:  ?   Effort: Pulmonary effort is normal.  ?   Breath sounds: No stridor. No wheezing, rhonchi or rales.  ?Abdominal:  ?   General: Abdomen is flat.  ?   Palpations: There is no mass.  ?   Tenderness: There is no abdominal tenderness. There is no guarding.  ?   Hernia: No hernia is present.  ?Musculoskeletal:     ?   General: Normal range of motion.  ?   Cervical back: Neck supple.  ?   Right lower leg: No edema.  ?   Left lower leg: No edema.  ?Lymphadenopathy:  ?   Cervical: No cervical adenopathy.  ?Skin: ?   General: Skin is warm and dry.  ?Neurological:  ?   General: No focal deficit present.  ?   Mental Status: She is alert.  ?Psychiatric:     ?   Mood and Affect: Mood normal.     ?   Behavior: Behavior normal.  ? ? ?Lab Results  ?Component Value  Date  ? WBC 4.7 11/28/2021  ? HGB 12.9 11/28/2021  ? HCT 39.8 11/28/2021  ? PLT 214.0 11/28/2021  ? GLUCOSE 129 (H) 11/28/2021  ? CHOL 165 04/26/2021  ? TRIG 69.0 04/26/2021  ? HDL 52.60 04/26/2021  ? LDLDIRECT 137.1 11/17/2008  ? LDLCALC 99 04/26/2021  ? ALT 17 04/26/2021  ? AST 22 04/26/2021  ? NA 135 11/28/2021  ? K 3.5 11/28/2021  ? CL 96 11/28/2021  ? CREATININE 0.75 11/28/2021  ? BUN 10 11/28/2021  ? CO2 30 11/28/2021  ? TSH 2.04 04/26/2021  ? HGBA1C 6.2 11/28/2021  ? ? ?MM DIAG BREAST TOMO UNI RIGHT ? ?Result Date: 10/18/2021 ?CLINICAL DATA:  Recall from screening mammography, possible asymmetry in the central RIGHT breast visible only on the CC view. EXAM: DIGITAL DIAGNOSTIC UNILATERAL RIGHT MAMMOGRAM WITH TOMOSYNTHESIS AND CAD TECHNIQUE: Right digital diagnostic mammography and breast  tomosynthesis was performed. The images were evaluated with computer-aided detection. COMPARISON:  Previous exam(s). ACR Breast Density Category c: The breast tissue is heterogeneously dense, which may obscure small masses. FINDINGS: Spot-compression CC view of the area of concern and full field mediolateral and medially rolled CC views were obtained. The asymmetry in the central breast questioned on screening mammography partially disperses with compression, indicating overlapping fibroglandular tissue and Cooper's ligaments. There is no underlying mass or architectural distortion. This is confirmed on the medially rolled CC view and the full field mediolateral view. No findings suspicious for malignancy. IMPRESSION: No mammographic evidence of malignancy involving the RIGHT breast. RECOMMENDATION: Screening mammogram in one year.(Code:SM-B-01Y) I have discussed the findings and recommendations with the patient. If applicable, a reminder letter will be sent to the patient regarding the next appointment. BI-RADS CATEGORY  1: Negative. Electronically Signed   By: Hulan Saas M.D.   On: 10/18/2021 10:42 ? ? ?Assessment & Plan:  ? ?[name] was seen today for hypertension. ? ?Diagnoses and all orders for this visit: ? ?Essential hypertension, benign- Her blood pressure is adequately well controlled.  Electrolytes and renal function are normal.  Will continue to current antihypertensives. ?-     Basic metabolic panel; Future ?-     CBC with Differential/Platelet; Future ?-     CBC with Differential/Platelet ?-     Basic metabolic panel ? ?Prediabetes- Her A1c is at 6.2%.  Medical therapy is not indicated. ?-     Basic metabolic panel; Future ?-     Hemoglobin A1c; Future ?-     Hemoglobin A1c ?-     Basic metabolic panel ? ? ?I have discontinued [name]'s rosuvastatin. I am also having her maintain her triamterene-hydrochlorothiazide and amLODipine. ? ?No orders of the defined types were placed in this  encounter. ? ? ? ?Follow-up: Return in about 6 months (around 05/30/2022). ? ?Sanda Linger, MD ?

## 2021-11-28 NOTE — Patient Instructions (Signed)
Hypertension, Adult High blood pressure (hypertension) is when the force of blood pumping through the arteries is too strong. The arteries are the blood vessels that carry blood from the heart throughout the body. Hypertension forces the heart to work harder to pump blood and may cause arteries to become narrow or stiff. Untreated or uncontrolled hypertension can lead to a heart attack, heart failure, a stroke, kidney disease, and other problems. A blood pressure reading consists of a higher number over a lower number. Ideally, your blood pressure should be below 120/80. The first ("top") number is called the systolic pressure. It is a measure of the pressure in your arteries as your heart beats. The second ("bottom") number is called the diastolic pressure. It is a measure of the pressure in your arteries as the heart relaxes. What are the causes? The exact cause of this condition is not known. There are some conditions that result in high blood pressure. What increases the risk? Certain factors may make you more likely to develop high blood pressure. Some of these risk factors are under your control, including: Smoking. Not getting enough exercise or physical activity. Being overweight. Having too much fat, sugar, calories, or salt (sodium) in your diet. Drinking too much alcohol. Other risk factors include: Having a personal history of heart disease, diabetes, high cholesterol, or kidney disease. Stress. Having a family history of high blood pressure and high cholesterol. Having obstructive sleep apnea. Age. The risk increases with age. What are the signs or symptoms? High blood pressure may not cause symptoms. Very high blood pressure (hypertensive crisis) may cause: Headache. Fast or irregular heartbeats (palpitations). Shortness of breath. Nosebleed. Nausea and vomiting. Vision changes. Severe chest pain, dizziness, and seizures. How is this diagnosed? This condition is diagnosed by  measuring your blood pressure while you are seated, with your arm resting on a flat surface, your legs uncrossed, and your feet flat on the floor. The cuff of the blood pressure monitor will be placed directly against the skin of your upper arm at the level of your heart. Blood pressure should be measured at least twice using the same arm. Certain conditions can cause a difference in blood pressure between your right and left arms. If you have a high blood pressure reading during one visit or you have normal blood pressure with other risk factors, you may be asked to: Return on a different day to have your blood pressure checked again. Monitor your blood pressure at home for 1 week or longer. If you are diagnosed with hypertension, you may have other blood or imaging tests to help your health care provider understand your overall risk for other conditions. How is this treated? This condition is treated by making healthy lifestyle changes, such as eating healthy foods, exercising more, and reducing your alcohol intake. You may be referred for counseling on a healthy diet and physical activity. Your health care provider may prescribe medicine if lifestyle changes are not enough to get your blood pressure under control and if: Your systolic blood pressure is above 130. Your diastolic blood pressure is above 80. Your personal target blood pressure may vary depending on your medical conditions, your age, and other factors. Follow these instructions at home: Eating and drinking  Eat a diet that is high in fiber and potassium, and low in sodium, added sugar, and fat. An example of this eating plan is called the DASH diet. DASH stands for Dietary Approaches to Stop Hypertension. To eat this way: Eat   plenty of fresh fruits and vegetables. Try to fill one half of your plate at each meal with fruits and vegetables. Eat whole grains, such as whole-wheat pasta, brown rice, or whole-grain bread. Fill about one  fourth of your plate with whole grains. Eat or drink low-fat dairy products, such as skim milk or low-fat yogurt. Avoid fatty cuts of meat, processed or cured meats, and poultry with skin. Fill about one fourth of your plate with lean proteins, such as fish, chicken without skin, beans, eggs, or tofu. Avoid pre-made and processed foods. These tend to be higher in sodium, added sugar, and fat. Reduce your daily sodium intake. Many people with hypertension should eat less than 1,500 mg of sodium a day. Do not drink alcohol if: Your health care provider tells you not to drink. You are pregnant, may be pregnant, or are planning to become pregnant. If you drink alcohol: Limit how much you have to: 0-1 drink a day for women. 0-2 drinks a day for men. Know how much alcohol is in your drink. In the [place] one drink equals one 12 oz bottle of beer (355 mL), one 5 oz glass of wine (148 mL), or one 1 oz glass of hard liquor (44 mL). Lifestyle  Work with your health care provider to maintain a healthy body weight or to lose weight. Ask what an ideal weight is for you. Get at least 30 minutes of exercise that causes your heart to beat faster (aerobic exercise) most days of the week. Activities may include walking, swimming, or biking. Include exercise to strengthen your muscles (resistance exercise), such as Pilates or lifting weights, as part of your weekly exercise routine. Try to do these types of exercises for 30 minutes at least 3 days a week. Do not use any products that contain nicotine or tobacco. These products include cigarettes, chewing tobacco, and vaping devices, such as e-cigarettes. If you need help quitting, ask your health care provider. Monitor your blood pressure at home as told by your health care provider. Keep all follow-up visits. This is important. Medicines Take over-the-counter and prescription medicines only as told by your health care provider. Follow directions carefully. Blood  pressure medicines must be taken as prescribed. Do not skip doses of blood pressure medicine. Doing this puts you at risk for problems and can make the medicine less effective. Ask your health care provider about side effects or reactions to medicines that you should watch for. Contact a health care provider if you: Think you are having a reaction to a medicine you are taking. Have headaches that keep coming back (recurring). Feel dizzy. Have swelling in your ankles. Have trouble with your vision. Get help right away if you: Develop a severe headache or confusion. Have unusual weakness or numbness. Feel faint. Have severe pain in your chest or abdomen. Vomit repeatedly. Have trouble breathing. These symptoms may be an emergency. Get help right away. Call 911. Do not wait to see if the symptoms will go away. Do not drive yourself to the hospital. Summary Hypertension is when the force of blood pumping through your arteries is too strong. If this condition is not controlled, it may put you at risk for serious complications. Your personal target blood pressure may vary depending on your medical conditions, your age, and other factors. For most people, a normal blood pressure is less than 120/80. Hypertension is treated with lifestyle changes, medicines, or a combination of both. Lifestyle changes include losing weight, eating a healthy,   low-sodium diet, exercising more, and limiting alcohol. This information is not intended to replace advice given to you by your health care provider. Make sure you discuss any questions you have with your health care provider. Document Revised: 05/30/2021 Document Reviewed: 05/30/2021 [employer] Patient Education  2023 [employer]  

## 2022-01-13 ENCOUNTER — Other Ambulatory Visit: Payer: Self-pay | Admitting: Internal Medicine

## 2022-01-13 DIAGNOSIS — I10 Essential (primary) hypertension: Secondary | ICD-10-CM

## 2022-02-02 ENCOUNTER — Other Ambulatory Visit: Payer: Self-pay | Admitting: Internal Medicine

## 2022-02-02 DIAGNOSIS — I10 Essential (primary) hypertension: Secondary | ICD-10-CM

## 2022-04-11 ENCOUNTER — Other Ambulatory Visit: Payer: Self-pay | Admitting: Internal Medicine

## 2022-04-11 DIAGNOSIS — I10 Essential (primary) hypertension: Secondary | ICD-10-CM

## 2022-04-24 ENCOUNTER — Other Ambulatory Visit: Payer: Self-pay | Admitting: Internal Medicine

## 2022-04-24 DIAGNOSIS — I10 Essential (primary) hypertension: Secondary | ICD-10-CM

## 2022-04-27 ENCOUNTER — Ambulatory Visit (INDEPENDENT_AMBULATORY_CARE_PROVIDER_SITE_OTHER): Payer: Medicare Other

## 2022-04-27 VITALS — Ht 65.0 in | Wt 170.0 lb

## 2022-04-27 DIAGNOSIS — Z780 Asymptomatic menopausal state: Secondary | ICD-10-CM | POA: Diagnosis not present

## 2022-04-27 DIAGNOSIS — Z0000 Encounter for general adult medical examination without abnormal findings: Secondary | ICD-10-CM

## 2022-04-27 NOTE — Progress Notes (Signed)
Subjective:   Kiara Holt is a 71 y.o. female who presents for Medicare Annual (Subsequent) preventive examination.   Virtual Visit via Telephone Note  I connected with  Kiara Holt on 04/27/22 at  8:15 AM EDT by telephone and verified that I am speaking with the correct person using two identifiers.  Location: Patient: home  Provider: [facility]  Persons participating in the virtual visit: patient/Nurse Health Advisor   I discussed the limitations, risks, security and privacy concerns of performing an evaluation and management service by telephone and the availability of in person appointments. The patient expressed understanding and agreed to proceed.  Interactive audio and video telecommunications were attempted between this nurse and patient, however failed, due to patient having technical difficulties OR patient did not have access to video capability.  We continued and completed visit with audio only.  Some vital signs may be absent or patient reported.   Lorrene Reid, LPN  Review of Systems     Cardiac Risk Factors include: advanced age (>31men, >67 women);hypertension     Objective:    Today's Vitals   04/27/22 0815  Weight: 170 lb (77.1 kg)  Height: 5\' 5"  (1.651 m)   Body mass index is 28.29 kg/m.     04/27/2022    8:19 AM 04/20/2021    1:09 PM  Advanced Directives  Does Patient Have a Medical Advance Directive? Yes Yes  Type of 04/22/2021 of [place];Living will Living will;Healthcare Power of Attorney  Does patient want to make changes to medical advance directive?  No - Patient declined  Copy of Healthcare Power of Attorney in Chart? No - copy requested     Current Medications (verified) Outpatient Encounter Medications as of 04/27/2022  Medication Sig   amLODipine (NORVASC) 5 MG tablet TAKE 1 TABLET(5 MG) BY MOUTH DAILY   triamterene-hydrochlorothiazide (DYAZIDE) 37.5-25 MG capsule TAKE 1 CAPSULE BY MOUTH EVERY DAY    No facility-administered encounter medications on file as of 04/27/2022.    Allergies (verified) Patient has no known allergies.   History: Past Medical History:  Diagnosis Date   Arthritis    shoulder   Past Surgical History:  Procedure Laterality Date   ABDOMINAL HYSTERECTOMY  1972   COLONOSCOPY     Family History  Problem Relation Age of Onset   Kidney disease Mother    Arthritis Mother    Diabetes Mother    Breast cancer Maternal Grandmother    Cancer Neg Hx    Heart disease Neg Hx    Hyperlipidemia Neg Hx    Hypertension Neg Hx    Stroke Neg Hx    Colon cancer Neg Hx    Social History   Socioeconomic History   Marital status: Married    Spouse name: Not on file   Number of children: Not on file   Years of education: Not on file   Highest education level: Not on file  Occupational History   Not on file  Tobacco Use   Smoking status: Never   Smokeless tobacco: Never  Substance and Sexual Activity   Alcohol use: No   Drug use: No   Sexual activity: Never  Other Topics Concern   Not on file  Social History Narrative   Not on file   Social Determinants of Health   Financial Resource Strain: Low Risk  (04/27/2022)   Overall Financial Resource Strain (CARDIA)    Difficulty of Paying Living Expenses: Not hard at all  Food Insecurity: No Food Insecurity (04/27/2022)   Hunger Vital Sign    Worried About Running Out of Food in the Last Year: Never true    Ran Out of Food in the Last Year: Never true  Transportation Needs: No Transportation Needs (04/20/2021)   PRAPARE - Administrator, Civil Service (Medical): No    Lack of Transportation (Non-Medical): No  Physical Activity: Sufficiently Active (04/27/2022)   Exercise Vital Sign    Days of Exercise per Week: 3 days    Minutes of Exercise per Session: 60 min  Stress: No Stress Concern Present (04/27/2022)   [employer] of Occupational Health - Occupational Stress Questionnaire    Feeling  of Stress : Not at all  Social Connections: Moderately Integrated (04/27/2022)   Social Connection and Isolation Panel [NHANES]    Frequency of Communication with Friends and Family: More than three times a week    Frequency of Social Gatherings with Friends and Family: More than three times a week    Attends Religious Services: More than 4 times per year    Active Member of [employer] or Organizations: No    Attends Engineer, structural: Never    Marital Status: Married    Tobacco Counseling Counseling given: Not Answered   Clinical Intake:  Pre-visit preparation completed: Yes  Pain : No/denies pain     Nutritional Risks: None Diabetes: No  How often do you need to have someone help you when you read instructions, pamphlets, or other written materials from your doctor or pharmacy?: 1 - Never  Diabetic?no  Interpreter Needed?: No  Information entered by :: Renie Ora, LPN   Activities of Daily Living    04/27/2022    8:20 AM  In your present state of health, do you have any difficulty performing the following activities:  Hearing? 0  Vision? 0  Difficulty concentrating or making decisions? 0  Walking or climbing stairs? 0  Dressing or bathing? 0  Doing errands, shopping? 0  Preparing Food and eating ? N  Using the Toilet? N  In the past six months, have you accidently leaked urine? N  Do you have problems with loss of bowel control? N  Managing your Medications? N  Managing your Finances? N  Housekeeping or managing your Housekeeping? N    Patient Care Team: Etta Grandchild, MD as PCP - General (Internal Medicine)  Indicate any recent Medical Services you may have received from other than [facility] providers in the past year (date may be approximate).     Assessment:   This is a routine wellness examination for Kiara Holt.  Hearing/Vision screen Vision Screening - Comments:: Annual eye exams   Dietary issues and exercise activities discussed: Current  Exercise Habits: Home exercise routine, Type of exercise: walking, Time (Minutes): 60, Frequency (Times/Week): 3, Weekly Exercise (Minutes/Week): 180, Intensity: Mild, Exercise limited by: None identified   Goals Addressed             This Visit's Progress    DIET - INCREASE WATER INTAKE         Depression Screen    04/27/2022    8:18 AM 04/20/2021    1:13 PM 11/26/2019    2:17 PM 10/21/2017   12:53 PM  PHQ 2/9 Scores  PHQ - 2 Score 0 0 0 0    Fall Risk    04/27/2022    8:16 AM 04/20/2021    1:12 PM 11/26/2019    2:17 PM 10/27/2018  1:20 PM 10/27/2018    8:35 AM  Fall Risk   Falls in the past year? 0 0 0 0 0  Number falls in past yr: 0 0 0  0  Injury with Fall? 0 0 0  0  Risk for fall due to : No Fall Risks No Fall Risks No Fall Risks    Follow up Falls prevention discussed Falls evaluation completed Falls evaluation completed  Falls evaluation completed    FALL RISK PREVENTION PERTAINING TO THE HOME:  Any stairs in or around the home? Yes  If so, are there any without handrails? No  Home free of loose throw rugs in walkways, pet beds, electrical cords, etc? Yes  Adequate lighting in your home to reduce risk of falls? Yes   ASSISTIVE DEVICES UTILIZED TO PREVENT FALLS:  Life alert? No  Use of a cane, walker or w/c? No  Grab bars in the bathroom? No  Shower chair or bench in shower? No  Elevated toilet seat or a handicapped toilet? No      04/27/2022    8:20 AM  6CIT Screen  What Year? 0 points  What month? 0 points  What time? 0 points  Count back from 20 0 points  Months in reverse 0 points  Repeat phrase 0 points  Total Score 0 points    Immunizations Immunization History  Administered Date(s) Administered   Tdap 02/23/2010    TDAP status: Due, Education has been provided regarding the importance of this vaccine. Advised may receive this vaccine at local pharmacy or Health Dept. Aware to provide a copy of the vaccination record if obtained from local  pharmacy or Health Dept. Verbalized acceptance and understanding.  Flu Vaccine status: Due, Education has been provided regarding the importance of this vaccine. Advised may receive this vaccine at local pharmacy or Health Dept. Aware to provide a copy of the vaccination record if obtained from local pharmacy or Health Dept. Verbalized acceptance and understanding.  Pneumococcal vaccine status: Due, Education has been provided regarding the importance of this vaccine. Advised may receive this vaccine at local pharmacy or Health Dept. Aware to provide a copy of the vaccination record if obtained from local pharmacy or Health Dept. Verbalized acceptance and understanding.  Covid-19 vaccine status: Declined, Education has been provided regarding the importance of this vaccine but patient still declined. Advised may receive this vaccine at local pharmacy or Health Dept.or vaccine clinic. Aware to provide a copy of the vaccination record if obtained from local pharmacy or Health Dept. Verbalized acceptance and understanding.  Qualifies for Shingles Vaccine? Yes   Zostavax completed No   Shingrix Completed?: No.    Education has been provided regarding the importance of this vaccine. Patient has been advised to call insurance company to determine out of pocket expense if they have not yet received this vaccine. Advised may also receive vaccine at local pharmacy or Health Dept. Verbalized acceptance and understanding.  Screening Tests Health Maintenance  Topic Date Due   COVID-19 Vaccine (1) Never done   Zoster Vaccines- Shingrix (1 of 2) Never done   Pneumonia Vaccine 7265+ Years old (1 - PCV) Never done   DEXA SCAN  Never done   TETANUS/TDAP  10/30/2021   INFLUENZA VACCINE  03/06/2022   MAMMOGRAM  09/25/2022   COLONOSCOPY (Pts [id number] Insurance coverage will need to be confirmed)  09/24/2023   Hepatitis C Screening  Completed   HPV VACCINES  Aged Out    Health Maintenance  Health Maintenance  Due  Topic Date Due   COVID-19 Vaccine (1) Never done   Zoster Vaccines- Shingrix (1 of 2) Never done   Pneumonia Vaccine 48+ Years old (1 - PCV) Never done   DEXA SCAN  Never done   TETANUS/TDAP  10/30/2021   INFLUENZA VACCINE  03/06/2022    Colorectal cancer screening: Type of screening: Colonoscopy. Completed 09/23/2013. Repeat every 10 years  Mammogram status: Completed 09/25/2021. Repeat every year  Bone Density status: Ordered 04/27/2022. Pt provided with contact info and advised to call to schedule appt.  Lung Cancer Screening: (Low Dose CT Chest recommended if Age 33-80 years, 30 pack-year currently smoking OR have quit w/in 15years.) does not qualify.   Lung Cancer Screening Referral: n/a  Additional Screening:  Hepatitis C Screening: does not qualify;   Vision Screening: Recommended annual ophthalmology exams for early detection of glaucoma and other disorders of the eye. Is the patient up to date with their annual eye exam?  Yes  Who is the provider or what is the name of the office in which the patient attends annual eye exams? [facility]  If pt is not established with a provider, would they like to be referred to a provider to establish care? No .   Dental Screening: Recommended annual dental exams for proper oral hygiene  Community Resource Referral / Chronic Care Management: CRR required this visit?  No   CCM required this visit?  No      Plan:     I have personally reviewed and noted the following in the patient's chart:   Medical and social history Use of alcohol, tobacco or illicit drugs  Current medications and supplements including opioid prescriptions. Patient is not currently taking opioid prescriptions. Functional ability and status Nutritional status Physical activity Advanced directives List of other physicians Hospitalizations, surgeries, and ER visits in previous 12 months Vitals Screenings to include cognitive, depression, and  falls Referrals and appointments  In addition, I have reviewed and discussed with patient certain preventive protocols, quality metrics, and best practice recommendations. A written personalized care plan for preventive services as well as general preventive health recommendations were provided to patient.     Daphane Shepherd, LPN   [id number]   Nurse Notes: Due vaccines

## 2022-04-27 NOTE — Patient Instructions (Signed)
[name] , Thank you for taking time to come for your Medicare Wellness Visit. I appreciate your ongoing commitment to your health goals. Please review the following plan we discussed and let me know if I can assist you in the future.   These are the goals we discussed:  Goals      DIET - INCREASE WATER INTAKE        This is a list of the screening recommended for you and due dates:  Health Maintenance  Topic Date Due   COVID-19 Vaccine (1) Never done   Zoster (Shingles) Vaccine (1 of 2) Never done   Pneumonia Vaccine (1 - PCV) Never done   DEXA scan (bone density measurement)  Never done   Tetanus Vaccine  10/30/2021   Flu Shot  03/06/2022   Mammogram  09/25/2022   Colon Cancer Screening  09/24/2023   Hepatitis C Screening: USPSTF Recommendation to screen - Ages 18-79 yo.  Completed   HPV Vaccine  Aged Out    Advanced directives: Please bring a copy of your health care power of attorney and living will to the office to be added to your chart at your convenience.   Conditions/risks identified: Aim for 30 minutes of exercise or brisk walking, 6-8 glasses of water, and 5 servings of fruits and vegetables each day.   Next appointment: Follow up in one year for your annual wellness visit    Preventive Care 65 Years and Older, Female Preventive care refers to lifestyle choices and visits with your health care provider that can promote health and wellness. What does preventive care include? A yearly physical exam. This is also called an annual well check. Dental exams once or twice a year. Routine eye exams. Ask your health care provider how often you should have your eyes checked. Personal lifestyle choices, including: Daily care of your teeth and gums. Regular physical activity. Eating a healthy diet. Avoiding tobacco and drug use. Limiting alcohol use. Practicing safe sex. Taking low-dose aspirin every day. Taking vitamin and mineral supplements as recommended by your  health care provider. What happens during an annual well check? The services and screenings done by your health care provider during your annual well check will depend on your age, overall health, lifestyle risk factors, and family history of disease. Counseling  Your health care provider may ask you questions about your: Alcohol use. Tobacco use. Drug use. Emotional well-being. Home and relationship well-being. Sexual activity. Eating habits. History of falls. Memory and ability to understand (cognition). Work and work Statistician. Reproductive health. Screening  You may have the following tests or measurements: Height, weight, and BMI. Blood pressure. Lipid and cholesterol levels. These may be checked every 5 years, or more frequently if you are over 42 years old. Skin check. Lung cancer screening. You may have this screening every year starting at age 37 if you have a 30-pack-year history of smoking and currently smoke or have quit within the past 15 years. Fecal occult blood test (FOBT) of the stool. You may have this test every year starting at age 48. Flexible sigmoidoscopy or colonoscopy. You may have a sigmoidoscopy every 5 years or a colonoscopy every 10 years starting at age 67. Hepatitis C blood test. Hepatitis B blood test. Sexually transmitted disease (STD) testing. Diabetes screening. This is done by checking your blood sugar (glucose) after you have not eaten for a while (fasting). You may have this done every 1-3 years. Bone density scan. This is done to  screen for osteoporosis. You may have this done starting at age 93. Mammogram. This may be done every 1-2 years. Talk to your health care provider about how often you should have regular mammograms. Talk with your health care provider about your test results, treatment options, and if necessary, the need for more tests. Vaccines  Your health care provider may recommend certain vaccines, such as: Influenza vaccine.  This is recommended every year. Tetanus, diphtheria, and acellular pertussis (Tdap, Td) vaccine. You may need a Td booster every 10 years. Zoster vaccine. You may need this after age 59. Pneumococcal 13-valent conjugate (PCV13) vaccine. One dose is recommended after age 39. Pneumococcal polysaccharide (PPSV23) vaccine. One dose is recommended after age 10. Talk to your health care provider about which screenings and vaccines you need and how often you need them. This information is not intended to replace advice given to you by your health care provider. Make sure you discuss any questions you have with your health care provider. Document Released: 08/19/2015 Document Revised: 04/11/2016 Document Reviewed: 05/24/2015 Elsevier Interactive Patient Education  2017 [employer] Prevention in the Home Falls can cause injuries. They can happen to people of all ages. There are many things you can do to make your home safe and to help prevent falls. What can I do on the outside of my home? Regularly fix the edges of walkways and driveways and fix any cracks. Remove anything that might make you trip as you walk through a door, such as a raised step or threshold. Trim any bushes or trees on the path to your home. Use bright outdoor lighting. Clear any walking paths of anything that might make someone trip, such as rocks or tools. Regularly check to see if handrails are loose or broken. Make sure that both sides of any steps have handrails. Any raised decks and porches should have guardrails on the edges. Have any leaves, snow, or ice cleared regularly. Use sand or salt on walking paths during winter. Clean up any spills in your garage right away. This includes oil or grease spills. What can I do in the bathroom? Use night lights. Install grab bars by the toilet and in the tub and shower. Do not use towel bars as grab bars. Use non-skid mats or decals in the tub or shower. If you need to sit  down in the shower, use a plastic, non-slip stool. Keep the floor dry. Clean up any water that spills on the floor as soon as it happens. Remove soap buildup in the tub or shower regularly. Attach bath mats securely with double-sided non-slip rug tape. Do not have throw rugs and other things on the floor that can make you trip. What can I do in the bedroom? Use night lights. Make sure that you have a light by your bed that is easy to reach. Do not use any sheets or blankets that are too big for your bed. They should not hang down onto the floor. Have a firm chair that has side arms. You can use this for support while you get dressed. Do not have throw rugs and other things on the floor that can make you trip. What can I do in the kitchen? Clean up any spills right away. Avoid walking on wet floors. Keep items that you use a lot in easy-to-reach places. If you need to reach something above you, use a strong step stool that has a grab bar. Keep electrical cords out of the way.  Do not use floor polish or wax that makes floors slippery. If you must use wax, use non-skid floor wax. Do not have throw rugs and other things on the floor that can make you trip. What can I do with my stairs? Do not leave any items on the stairs. Make sure that there are handrails on both sides of the stairs and use them. Fix handrails that are broken or loose. Make sure that handrails are as long as the stairways. Check any carpeting to make sure that it is firmly attached to the stairs. Fix any carpet that is loose or worn. Avoid having throw rugs at the top or bottom of the stairs. If you do have throw rugs, attach them to the floor with carpet tape. Make sure that you have a light switch at the top of the stairs and the bottom of the stairs. If you do not have them, ask someone to add them for you. What else can I do to help prevent falls? Wear shoes that: Do not have high heels. Have rubber bottoms. Are  comfortable and fit you well. Are closed at the toe. Do not wear sandals. If you use a stepladder: Make sure that it is fully opened. Do not climb a closed stepladder. Make sure that both sides of the stepladder are locked into place. Ask someone to hold it for you, if possible. Clearly mark and make sure that you can see: Any grab bars or handrails. First and last steps. Where the edge of each step is. Use tools that help you move around (mobility aids) if they are needed. These include: Canes. Walkers. Scooters. Crutches. Turn on the lights when you go into a dark area. Replace any light bulbs as soon as they burn out. Set up your furniture so you have a clear path. Avoid moving your furniture around. If any of your floors are uneven, fix them. If there are any pets around you, be aware of where they are. Review your medicines with your doctor. Some medicines can make you feel dizzy. This can increase your chance of falling. Ask your doctor what other things that you can do to help prevent falls. This information is not intended to replace advice given to you by your health care provider. Make sure you discuss any questions you have with your health care provider. Document Released: 05/19/2009 Document Revised: 12/29/2015 Document Reviewed: 08/27/2014 Elsevier Interactive Patient Education  2017 [employer].

## 2022-06-11 ENCOUNTER — Other Ambulatory Visit: Payer: Self-pay | Admitting: Internal Medicine

## 2022-06-11 DIAGNOSIS — Z780 Asymptomatic menopausal state: Secondary | ICD-10-CM

## 2022-06-11 DIAGNOSIS — Z0000 Encounter for general adult medical examination without abnormal findings: Secondary | ICD-10-CM

## 2022-06-14 ENCOUNTER — Ambulatory Visit
Admission: RE | Admit: 2022-06-14 | Discharge: 2022-06-14 | Disposition: A | Discharge status: Home / Self Care | Payer: Medicare Other | Source: Ambulatory Visit | Attending: Internal Medicine | Admitting: Internal Medicine

## 2022-06-14 DIAGNOSIS — Z780 Asymptomatic menopausal state: Secondary | ICD-10-CM

## 2022-07-05 ENCOUNTER — Other Ambulatory Visit: Payer: Self-pay | Admitting: Internal Medicine

## 2022-07-05 DIAGNOSIS — I10 Essential (primary) hypertension: Secondary | ICD-10-CM

## 2022-07-07 ENCOUNTER — Other Ambulatory Visit: Payer: Self-pay | Admitting: Internal Medicine

## 2022-07-07 DIAGNOSIS — I10 Essential (primary) hypertension: Secondary | ICD-10-CM

## 2022-08-14 ENCOUNTER — Other Ambulatory Visit: Payer: Self-pay | Admitting: Internal Medicine

## 2022-08-14 DIAGNOSIS — Z1231 Encounter for screening mammogram for malignant neoplasm of breast: Secondary | ICD-10-CM

## 2022-10-02 ENCOUNTER — Other Ambulatory Visit: Payer: Self-pay | Admitting: Internal Medicine

## 2022-10-02 DIAGNOSIS — I10 Essential (primary) hypertension: Secondary | ICD-10-CM

## 2022-10-03 ENCOUNTER — Other Ambulatory Visit: Payer: Self-pay | Admitting: Internal Medicine

## 2022-10-03 DIAGNOSIS — I10 Essential (primary) hypertension: Secondary | ICD-10-CM

## 2022-10-22 ENCOUNTER — Ambulatory Visit
Admission: RE | Admit: 2022-10-22 | Discharge: 2022-10-22 | Disposition: A | Discharge status: Home / Self Care | Payer: Medicare Other | Source: Ambulatory Visit | Attending: Internal Medicine | Admitting: Internal Medicine

## 2022-10-22 DIAGNOSIS — Z1231 Encounter for screening mammogram for malignant neoplasm of breast: Secondary | ICD-10-CM

## 2022-10-24 ENCOUNTER — Other Ambulatory Visit: Payer: Self-pay | Admitting: Internal Medicine

## 2022-10-24 DIAGNOSIS — R928 Other abnormal and inconclusive findings on diagnostic imaging of breast: Secondary | ICD-10-CM

## 2022-11-05 ENCOUNTER — Other Ambulatory Visit: Payer: Self-pay | Admitting: Internal Medicine

## 2022-11-05 ENCOUNTER — Telehealth: Payer: Self-pay | Admitting: Internal Medicine

## 2022-11-05 DIAGNOSIS — I10 Essential (primary) hypertension: Secondary | ICD-10-CM

## 2022-11-05 MED ORDER — TRIAMTERENE-HCTZ 37.5-25 MG PO CAPS: 1.0000 | ORAL_CAPSULE | Freq: Every day | ORAL | 0 refills | Status: DC

## 2022-11-05 MED ORDER — AMLODIPINE BESYLATE 5 MG PO TABS: 5.0000 mg | ORAL_TABLET | Freq: Every day | ORAL | 0 refills | Status: DC

## 2022-11-05 NOTE — Telephone Encounter (Signed)
Prescription Request  11/05/2022  LOV: 11/28/2021 (Did set up PT with next available next week. PT only has enough of this script to make it through the end of the week)  What is the name of the medication or equipment?  amLODipine (NORVASC) 5 MG tablet  Have you contacted your pharmacy to request a refill? Yes   Which pharmacy would you like this sent to?  [facility] [id number] - [place] [address], [place] [address] [place] [address] Phone: [phone] Fax: [phone]    Patient notified that their request is being sent to the clinical staff for review and that they should receive a response within 2 business days.   Please advise at [place]

## 2022-11-06 ENCOUNTER — Ambulatory Visit
Admission: RE | Admit: 2022-11-06 | Discharge: 2022-11-06 | Disposition: A | Discharge status: Home / Self Care | Payer: Medicare Other | Source: Ambulatory Visit | Attending: Internal Medicine | Admitting: Internal Medicine

## 2022-11-06 ENCOUNTER — Ambulatory Visit: Admission: RE | Admit: 2022-11-06 | Payer: Medicare Other | Source: Ambulatory Visit

## 2022-11-06 DIAGNOSIS — R928 Other abnormal and inconclusive findings on diagnostic imaging of breast: Secondary | ICD-10-CM

## 2022-11-14 ENCOUNTER — Encounter: Payer: Self-pay | Admitting: Internal Medicine

## 2022-11-14 ENCOUNTER — Ambulatory Visit: Payer: Medicare Other | Admitting: Internal Medicine

## 2022-11-14 VITALS — BP 142/88 | HR 90 | Temp 98.2°F | Resp 16 | Ht 65.0 in | Wt 174.0 lb

## 2022-11-14 DIAGNOSIS — R7303 Prediabetes: Secondary | ICD-10-CM

## 2022-11-14 DIAGNOSIS — I10 Essential (primary) hypertension: Secondary | ICD-10-CM

## 2022-11-14 DIAGNOSIS — E785 Hyperlipidemia, unspecified: Secondary | ICD-10-CM

## 2022-11-14 DIAGNOSIS — I491 Atrial premature depolarization: Secondary | ICD-10-CM | POA: Diagnosis not present

## 2022-11-14 DIAGNOSIS — Z0000 Encounter for general adult medical examination without abnormal findings: Secondary | ICD-10-CM

## 2022-11-14 LAB — CBC WITH DIFFERENTIAL/PLATELET
Basophils Absolute: 0 10*3/uL (ref 0.0–0.1)
Basophils Relative: 0.4 % (ref 0.0–3.0)
Eosinophils Absolute: 0 10*3/uL (ref 0.0–0.7)
Eosinophils Relative: 0.7 % (ref 0.0–5.0)
HCT: 40.4 % (ref 36.0–46.0)
Hemoglobin: 13.2 g/dL (ref 12.0–15.0)
Lymphocytes Relative: 23.2 % (ref 12.0–46.0)
Lymphs Abs: 1.2 10*3/uL (ref 0.7–4.0)
MCHC: 32.7 g/dL (ref 30.0–36.0)
MCV: 84.4 fl (ref 78.0–100.0)
Monocytes Absolute: 0.7 10*3/uL (ref 0.1–1.0)
Monocytes Relative: 13.6 % — ABNORMAL HIGH (ref 3.0–12.0)
Neutro Abs: 3.1 10*3/uL (ref 1.4–7.7)
Neutrophils Relative %: 62.1 % (ref 43.0–77.0)
Platelets: 204 10*3/uL (ref 150.0–400.0)
RBC: 4.79 Mil/uL (ref 3.87–5.11)
RDW: 14.6 % (ref 11.5–15.5)
WBC: 5 10*3/uL (ref 4.0–10.5)

## 2022-11-14 LAB — HEPATIC FUNCTION PANEL
ALT: 20 U/L (ref 0–35)
AST: 26 U/L (ref 0–37)
Albumin: 4.4 g/dL (ref 3.5–5.2)
Alkaline Phosphatase: 59 U/L (ref 39–117)
Bilirubin, Direct: 0.1 mg/dL (ref 0.0–0.3)
Total Bilirubin: 0.6 mg/dL (ref 0.2–1.2)
Total Protein: 7.6 g/dL (ref 6.0–8.3)

## 2022-11-14 LAB — URINALYSIS, ROUTINE W REFLEX MICROSCOPIC
Bilirubin Urine: NEGATIVE
Hgb urine dipstick: NEGATIVE
Ketones, ur: NEGATIVE
Nitrite: NEGATIVE
Specific Gravity, Urine: 1.015 (ref 1.000–1.030)
Total Protein, Urine: NEGATIVE
Urine Glucose: NEGATIVE
Urobilinogen, UA: 0.2 (ref 0.0–1.0)
pH: 8 (ref 5.0–8.0)

## 2022-11-14 LAB — LIPID PANEL
Cholesterol: 208 mg/dL — ABNORMAL HIGH (ref 0–200)
HDL: 58.9 mg/dL (ref >39.00)
LDL Cholesterol: 133 mg/dL — ABNORMAL HIGH (ref 0–99)
NonHDL: 148.85
Total CHOL/HDL Ratio: 4
Triglycerides: 78 mg/dL (ref 0.0–149.0)
VLDL: 15.6 mg/dL (ref 0.0–40.0)

## 2022-11-14 LAB — BASIC METABOLIC PANEL
BUN: 13 mg/dL (ref 6–23)
CO2: 30 mEq/L (ref 19–32)
Calcium: 10 mg/dL (ref 8.4–10.5)
Chloride: 97 mEq/L (ref 96–112)
Creatinine, Ser: 0.82 mg/dL (ref 0.40–1.20)
GFR: 72.78 mL/min (ref >60.00)
Glucose, Bld: 125 mg/dL — ABNORMAL HIGH (ref 70–99)
Potassium: 3.7 mEq/L (ref 3.5–5.1)
Sodium: 137 mEq/L (ref 135–145)

## 2022-11-14 LAB — TSH: TSH: 2.16 u[IU]/mL (ref 0.35–5.50)

## 2022-11-14 LAB — HEMOGLOBIN A1C: Hgb A1c MFr Bld: 6.1 % (ref 4.6–6.5)

## 2022-11-14 MED ORDER — ROSUVASTATIN CALCIUM 10 MG PO TABS
10.0000 mg | ORAL_TABLET | Freq: Every day | ORAL | 1 refills | Status: DC
Start: 2022-11-14 — End: 2023-11-11

## 2022-11-14 NOTE — Patient Instructions (Signed)

## 2022-11-14 NOTE — Progress Notes (Signed)
Subjective:  Patient ID: Kiara Holt, female    DOB: [birth year]  Age: 72 y.o. MRN: [id number]  CC: Annual Exam, Hypertension, and Hyperlipidemia   HPI Kiara Holt presents for a CPX and f/up --  She walks 2 miles/day and has good endurance. She denies DOE, CP, SOB, edema, palpitations.  Outpatient Medications Prior to Visit  Medication Sig Dispense Refill   amLODipine (NORVASC) 5 MG tablet Take 1 tablet (5 mg total) by mouth daily. 30 tablet 0   triamterene-hydrochlorothiazide (DYAZIDE) 37.5-25 MG capsule Take 1 each (1 capsule total) by mouth daily. 30 capsule 0   No facility-administered medications prior to visit.    ROS Review of Systems  Constitutional: Negative.  Negative for chills, diaphoresis, fatigue and fever.  HENT: Negative.    Eyes: Negative.   Respiratory: Negative.  Negative for cough, chest tightness, shortness of breath and wheezing.   Cardiovascular:  Negative for chest pain, palpitations and leg swelling.  Gastrointestinal:  Negative for abdominal pain, constipation, diarrhea, nausea and vomiting.  Endocrine: Negative.   Genitourinary: Negative.   Musculoskeletal: Negative.  Negative for arthralgias and myalgias.  Skin:  Negative for color change, pallor and rash.  Neurological:  Negative for dizziness, weakness and light-headedness.  Hematological:  Negative for adenopathy. Does not bruise/bleed easily.  Psychiatric/Behavioral: Negative.      Objective:  BP (!) 142/88 (BP Location: Left Arm, Patient Position: Sitting, Cuff Size: Large)   Pulse 90   Temp 98.2 F (36.8 C) (Oral)   Resp 16   Ht 5\' 5"  (1.651 m)   Wt 174 lb (78.9 kg)   SpO2 93%   BMI 28.96 kg/m   BP Readings from Last 3 Encounters:  11/14/22 (!) 142/88  11/28/21 (!) 152/90  08/30/21 (!) 174/76    Wt Readings from Last 3 Encounters:  11/14/22 174 lb (78.9 kg)  04/27/22 170 lb (77.1 kg)  11/28/21 170 lb (77.1 kg)    Physical Exam Vitals reviewed.  Constitutional:       Appearance: Normal appearance.  HENT:     Nose: Nose normal.     Mouth/Throat:     Mouth: Mucous membranes are moist.  Eyes:     General: No scleral icterus.    Conjunctiva/sclera: Conjunctivae normal.  Cardiovascular:     Rate and Rhythm: Normal rate. Frequent Extrasystoles are present.    Pulses: Normal pulses.     Heart sounds: No murmur heard.    Comments: EKG- SR with PAC's/bigeminy TWI in III No Q waves or LVH Pulmonary:     Effort: Pulmonary effort is normal.     Breath sounds: No stridor. No wheezing, rhonchi or rales.  Abdominal:     General: Abdomen is flat.     Palpations: There is no mass.     Tenderness: There is no abdominal tenderness. There is no guarding.     Hernia: No hernia is present.  Musculoskeletal:     Cervical back: Neck supple.     Right lower leg: No edema.     Left lower leg: No edema.  Lymphadenopathy:     Cervical: No cervical adenopathy.  Skin:    General: Skin is warm and dry.  Neurological:     General: No focal deficit present.     Mental Status: She is alert.  Psychiatric:        Mood and Affect: Mood normal.        Behavior: Behavior normal.     Lab Results  Component Value Date   WBC 5.0 11/14/2022   HGB 13.2 11/14/2022   HCT 40.4 11/14/2022   PLT 204.0 11/14/2022   GLUCOSE 125 (H) 11/14/2022   CHOL 208 (H) 11/14/2022   TRIG 78.0 11/14/2022   HDL 58.90 11/14/2022   LDLDIRECT 137.1 11/17/2008   LDLCALC 133 (H) 11/14/2022   ALT 20 11/14/2022   AST 26 11/14/2022   NA 137 11/14/2022   K 3.7 11/14/2022   CL 97 11/14/2022   CREATININE 0.82 11/14/2022   BUN 13 11/14/2022   CO2 30 11/14/2022   TSH 2.16 11/14/2022   HGBA1C 6.1 11/14/2022    MM 3D DIAGNOSTIC MAMMOGRAM UNILATERAL LEFT BREAST  Result Date: 11/06/2022 CLINICAL DATA:  Patient returns today to evaluate a possible LEFT breast asymmetry questioned on recent screening mammogram. EXAM: DIGITAL DIAGNOSTIC UNILATERAL LEFT MAMMOGRAM WITH TOMOSYNTHESIS TECHNIQUE:  Left digital diagnostic mammography and breast tomosynthesis was performed. COMPARISON:  Previous exams including recent screening mammogram dated 10/22/2022. ACR Breast Density Category c: The breasts are heterogeneously dense, which may obscure small masses. FINDINGS: On today's additional diagnostic views, there is no persistent asymmetry within the inner LEFT breast indicating superimposition of normal fibroglandular tissues. IMPRESSION: No evidence of malignancy. Patient may return to routine annual bilateral screening mammogram schedule. RECOMMENDATION: Screening mammogram in one year.(Code:SM-B-01Y) I have discussed the findings and recommendations with the patient. If applicable, a reminder letter will be sent to the patient regarding the next appointment. BI-RADS CATEGORY  1: Negative. Electronically Signed   By: Bary RichardStan  Maynard M.D.   On: 11/06/2022 10:31   Assessment & Plan:   Essential hypertension, benign- Her BP is not at goal. Will restart the antihypertensives. -     Basic metabolic panel; Future -     CBC with Differential/Platelet; Future -     TSH; Future -     Hepatic function panel; Future -     Urinalysis, Routine w reflex microscopic; Future -     EKG 12-Lead -     amLODIPine Besylate; Take 1 tablet (5 mg total) by mouth daily.  Dispense: 90 tablet; Refill: 1 -     Triamterene-HCTZ; Take 1 each (1 capsule total) by mouth daily.  Dispense: 90 capsule; Refill: 1  Hyperlipidemia LDL goal <130- Will start a statin for CV risk reduction. -     Lipid panel; Future -     TSH; Future -     Hepatic function panel; Future -     CT CARDIAC SCORING (SELF PAY ONLY); Future -     Rosuvastatin Calcium; Take 1 tablet (10 mg total) by mouth daily.  Dispense: 90 tablet; Refill: 1  Prediabetes -     Basic metabolic panel; Future -     Hemoglobin A1c; Future  Routine general medical examination at a health care facility- Exam completed, labs reviewed, vaccines reviewed, cancer screenings  are UTD, pt ed material was given.  PAC (premature atrial contraction)- She is asx. EKG is reassuring.     Follow-up: Return in about 6 months (around 05/16/2023).  Sanda Lingerhomas Merlinda Wrubel, MD

## 2022-11-15 MED ORDER — TRIAMTERENE-HCTZ 37.5-25 MG PO CAPS
1.0000 | ORAL_CAPSULE | Freq: Every day | ORAL | 1 refills | Status: DC
Start: 2022-11-15 — End: 2023-01-01

## 2022-11-15 MED ORDER — AMLODIPINE BESYLATE 5 MG PO TABS: 5.0000 mg | ORAL_TABLET | Freq: Every day | ORAL | 1 refills | Status: DC

## 2023-01-01 ENCOUNTER — Other Ambulatory Visit: Payer: Self-pay | Admitting: Internal Medicine

## 2023-01-01 DIAGNOSIS — I10 Essential (primary) hypertension: Secondary | ICD-10-CM

## 2023-02-28 ENCOUNTER — Other Ambulatory Visit: Payer: Self-pay | Admitting: Internal Medicine

## 2023-02-28 DIAGNOSIS — I10 Essential (primary) hypertension: Secondary | ICD-10-CM

## 2023-03-21 ENCOUNTER — Encounter (INDEPENDENT_AMBULATORY_CARE_PROVIDER_SITE_OTHER): Payer: Self-pay

## 2023-04-29 ENCOUNTER — Ambulatory Visit (INDEPENDENT_AMBULATORY_CARE_PROVIDER_SITE_OTHER): Payer: Medicare Other

## 2023-04-29 VITALS — Ht 65.5 in | Wt 174.0 lb

## 2023-04-29 DIAGNOSIS — Z0000 Encounter for general adult medical examination without abnormal findings: Secondary | ICD-10-CM

## 2023-04-29 NOTE — Patient Instructions (Signed)
Kiara Holt , Thank you for taking time to come for your Medicare Wellness Visit. I appreciate your ongoing commitment to your health goals. Please review the following plan we discussed and let me know if I can assist you in the future.   Referrals/Orders/Follow-Ups/Clinician Recommendations: Each day, aim for 6 glasses of water, plenty of protein in your diet and try to get up and walk/ stretch every hour for 5-10 minutes at a time.    This is a list of the screening recommended for you and due dates:  Health Maintenance  Topic Date Due   Zoster (Shingles) Vaccine (1 of 2) Never done   Flu Shot  03/07/2023   COVID-19 Vaccine (1 - 2023-24 season) Never done   Pneumonia Vaccine (1 of 1 - PCV) 11/14/2023*   Colon Cancer Screening  09/24/2023   Mammogram  10/22/2023   Medicare Annual Wellness Visit  04/28/2024   DEXA scan (bone density measurement)  Completed   Hepatitis C Screening  Completed   HPV Vaccine  Aged Out   DTaP/Tdap/Td vaccine  Discontinued  *Topic was postponed. The date shown is not the original due date.    Advanced directives: (In Chart) A copy of your advanced directives are scanned into your chart should your provider ever need it.  Next Medicare Annual Wellness Visit scheduled for next year: Yes

## 2023-04-29 NOTE — Progress Notes (Signed)
Subjective:   Kiara Holt is a 72 y.o. female who presents for Medicare Annual (Subsequent) preventive examination.  Visit Complete: Virtual  I connected with  Almond Lint on 04/29/23 by a audio enabled telemedicine application and verified that I am speaking with the correct person using two identifiers.  Patient Location: Home  Provider Location: Home Office  I discussed the limitations of evaluation and management by telemedicine. The patient expressed understanding and agreed to proceed.  Vital Signs: Because this visit was a virtual/telehealth visit, some criteria may be missing or patient reported. Any vitals not documented were not able to be obtained and vitals that have been documented are patient reported.    Cardiac Risk Factors include: advanced age (>16men, >15 women);hypertension;dyslipidemia     Objective:    Today's Vitals   04/29/23 0817  Weight: 174 lb (78.9 kg)  Height: 5' 5.5" (1.664 m)   Body mass index is 28.51 kg/m.     04/29/2023    8:20 AM 04/27/2022    8:19 AM 04/20/2021    1:09 PM  Advanced Directives  Does Patient Have a Medical Advance Directive? Yes Yes Yes  Type of Estate agent of [place];Living will Healthcare Power of [place];Living will Living will;Healthcare Power of Attorney  Does patient want to make changes to medical advance directive? No - Patient declined  No - Patient declined  Copy of Healthcare Power of Attorney in Chart? Yes - validated most recent copy scanned in chart (See row information) No - copy requested     Current Medications (verified) Outpatient Encounter Medications as of 04/29/2023  Medication Sig   amLODipine (NORVASC) 5 MG tablet TAKE 1 TABLET(5 MG) BY MOUTH DAILY   triamterene-hydrochlorothiazide (DYAZIDE) 37.5-25 MG capsule TAKE 1 CAPSULE BY MOUTH DAILY   rosuvastatin (CRESTOR) 10 MG tablet Take 1 tablet (10 mg total) by mouth daily. (Patient not taking: Reported on 04/29/2023)    No facility-administered encounter medications on file as of 04/29/2023.    Allergies (verified) Patient has no known allergies.   History: Past Medical History:  Diagnosis Date   Arthritis    shoulder   Past Surgical History:  Procedure Laterality Date   ABDOMINAL HYSTERECTOMY  1972   COLONOSCOPY     Family History  Problem Relation Age of Onset   Kidney disease Mother    Arthritis Mother    Diabetes Mother    Breast cancer Maternal Grandmother    Cancer Neg Hx    Heart disease Neg Hx    Hyperlipidemia Neg Hx    Hypertension Neg Hx    Stroke Neg Hx    Colon cancer Neg Hx    Social History   Socioeconomic History   Marital status: Married    Spouse name: Nadine Counts   Number of children: Not on file   Years of education: Not on file   Highest education level: Not on file  Occupational History   Occupation: Retired  Tobacco Use   Smoking status: Never   Smokeless tobacco: Never  Vaping Use   Vaping status: Never Used  Substance and Sexual Activity   Alcohol use: No   Drug use: No   Sexual activity: Never  Other Topics Concern   Not on file  Social History Narrative   Lives with husband.   Social Determinants of Health   Financial Resource Strain: Low Risk  (04/29/2023)   Overall Financial Resource Strain (CARDIA)    Difficulty of Paying Living Expenses: Not  hard at all  Food Insecurity: No Food Insecurity (04/29/2023)   Hunger Vital Sign    Worried About Running Out of Food in the Last Year: Never true    Ran Out of Food in the Last Year: Never true  Transportation Needs: No Transportation Needs (04/29/2023)   PRAPARE - Administrator, Civil Service (Medical): No    Lack of Transportation (Non-Medical): No  Physical Activity: Sufficiently Active (04/29/2023)   Exercise Vital Sign    Days of Exercise per Week: 3 days    Minutes of Exercise per Session: 60 min  Stress: No Stress Concern Present (04/29/2023)   [employer] of Occupational  Health - Occupational Stress Questionnaire    Feeling of Stress : Not at all  Social Connections: Moderately Integrated (04/29/2023)   Social Connection and Isolation Panel [NHANES]    Frequency of Communication with Friends and Family: More than three times a week    Frequency of Social Gatherings with Friends and Family: More than three times a week    Attends Religious Services: More than 4 times per year    Active Member of [employer] or Organizations: No    Attends Engineer, structural: Never    Marital Status: Married    Tobacco Counseling Counseling given: Not Answered   Clinical Intake:     Pain : No/denies pain     BMI - recorded: 28.51 Nutritional Status: BMI 25 -29 Overweight Nutritional Risks: None Diabetes: No  How often do you need to have someone help you when you read instructions, pamphlets, or other written materials from your doctor or pharmacy?: 1 - Never  Interpreter Needed?: No  Information entered by :: Rondel Episcopo, RMA   Activities of Daily Living    04/29/2023    8:18 AM  In your present state of health, do you have any difficulty performing the following activities:  Hearing? 0  Vision? 0  Difficulty concentrating or making decisions? 0  Walking or climbing stairs? 0  Dressing or bathing? 0  Doing errands, shopping? 0  Preparing Food and eating ? N  Using the Toilet? N  In the past six months, have you accidently leaked urine? N  Do you have problems with loss of bowel control? N  Managing your Medications? N  Managing your Finances? N  Housekeeping or managing your Housekeeping? N    Patient Care Team: Etta Grandchild, MD as PCP - General (Internal Medicine)  Indicate any recent Medical Services you may have received from other than [facility] providers in the past year (date may be approximate).     Assessment:   This is a routine wellness examination for Kiara Holt.  Hearing/Vision screen Hearing Screening - Comments:: Denies  hearing difficulties   Vision Screening - Comments:: Denies vision issues   Goals Addressed             This Visit's Progress    DIET - INCREASE WATER INTAKE   On track     Depression Screen    04/29/2023    8:30 AM 04/27/2022    8:18 AM 04/20/2021    1:13 PM 11/26/2019    2:17 PM 10/21/2017   12:53 PM  PHQ 2/9 Scores  PHQ - 2 Score 0 0 0 0 0  PHQ- 9 Score 0        Fall Risk    04/29/2023    8:24 AM 04/27/2022    8:16 AM 04/20/2021    1:12 PM 11/26/2019  2:17 PM 10/27/2018    1:20 PM  Fall Risk   Falls in the past year? 0 0 0 0 0  Number falls in past yr: 0 0 0 0   Injury with Fall? 0 0 0 0   Risk for fall due to : No Fall Risks No Fall Risks No Fall Risks No Fall Risks   Follow up Falls prevention discussed;Falls evaluation completed Falls prevention discussed Falls evaluation completed Falls evaluation completed     MEDICARE RISK AT HOME: Medicare Risk at Home Any stairs in or around the home?: Yes If so, are there any without handrails?: Yes Home free of loose throw rugs in walkways, pet beds, electrical cords, etc?: Yes Adequate lighting in your home to reduce risk of falls?: Yes Life alert?: No Use of a cane, walker or w/c?: No Grab bars in the bathroom?: Yes Shower chair or bench in shower?: No Elevated toilet seat or a handicapped toilet?: No  TIMED UP AND GO:  Was the test performed?  No    Cognitive Function:        04/29/2023    8:25 AM 04/27/2022    8:20 AM  6CIT Screen  What Year? 0 points 0 points  What month? 0 points 0 points  What time? 0 points 0 points  Count back from 20 0 points 0 points  Months in reverse 2 points 0 points  Repeat phrase 4 points 0 points  Total Score 6 points 0 points    Immunizations Immunization History  Administered Date(s) Administered   Tdap 02/23/2010    TDAP status: Due, Education has been provided regarding the importance of this vaccine. Advised may receive this vaccine at local pharmacy or Health  Dept. Aware to provide a copy of the vaccination record if obtained from local pharmacy or Health Dept. Verbalized acceptance and understanding.  Flu Vaccine status: Declined, Education has been provided regarding the importance of this vaccine but patient still declined. Advised may receive this vaccine at local pharmacy or Health Dept. Aware to provide a copy of the vaccination record if obtained from local pharmacy or Health Dept. Verbalized acceptance and understanding.  Pneumococcal vaccine status: Declined,  Education has been provided regarding the importance of this vaccine but patient still declined. Advised may receive this vaccine at local pharmacy or Health Dept. Aware to provide a copy of the vaccination record if obtained from local pharmacy or Health Dept. Verbalized acceptance and understanding.   Covid-19 vaccine status: Declined, Education has been provided regarding the importance of this vaccine but patient still declined. Advised may receive this vaccine at local pharmacy or Health Dept.or vaccine clinic. Aware to provide a copy of the vaccination record if obtained from local pharmacy or Health Dept. Verbalized acceptance and understanding.  Qualifies for Shingles Vaccine? Yes   Zostavax completed No   Shingrix Completed?: No.    Education has been provided regarding the importance of this vaccine. Patient has been advised to call insurance company to determine out of pocket expense if they have not yet received this vaccine. Advised may also receive vaccine at local pharmacy or Health Dept. Verbalized acceptance and understanding.  Screening Tests Health Maintenance  Topic Date Due   Zoster Vaccines- Shingrix (1 of 2) Never done   INFLUENZA VACCINE  03/07/2023   COVID-19 Vaccine (1 - 2023-24 season) Never done   Pneumonia Vaccine 93+ Years old (1 of 1 - PCV) 11/14/2023 (Originally 01/21/2018)   Colonoscopy  09/24/2023   MAMMOGRAM  10/22/2023   Medicare Annual Wellness (AWV)   04/28/2024   DEXA SCAN  Completed   Hepatitis C Screening  Completed   HPV VACCINES  Aged Out   DTaP/Tdap/Td  Discontinued    Health Maintenance  Health Maintenance Due  Topic Date Due   Zoster Vaccines- Shingrix (1 of 2) Never done   INFLUENZA VACCINE  03/07/2023   COVID-19 Vaccine (1 - 2023-24 season) Never done    Colorectal cancer screening: Type of screening: Colonoscopy. Completed 09/24/2023. Repeat every 10 years  Mammogram status: Completed 10/22/2022. Repeat every year  Bone Density status: Completed 06/14/2022. Results reflect: Bone density results: NORMAL. Repeat every 2 years.  Lung Cancer Screening: (Low Dose CT Chest recommended if Age 41-80 years, 20 pack-year currently smoking OR have quit w/in 15years.) does not qualify.   Lung Cancer Screening Referral: N/A  Additional Screening:  Hepatitis C Screening: does qualify; Completed 02/17/2016  Vision Screening: Recommended annual ophthalmology exams for early detection of glaucoma and other disorders of the eye. Is the patient up to date with their annual eye exam?  Yes  Who is the provider or what is the name of the office in which the patient attends annual eye exams? N/A If pt is not established with a provider, would they like to be referred to a provider to establish care? No .   Dental Screening: Recommended annual dental exams for proper oral hygiene   Community Resource Referral / Chronic Care Management: CRR required this visit?  No   CCM required this visit?  No     Plan:     I have personally reviewed and noted the following in the patient's chart:   Medical and social history Use of alcohol, tobacco or illicit drugs  Current medications and supplements including opioid prescriptions. Patient is not currently taking opioid prescriptions. Functional ability and status Nutritional status Physical activity Advanced directives List of other physicians Hospitalizations, surgeries, and ER  visits in previous 12 months Vitals Screenings to include cognitive, depression, and falls Referrals and appointments  In addition, I have reviewed and discussed with patient certain preventive protocols, quality metrics, and best practice recommendations. A written personalized care plan for preventive services as well as general preventive health recommendations were provided to patient.     Kiara Holt L Lakiya Cottam, CMA   04/29/2023   After Visit Summary: (MyChart) Due to this being a telephonic visit, the after visit summary with patients personalized plan was offered to patient via MyChart   Nurse Notes: Patient is due for all vaccines, however she declines.  She is up to date on all other health maintenance screenings.  She had no other concerns to address today.

## 2023-08-13 IMAGING — MG MM DIGITAL DIAGNOSTIC UNILAT*R* W/ TOMO W/ CAD
6 series · 6 of 18 positions shown · non-contrast
Comparison: Previous exam(s).

CLINICAL DATA: Recall from screening mammography, possible
asymmetry in the central RIGHT breast visible only on the CC view.

EXAM:
DIGITAL DIAGNOSTIC UNILATERAL RIGHT MAMMOGRAM WITH TOMOSYNTHESIS AND
CAD
TECHNIQUE: Right digital diagnostic mammography and breast tomosynthesis was
performed. The images were evaluated with computer-aided detection.

[R CC synth-2D (1 of 2)]
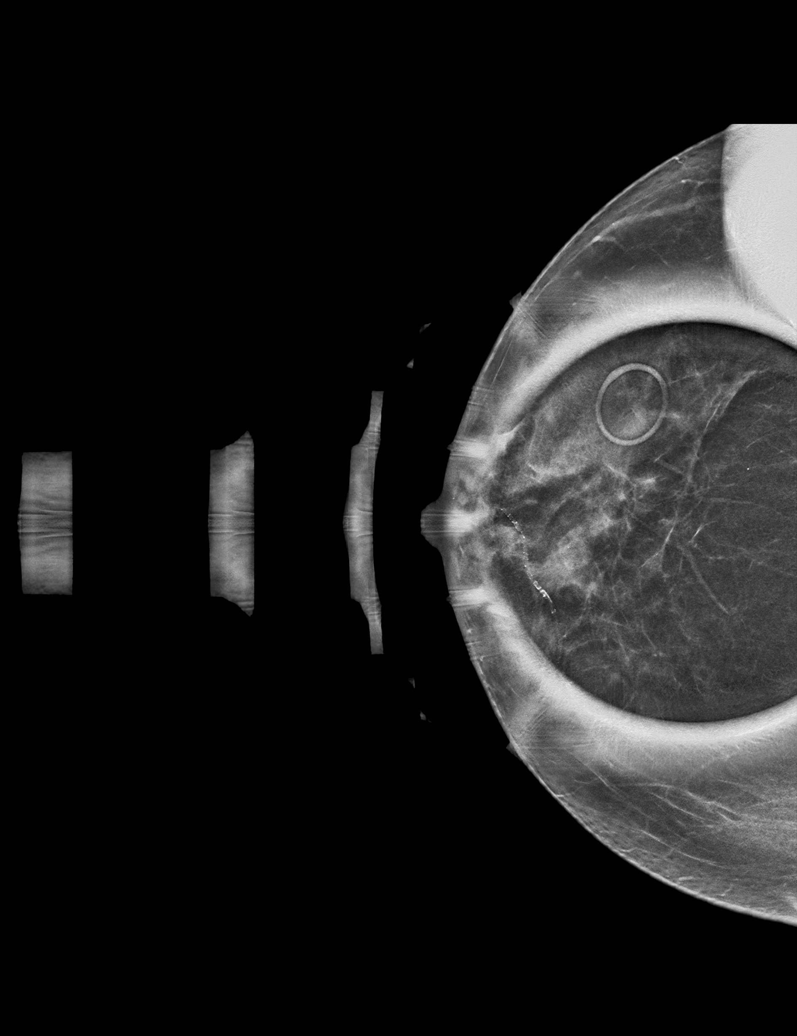

[R CC synth-2D (2 of 2)]
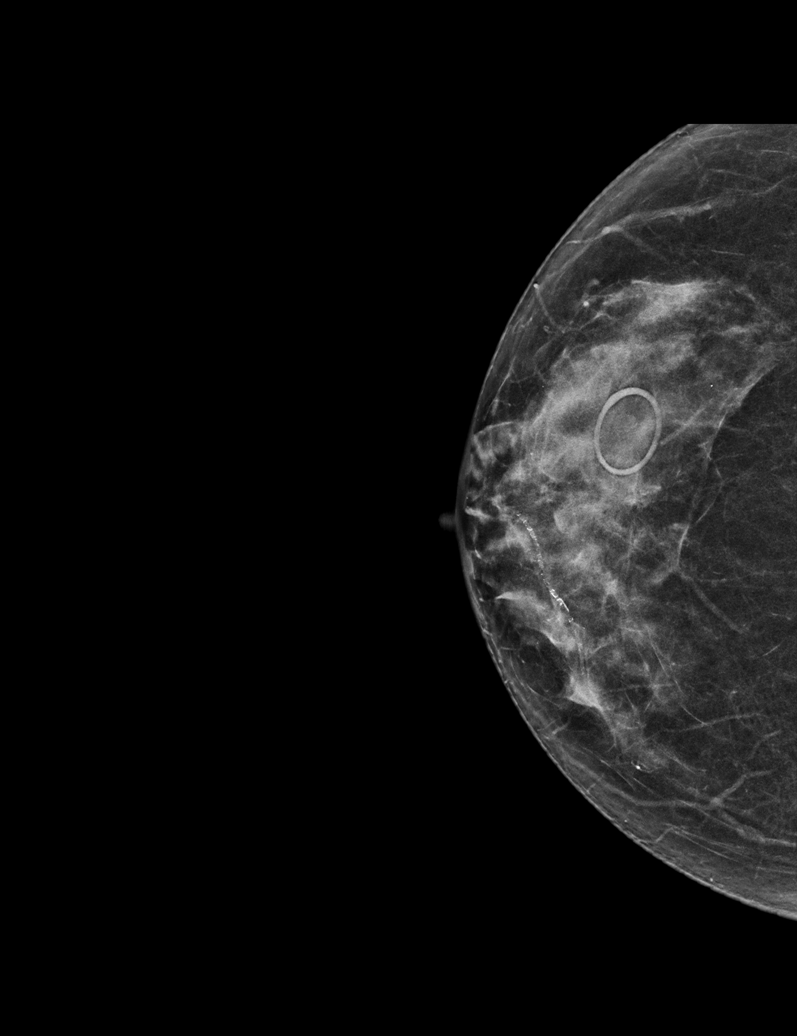

[R ML synth-2D]
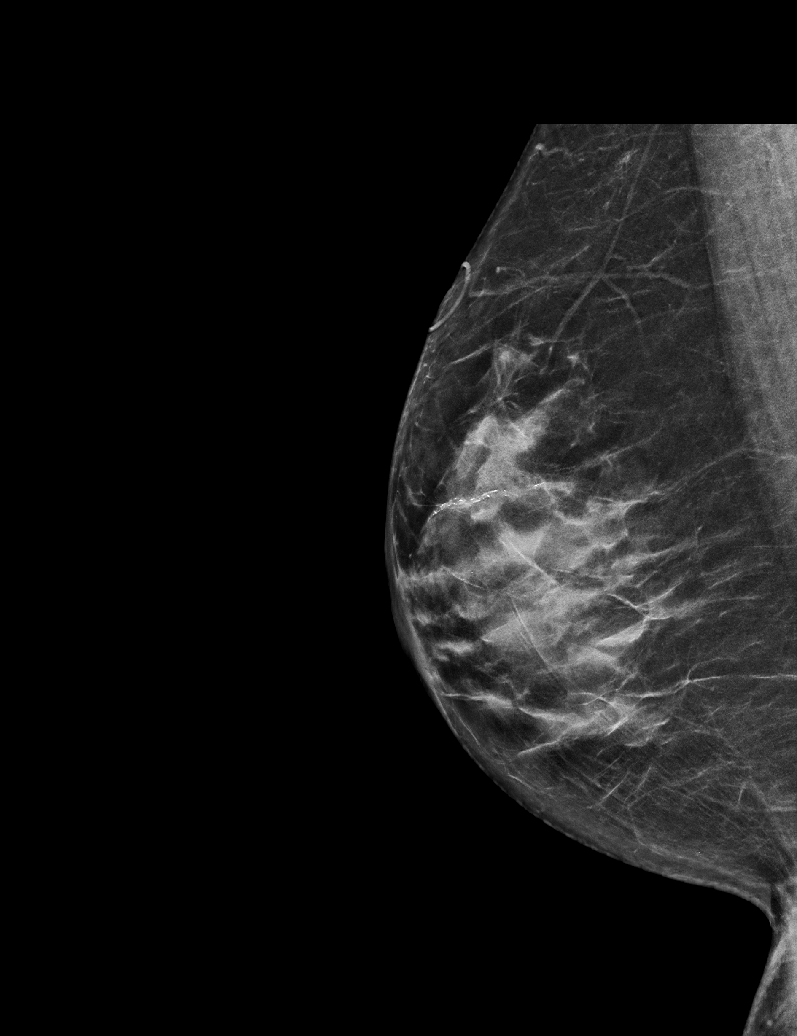

[R CC tomo (1 of 2) · tomo slice 27/54.0]
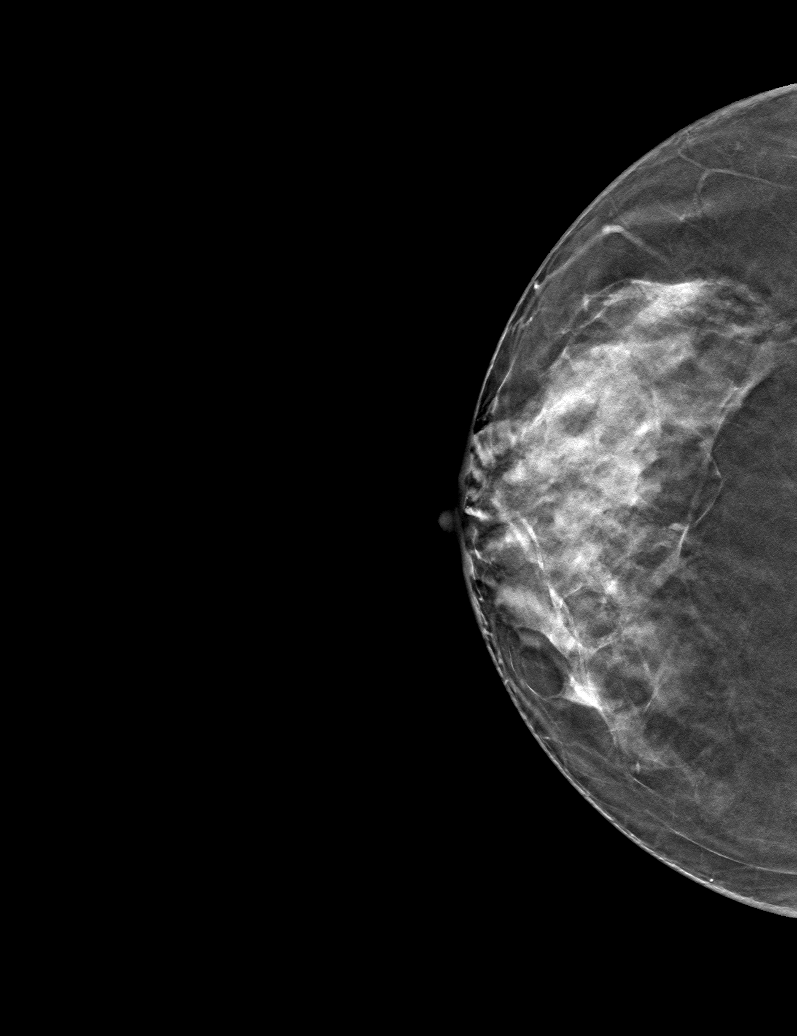

[R ML tomo · tomo slice 33/65.0]
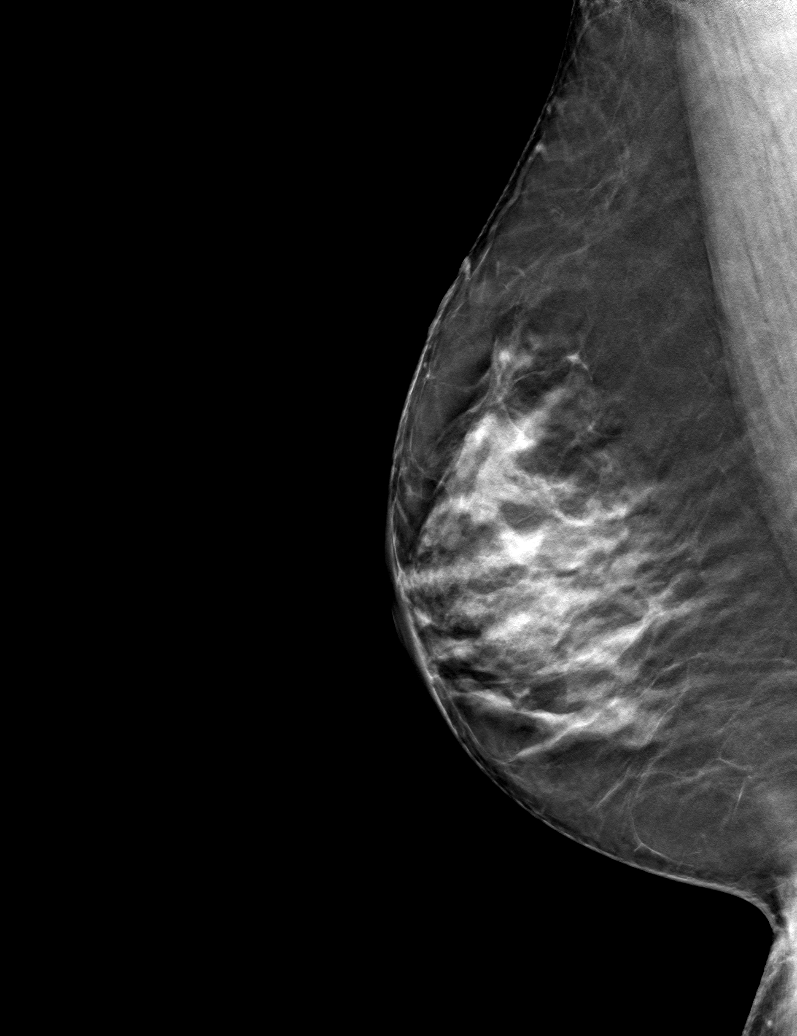

[R CC tomo (2 of 2) · tomo slice 27/52.0]
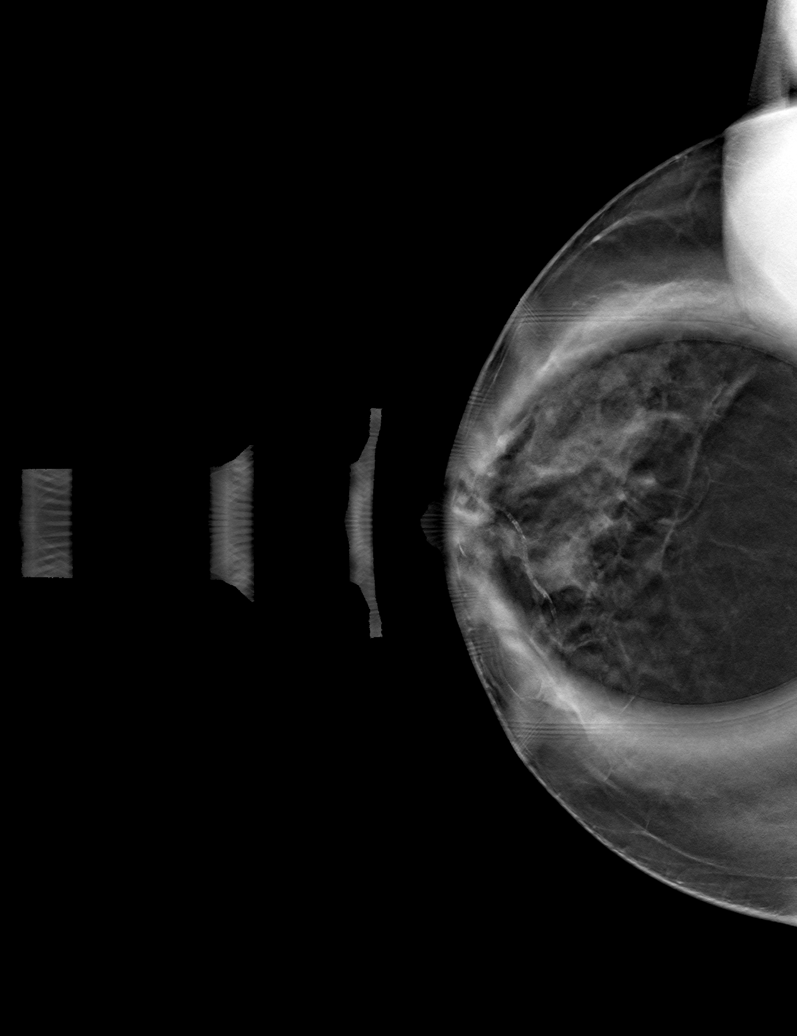

[6 of 18 positions shown; findings below may reference images not displayed]

ACR Breast Density Category c: The breast tissue is heterogeneously
dense, which may obscure small masses.
FINDINGS: Spot-compression CC view of the area of concern and full field
mediolateral and medially rolled CC views were obtained.

The asymmetry in the central breast questioned on screening
mammography partially disperses with compression, indicating
overlapping fibroglandular tissue and Cooper's ligaments. There is
no underlying mass or architectural distortion. This is confirmed on
the medially rolled CC view and the full field mediolateral view.

No findings suspicious for malignancy.
IMPRESSION: No mammographic evidence of malignancy involving the RIGHT breast.

RECOMMENDATION:
Screening mammogram in one year.(Code:[id number]-3JI)

I have discussed the findings and recommendations with the patient.
If applicable, a reminder letter will be sent to the patient
regarding the next appointment.

BI-RADS CATEGORY  1: Negative.

## 2023-08-14 ENCOUNTER — Other Ambulatory Visit: Payer: Self-pay | Admitting: Internal Medicine

## 2023-08-14 DIAGNOSIS — I10 Essential (primary) hypertension: Secondary | ICD-10-CM

## 2023-08-28 ENCOUNTER — Other Ambulatory Visit: Payer: Self-pay | Admitting: Internal Medicine

## 2023-08-28 ENCOUNTER — Ambulatory Visit: Payer: Self-pay | Admitting: Internal Medicine

## 2023-08-28 DIAGNOSIS — I10 Essential (primary) hypertension: Secondary | ICD-10-CM

## 2023-08-28 NOTE — Telephone Encounter (Signed)
Copied from CRM [id number]. Topic: Clinical - Medication Refill >> Aug 28, 2023  9:59 AM Almira Coaster wrote: Most Recent Primary Care Visit:  Provider: Wyvonne Lenz  Department: [facility] [place]  Visit Type: MEDICARE AWV, SEQUENTIAL  Date: 04/29/2023  Medication: amLODipine (NORVASC) 5 MG tablet  Has the patient contacted their pharmacy? Yes, they have no refills (Agent: If no, request that the patient contact the pharmacy for the refill. If patient does not wish to contact the pharmacy document the reason why and proceed with request.) (Agent: If yes, when and what did the pharmacy advise?)  Is this the correct pharmacy for this prescription? Yes If no, delete pharmacy and type the correct one.  This is the patient's preferred pharmacy:  [facility] [id number] - [place], [place] - [address] AT [facility] [address] [place] [address] Phone: [phone] Fax: [phone]   Has the prescription been filled recently? No  Is the patient out of the medication? No, she has enough for 2 to 3 days.  Has the patient been seen for an appointment in the last year OR does the patient have an upcoming appointment? Yes  Can we respond through MyChart? No  Agent: Please be advised that Rx refills may take up to 3 business days. We ask that you follow-up with your pharmacy.

## 2023-09-05 ENCOUNTER — Other Ambulatory Visit: Payer: Self-pay | Admitting: Internal Medicine

## 2023-09-05 DIAGNOSIS — Z1231 Encounter for screening mammogram for malignant neoplasm of breast: Secondary | ICD-10-CM

## 2023-09-13 ENCOUNTER — Other Ambulatory Visit: Payer: Self-pay | Admitting: Internal Medicine

## 2023-09-13 DIAGNOSIS — I10 Essential (primary) hypertension: Secondary | ICD-10-CM

## 2023-09-18 ENCOUNTER — Encounter: Payer: Self-pay | Admitting: Internal Medicine

## 2023-09-26 ENCOUNTER — Encounter: Payer: Self-pay | Admitting: Internal Medicine

## 2023-10-04 ENCOUNTER — Other Ambulatory Visit: Payer: Self-pay | Admitting: Internal Medicine

## 2023-10-04 DIAGNOSIS — I10 Essential (primary) hypertension: Secondary | ICD-10-CM

## 2023-10-04 NOTE — Telephone Encounter (Signed)
 Copied from CRM [id number]. Topic: Clinical - Medication Refill >> Oct 04, 2023  7:54 AM [name] wrote: Most Recent Primary Care Visit:  Provider: Wyvonne Lenz  Department: [facility]  Visit Type: MEDICARE AWV, SEQUENTIAL  Date: 04/29/2023  Medication: amLODipine (NORVASC) 5 MG tablet triamterene-hydrochlorothiazide (DYAZIDE) 37.5-25 MG capsule  Has the patient contacted their pharmacy? Yes, out of re-fills. (Agent: If no, request that the patient contact the pharmacy for the refill. If patient does not wish to contact the pharmacy document the reason why and proceed with request.) (Agent: If yes, when and what did the pharmacy advise?)  Is this the correct pharmacy for this prescription? Yes If no, delete pharmacy and type the correct one.  This is the patient's preferred pharmacy:  [facility] [phone] - [place], [place] - [address] AT [facility] [address] [place] [address] Phone: [phone] Fax: [phone]   Has the prescription been filled recently? Yes  Is the patient out of the medication? Yes  Has the patient been seen for an appointment in the last year OR does the patient have an upcoming appointment? Yes  Can we respond through MyChart? Yes  Agent: Please be advised that Rx refills may take up to 3 business days. We ask that you follow-up with your pharmacy.

## 2023-10-14 ENCOUNTER — Other Ambulatory Visit: Payer: Self-pay | Admitting: Internal Medicine

## 2023-10-14 DIAGNOSIS — I10 Essential (primary) hypertension: Secondary | ICD-10-CM

## 2023-10-14 NOTE — Telephone Encounter (Signed)
 Copied from CRM [id number]. Topic: Clinical - Medication Refill >> Oct 14, 2023  8:20 AM Elizebeth Brooking wrote: Most Recent Primary Care Visit:  Provider: Wyvonne Lenz  Department: [facility]  Visit Type: MEDICARE AWV, SEQUENTIAL  Date: 04/29/2023  Medication: amLODipine (NORVASC) 5 MG tablet  triamterene-hydrochlorothiazide (DYAZIDE) 37.5-25 MG capsule  Has the patient contacted their pharmacy? Yes (Agent: If no, request that the patient contact the pharmacy for the refill. If patient does not wish to contact the pharmacy document the reason why and proceed with request.) (Agent: If yes, when and what did the pharmacy advise?)  Is this the correct pharmacy for this prescription? Yes If no, delete pharmacy and type the correct one.  This is the patient's preferred pharmacy:  [facility] [phone] - [place], [place] - [address] AT [facility] [address] [place] [address] Phone: [phone] Fax: [phone]   Has the prescription been filled recently? No  Is the patient out of the medication? Yes  Has the patient been seen for an appointment in the last year OR does the patient have an upcoming appointment? Yes  Can we respond through MyChart? Yes  Agent: Please be advised that Rx refills may take up to 3 business days. We ask that you follow-up with your pharmacy.

## 2023-10-14 NOTE — Telephone Encounter (Signed)
 Office Visit scheduled for November 14, 2023 at 10:20 am for medication refill. Please call patient if there are appointments that are closer for her refill.

## 2023-10-15 MED ORDER — AMLODIPINE BESYLATE 5 MG PO TABS
5.0000 mg | ORAL_TABLET | Freq: Every day | ORAL | 0 refills | Status: DC
Start: 2023-10-15 — End: 2023-11-12

## 2023-10-15 MED ORDER — TRIAMTERENE-HCTZ 37.5-25 MG PO CAPS
1.0000 | ORAL_CAPSULE | Freq: Every day | ORAL | 0 refills | Status: DC
Start: 2023-10-15 — End: 2023-11-12

## 2023-10-23 ENCOUNTER — Ambulatory Visit
Admission: RE | Admit: 2023-10-23 | Discharge: 2023-10-23 | Disposition: A | Discharge status: Home / Self Care | Payer: Medicare Other | Source: Ambulatory Visit | Attending: Internal Medicine | Admitting: Internal Medicine

## 2023-10-23 DIAGNOSIS — Z1231 Encounter for screening mammogram for malignant neoplasm of breast: Secondary | ICD-10-CM

## 2023-10-24 ENCOUNTER — Ambulatory Visit (AMBULATORY_SURGERY_CENTER): Payer: Medicare Other

## 2023-10-24 VITALS — Ht 65.5 in | Wt 173.0 lb

## 2023-10-24 DIAGNOSIS — Z1211 Encounter for screening for malignant neoplasm of colon: Secondary | ICD-10-CM

## 2023-10-24 MED ORDER — SUFLAVE 178.7 G PO SOLR
1.0000 | Freq: Once | ORAL | 0 refills | Status: AC
Start: 2023-10-24 — End: 2023-10-24

## 2023-10-24 NOTE — Progress Notes (Signed)

## 2023-11-11 ENCOUNTER — Observation Stay (HOSPITAL_COMMUNITY)
Admission: EM | Admit: 2023-11-11 | Discharge: 2023-11-12 | Disposition: A | Discharge status: Home / Self Care | Attending: Internal Medicine | Admitting: Internal Medicine

## 2023-11-11 ENCOUNTER — Encounter: Payer: Self-pay | Admitting: Internal Medicine

## 2023-11-11 ENCOUNTER — Encounter: Payer: Medicare Other | Admitting: Internal Medicine

## 2023-11-11 ENCOUNTER — Other Ambulatory Visit: Payer: Self-pay

## 2023-11-11 ENCOUNTER — Ambulatory Visit: Admitting: Internal Medicine

## 2023-11-11 ENCOUNTER — Encounter (HOSPITAL_COMMUNITY): Payer: Self-pay | Admitting: Internal Medicine

## 2023-11-11 ENCOUNTER — Emergency Department (HOSPITAL_COMMUNITY)

## 2023-11-11 ENCOUNTER — Encounter: Admitting: Internal Medicine

## 2023-11-11 VITALS — BP 169/80 | HR 134 | Temp 98.0°F | Resp 17 | Ht 65.5 in | Wt 173.0 lb

## 2023-11-11 DIAGNOSIS — N179 Acute kidney failure, unspecified: Secondary | ICD-10-CM | POA: Diagnosis present

## 2023-11-11 DIAGNOSIS — Z79899 Other long term (current) drug therapy: Secondary | ICD-10-CM | POA: Diagnosis not present

## 2023-11-11 DIAGNOSIS — I10 Essential (primary) hypertension: Secondary | ICD-10-CM | POA: Diagnosis present

## 2023-11-11 DIAGNOSIS — I480 Paroxysmal atrial fibrillation: Secondary | ICD-10-CM | POA: Diagnosis not present

## 2023-11-11 DIAGNOSIS — I499 Cardiac arrhythmia, unspecified: Secondary | ICD-10-CM | POA: Insufficient documentation

## 2023-11-11 DIAGNOSIS — E876 Hypokalemia: Secondary | ICD-10-CM | POA: Diagnosis not present

## 2023-11-11 DIAGNOSIS — I160 Hypertensive urgency: Secondary | ICD-10-CM | POA: Diagnosis not present

## 2023-11-11 DIAGNOSIS — E871 Hypo-osmolality and hyponatremia: Secondary | ICD-10-CM

## 2023-11-11 DIAGNOSIS — R7303 Prediabetes: Secondary | ICD-10-CM | POA: Diagnosis present

## 2023-11-11 DIAGNOSIS — E872 Acidosis, unspecified: Secondary | ICD-10-CM | POA: Insufficient documentation

## 2023-11-11 DIAGNOSIS — R002 Palpitations: Principal | ICD-10-CM | POA: Diagnosis present

## 2023-11-11 DIAGNOSIS — K2100 Gastro-esophageal reflux disease with esophagitis, without bleeding: Secondary | ICD-10-CM | POA: Diagnosis not present

## 2023-11-11 DIAGNOSIS — Z1211 Encounter for screening for malignant neoplasm of colon: Secondary | ICD-10-CM

## 2023-11-11 DIAGNOSIS — E782 Mixed hyperlipidemia: Secondary | ICD-10-CM | POA: Diagnosis not present

## 2023-11-11 DIAGNOSIS — Z7901 Long term (current) use of anticoagulants: Secondary | ICD-10-CM

## 2023-11-11 DIAGNOSIS — I4891 Unspecified atrial fibrillation: Principal | ICD-10-CM

## 2023-11-11 DIAGNOSIS — E8729 Other acidosis: Secondary | ICD-10-CM | POA: Diagnosis present

## 2023-11-11 LAB — D-DIMER, QUANTITATIVE: D-Dimer, Quant: 0.41 ug{FEU}/mL (ref 0.00–0.50)

## 2023-11-11 LAB — CBC WITH DIFFERENTIAL/PLATELET
Abs Immature Granulocytes: 0.02 10*3/uL (ref 0.00–0.07)
Basophils Absolute: 0 10*3/uL (ref 0.0–0.1)
Basophils Relative: 0 %
Eosinophils Absolute: 0 10*3/uL (ref 0.0–0.5)
Eosinophils Relative: 0 %
HCT: 40.4 % (ref 36.0–46.0)
Hemoglobin: 13.3 g/dL (ref 12.0–15.0)
Immature Granulocytes: 0 %
Lymphocytes Relative: 13 %
Lymphs Abs: 1 10*3/uL (ref 0.7–4.0)
MCH: 27 pg (ref 26.0–34.0)
MCHC: 32.9 g/dL (ref 30.0–36.0)
MCV: 82.1 fL (ref 80.0–100.0)
Monocytes Absolute: 1 10*3/uL (ref 0.1–1.0)
Monocytes Relative: 12 %
Neutro Abs: 5.9 10*3/uL (ref 1.7–7.7)
Neutrophils Relative %: 75 %
Platelets: 287 10*3/uL (ref 150–400)
RBC: 4.92 MIL/uL (ref 3.87–5.11)
RDW: 14.3 % (ref 11.5–15.5)
WBC: 7.9 10*3/uL (ref 4.0–10.5)
nRBC: 0 % (ref 0.0–0.2)

## 2023-11-11 LAB — COMPREHENSIVE METABOLIC PANEL WITH GFR
ALT: 23 U/L (ref 0–44)
AST: 32 U/L (ref 15–41)
Albumin: 3.9 g/dL (ref 3.5–5.0)
Alkaline Phosphatase: 63 U/L (ref 38–126)
Anion gap: 18 — ABNORMAL HIGH (ref 5–15)
BUN: 12 mg/dL (ref 8–23)
CO2: 24 mmol/L (ref 22–32)
Calcium: 9.4 mg/dL (ref 8.9–10.3)
Chloride: 96 mmol/L — ABNORMAL LOW (ref 98–111)
Creatinine, Ser: 1.01 mg/dL — ABNORMAL HIGH (ref 0.44–1.00)
GFR, Estimated: 60 mL/min — ABNORMAL LOW (ref >60)
Glucose, Bld: 134 mg/dL — ABNORMAL HIGH (ref 70–99)
Potassium: 2.7 mmol/L — CL (ref 3.5–5.1)
Sodium: 138 mmol/L (ref 135–145)
Total Bilirubin: 0.9 mg/dL (ref 0.0–1.2)
Total Protein: 7.8 g/dL (ref 6.5–8.1)

## 2023-11-11 LAB — HEMOGLOBIN A1C
Hgb A1c MFr Bld: 5.8 % — ABNORMAL HIGH (ref 4.8–5.6)
Mean Plasma Glucose: 119.76 mg/dL

## 2023-11-11 LAB — ETHANOL: Alcohol, Ethyl (B): <10 mg/dL (ref <10)

## 2023-11-11 LAB — TROPONIN I (HIGH SENSITIVITY)
Troponin I (High Sensitivity): 26 ng/L — ABNORMAL HIGH (ref <18)
Troponin I (High Sensitivity): 36 ng/L — ABNORMAL HIGH (ref <18)
Troponin I (High Sensitivity): 35 ng/L — ABNORMAL HIGH (ref <18)

## 2023-11-11 LAB — HIV ANTIBODY (ROUTINE TESTING W REFLEX): HIV Screen 4th Generation wRfx: NONREACTIVE

## 2023-11-11 LAB — SALICYLATE LEVEL: Salicylate Lvl: <7 mg/dL — ABNORMAL LOW (ref 7.0–30.0)

## 2023-11-11 LAB — BETA-HYDROXYBUTYRIC ACID: Beta-Hydroxybutyric Acid: 0.72 mmol/L — ABNORMAL HIGH (ref 0.05–0.27)

## 2023-11-11 LAB — T4, FREE: Free T4: 1.09 ng/dL (ref 0.61–1.12)

## 2023-11-11 LAB — MAGNESIUM: Magnesium: 1.8 mg/dL (ref 1.7–2.4)

## 2023-11-11 LAB — TSH: TSH: 2.273 u[IU]/mL (ref 0.350–4.500)

## 2023-11-11 MED ORDER — EZETIMIBE 10 MG PO TABS
10.0000 mg | ORAL_TABLET | Freq: Every day | ORAL | Status: DC
  Administered 2023-11-11 – 2023-11-12 (×2): 10 mg via ORAL
  Filled 2023-11-11 (×2): qty 1

## 2023-11-11 MED ORDER — ACETAMINOPHEN 325 MG PO TABS: 650.0000 mg | ORAL_TABLET | Freq: Four times a day (QID) | ORAL | Status: DC | PRN

## 2023-11-11 MED ORDER — POTASSIUM CHLORIDE 10 MEQ/100ML IV SOLN
10.0000 meq | Freq: Once | INTRAVENOUS | Status: AC
  Administered 2023-11-11: 10 meq via INTRAVENOUS
  Filled 2023-11-11: qty 100

## 2023-11-11 MED ORDER — ACETAMINOPHEN 650 MG RE SUPP: 650.0000 mg | Freq: Four times a day (QID) | RECTAL | Status: DC | PRN

## 2023-11-11 MED ORDER — DILTIAZEM LOAD VIA INFUSION
20.0000 mg | Freq: Once | INTRAVENOUS | Status: AC
  Administered 2023-11-11: 20 mg via INTRAVENOUS
  Filled 2023-11-11: qty 20

## 2023-11-11 MED ORDER — ONDANSETRON HCL 4 MG PO TABS: 4.0000 mg | ORAL_TABLET | Freq: Four times a day (QID) | ORAL | Status: DC | PRN

## 2023-11-11 MED ORDER — DILTIAZEM HCL-DEXTROSE 125-5 MG/125ML-% IV SOLN (PREMIX)
5.0000 mg/h | INTRAVENOUS | Status: DC
  Administered 2023-11-11 (×2): 5 mg/h via INTRAVENOUS
  Filled 2023-11-11 (×4): qty 125

## 2023-11-11 MED ORDER — ONDANSETRON HCL 4 MG/2ML IJ SOLN: 4.0000 mg | Freq: Four times a day (QID) | INTRAMUSCULAR | Status: DC | PRN

## 2023-11-11 MED ORDER — DILTIAZEM HCL 60 MG PO TABS
60.0000 mg | ORAL_TABLET | Freq: Three times a day (TID) | ORAL | Status: DC
  Administered 2023-11-12 (×2): 60 mg via ORAL
  Filled 2023-11-11 (×6): qty 1

## 2023-11-11 MED ORDER — PANTOPRAZOLE SODIUM 40 MG IV SOLR
40.0000 mg | INTRAVENOUS | Status: DC
  Administered 2023-11-11 – 2023-11-12 (×2): 40 mg via INTRAVENOUS
  Filled 2023-11-11 (×2): qty 10

## 2023-11-11 MED ORDER — APIXABAN 5 MG PO TABS
5.0000 mg | ORAL_TABLET | Freq: Two times a day (BID) | ORAL | Status: DC
  Administered 2023-11-11 – 2023-11-12 (×2): 5 mg via ORAL
  Filled 2023-11-11 (×2): qty 1

## 2023-11-11 MED ORDER — SODIUM CHLORIDE 0.9 % IV SOLN
500.0000 mL | Freq: Once | INTRAVENOUS | Status: DC
Start: 2023-11-11 — End: 2023-11-12

## 2023-11-11 MED ORDER — LOSARTAN POTASSIUM 50 MG PO TABS
50.0000 mg | ORAL_TABLET | Freq: Every day | ORAL | Status: DC
  Administered 2023-11-11 – 2023-11-12 (×2): 50 mg via ORAL
  Filled 2023-11-11 (×2): qty 1

## 2023-11-11 NOTE — Assessment & Plan Note (Signed)
 Cont diltiazem and PO conversion per protocol. Eliquis.

## 2023-11-11 NOTE — ED Provider Notes (Signed)
 [facility] AT [facility] Provider Note   CSN: [id number] Arrival date & time: 11/11/23  4098     History  Chief Complaint  Patient presents with   Irregular Heart Beat    Kiara Holt is a 73 y.o. female.  HPI   This patient is a 73 year old female treated for hypertension with hydrochlorothiazide, triamterene and amlodipine.  Denies history of diabetes or cardiac arrhythmias or any cardiac problems.  States that she was in her usual state of health when she went for her screening colonoscopy and was found to be tachycardic.  I have spoken with the gastroenterologist Dr. Faustino Congress who reports that the patient was as high as 180 bpm and slightly hypertensive.  The patient was asymptomatic throughout this and has no idea when this started.  She reports that she does go to the gym and she has not been having any issues with her exercise recently.  She does note that her heart rate seems to give her palpitations when she goes to the doctor but that is not a new phenomenon.  No unexpected weight loss, she recently did take her GoLytely in preparation for the colonoscopy but no other new medications, she does not take anything over-the-counter, she has not had any recent upper respiratory symptoms and denies alcohol use or other stimulant use.  She is not anticoagulated.  Denies syncope, shortness of breath, swelling of the legs or any other symptoms  Home Medications Prior to Admission medications   Medication Sig Start Date End Date Taking? Authorizing Provider  amLODipine (NORVASC) 5 MG tablet Take 1 tablet (5 mg total) by mouth daily. 10/15/23   Etta Grandchild, MD  rosuvastatin (CRESTOR) 10 MG tablet Take 1 tablet (10 mg total) by mouth daily. Patient not taking: Reported on 11/11/2023 11/14/22   Etta Grandchild, MD  triamterene-hydrochlorothiazide (DYAZIDE) 37.5-25 MG capsule Take 1 each (1 capsule total) by mouth daily. 10/15/23   Etta Grandchild, MD       Allergies    Patient has no known allergies.    Review of Systems   Review of Systems  All other systems reviewed and are negative.   Physical Exam Updated Vital Signs BP (!) 147/79   Pulse (!) 108   Temp 98 F (36.7 C) (Oral)   Resp (!) 23   Ht 1.664 m (5' 5.5")   Wt 78.5 kg   SpO2 98%   BMI 28.35 kg/m  Physical Exam Vitals and nursing note reviewed.  Constitutional:      General: She is not in acute distress.    Appearance: She is well-developed.  HENT:     Head: Normocephalic and atraumatic.     Mouth/Throat:     Pharynx: No oropharyngeal exudate.  Eyes:     General: No scleral icterus.       Right eye: No discharge.        Left eye: No discharge.     Conjunctiva/sclera: Conjunctivae normal.     Pupils: Pupils are equal, round, and reactive to light.  Neck:     Thyroid: No thyromegaly.     Vascular: No JVD.  Cardiovascular:     Rate and Rhythm: Tachycardia present. Rhythm irregular.     Heart sounds: Normal heart sounds. No murmur heard.    No friction rub. No gallop.  Pulmonary:     Effort: Pulmonary effort is normal. No respiratory distress.     Breath sounds: Normal breath sounds. No  wheezing or rales.  Abdominal:     General: Bowel sounds are normal. There is no distension.     Palpations: Abdomen is soft. There is no mass.     Tenderness: There is no abdominal tenderness.  Musculoskeletal:        General: No tenderness. Normal range of motion.     Cervical back: Normal range of motion and neck supple.     Right lower leg: No edema.     Left lower leg: No edema.  Lymphadenopathy:     Cervical: No cervical adenopathy.  Skin:    General: Skin is warm and dry.     Findings: No erythema or rash.  Neurological:     Mental Status: She is alert.     Coordination: Coordination normal.  Psychiatric:        Behavior: Behavior normal.     ED Results / Procedures / Treatments   Labs (all labs ordered are listed, but only abnormal results are  displayed) Labs Reviewed  COMPREHENSIVE METABOLIC PANEL WITH GFR - Abnormal; Notable for the following components:      Result Value   Potassium 2.7 (*)    Chloride 96 (*)    Glucose, Bld 134 (*)    Creatinine, Ser 1.01 (*)    GFR, Estimated 60 (*)    Anion gap 18 (*)    All other components within normal limits  TROPONIN I (HIGH SENSITIVITY) - Abnormal; Notable for the following components:   Troponin I (High Sensitivity) 26 (*)    All other components within normal limits  TSH  CBC WITH DIFFERENTIAL/PLATELET  MAGNESIUM    EKG EKG Interpretation Date/Time:  Monday November 11 2023 12:01:17 EDT Ventricular Rate:  118 PR Interval:  43 QRS Duration:  76 QT Interval:  283 QTC Calculation: 397 R Axis:   39  Text Interpretation: Atrial fibrillation Repol abnrm suggests ischemia, diffuse leads Since last tracing rate slower Confirmed by Eber Hong ([id number]) on 11/11/2023 12:08:57 PM  Radiology DG Chest Port 1 View Result Date: 11/11/2023 CLINICAL DATA:  A rhythm E a EXAM: PORTABLE CHEST 1 VIEW COMPARISON:  Chest radiograph dated 03/17/2018 FINDINGS: Patient is rotated to the right. Normal lung volumes. No focal consolidations. No pleural effusion or pneumothorax. The heart size and mediastinal contours are within normal limits. No acute osseous abnormality. IMPRESSION: No acute disease. Electronically Signed   By: Agustin Cree M.D.   On: 11/11/2023 11:06    Procedures .Critical Care  Performed by: Eber Hong, MD Authorized by: Eber Hong, MD   Critical care provider statement:    Critical care time (minutes):  45   Critical care time was exclusive of:  Separately billable procedures and treating other patients and teaching time   Critical care was necessary to treat or prevent imminent or life-threatening deterioration of the following conditions:  Cardiac failure   Critical care was time spent personally by me on the following activities:  Development of treatment plan with  patient or surrogate, discussions with consultants, evaluation of patient's response to treatment, examination of patient, obtaining history from patient or surrogate, review of old charts, re-evaluation of patient's condition, pulse oximetry, ordering and review of radiographic studies, ordering and review of laboratory studies and ordering and performing treatments and interventions   I assumed direction of critical care for this patient from another provider in my specialty: no     Care discussed with: admitting provider   Comments:  Medications Ordered in ED Medications  diltiazem (CARDIZEM) 1 mg/mL load via infusion 20 mg (20 mg Intravenous Bolus from Bag 11/11/23 1119)    And  diltiazem (CARDIZEM) 125 mg in dextrose 5% 125 mL (1 mg/mL) infusion (10 mg/hr Intravenous Infusion Verify 11/11/23 1235)  potassium chloride 10 mEq in 100 mL IVPB (has no administration in time range)  potassium chloride 10 mEq in 100 mL IVPB (has no administration in time range)    ED Course/ Medical Decision Making/ A&P                                 Medical Decision Making Amount and/or Complexity of Data Reviewed Labs: ordered. Radiology: ordered.  Risk Prescription drug management. Decision regarding hospitalization.    This patient presents to the ED for concern of arrhythmia, this involves an extensive number of treatment options, and is a complaint that carries with it a high risk of complications and morbidity.  The differential diagnosis includes new onset A-fib, this could be related to relative dehydration or hypokalemia after drinking the contrast, unfortunately the patient does not know when it started so cardioversion is not an option that she could be hypercoagulable.   Co morbidities that complicate the patient evaluation  Hypertension   Additional history obtained:  Additional history obtained from medical record External records from outside source obtained and  reviewed including no recent admissions to the hospital, follows up in the office, has been diagnosed with premature atrial contractions in the past but no other formal rhythm abnormalities and has not seen cardiology going back about 12 years in the medical record   Lab Tests:  I Ordered, and personally interpreted labs.  The pertinent results include: Significant hypokalemia   Imaging Studies ordered:  I ordered imaging studies including x-ray I independently visualized and interpreted imaging which showed no acute findings I agree with the radiologist interpretation   Cardiac Monitoring: / EKG:  The patient was maintained on a cardiac monitor.  I personally viewed and interpreted the cardiac monitored which showed an underlying rhythm of: Arrhythmia, appears to be atrial fibrillation with rapid ventricular rate however there are times where there appears to be a short run of sinus rhythm or a short run of SVT, primarily in atrial fibrillation.  Rhythm strips from prehospital and colonoscopy center also reviewed and seem to be consistent with atrial fibrillation   Problem List / ED Course / Critical interventions / Medication management  Patient maintained A-fib, required Cardizem of the drip I ordered medication including diltiazem with a drip as well as hypokalemia replacement with IV potassium for A-fib with RVR Reevaluation of the patient after these medicines showed that the patient improved but maintained an ongoing mild tachycardia I have reviewed the patients home medicines and have made adjustments as needed   Consultations Obtained:  I requested consultation with the hospitalist,  and discussed lab and imaging findings as well as pertinent plan - they recommend: Admission I did discuss the care with the cardiology team and they will come to see the patient as well for make the recommendations   Social Determinants of Health:  New onset A-fib   Test / Admission -  Considered:  Admit to higher level of care         Final Clinical Impression(s) / ED Diagnoses Final diagnoses:  Atrial fibrillation with rapid ventricular response (HCC)  Hypokalemia    Rx / DC  Orders ED Discharge Orders     None         Eber Hong, MD 11/11/23 [phone]

## 2023-11-11 NOTE — ED Notes (Signed)
 Awaiting diltiazem from main pharmacy.

## 2023-11-11 NOTE — Progress Notes (Signed)
 PHARMACY - ANTICOAGULATION CONSULT NOTE  Pharmacy Consult for Eliquis Indication: atrial fibrillation  No Known Allergies  Patient Measurements: Height: 5' 5.5" (166.4 cm) Weight: 78.5 kg (173 lb) IBW/kg (Calculated) : 58.15 HEPARIN DW (KG): 74.4  Vital Signs: Temp: 97.8 F (36.6 C) (04/07 1349) Temp Source: Oral (04/07 1349) BP: 149/88 (04/07 1345) Pulse Rate: 125 (04/07 1345)  Labs: Recent Labs    11/11/23 1013 11/11/23 1337 11/11/23 1632  HGB 13.3  --   --   HCT 40.4  --   --   PLT 287  --   --   CREATININE 1.01*  --   --   TROPONINIHS 26* 36* 35*    Estimated Creatinine Clearance: 54.2 mL/min (A) (by C-G formula based on SCr of 1.01 mg/dL (H)).   Medical History: Past Medical History:  Diagnosis Date   Arthritis    shoulder   Hyperlipidemia    Hypertension     Assessment: 14 YOF presenting with tachycardia, in afib RVR, CHA2DS2VASc 3, not on anticoagulation PTA  Goal of Therapy:  Monitor platelets by anticoagulation protocol: Yes   Plan:  Eliquis 5mg  PO BID F/u co-pay check in AM Monitor s/s bleeding  Daylene Posey, PharmD, [facility] Clinical Pharmacist ED Pharmacist Phone # [phone] 11/11/2023 6:11 PM

## 2023-11-11 NOTE — ED Notes (Signed)
Patient was given a cup of ice.

## 2023-11-11 NOTE — Progress Notes (Signed)
 Pt in rapid A-fib 120's-170's B/P 190/110's pt denies SOB, headache or chest pain. Will go to recovery and transport to hospital. Case cancelled.tb

## 2023-11-11 NOTE — Consult Note (Addendum)
 Cardiology Consultation   Patient ID: Kiara Holt MRN: [id number]; DOB: Dec 27, 1950  Admit date: 11/11/2023 Date of Consult: 11/11/2023  PCP:  Kiara Grandchild, MD   [facility] Providers Cardiologist:  Tessa Lerner, DO      Patient Profile:   Kiara Holt is a 73 y.o. female with a hx of hypertension, prediabetes, and hyperlipidemia but no prior cardiac history who is being seen 11/11/2023 for the evaluation of hypertensive urgency and tachycardia at the request of Dr. Allena Katz.  History of Present Illness:   [name] has a history of PACs with bigeminy.  She is followed with her PCP for hypertension and hyperlipidemia.  When seen April 2024, her antihypertensive medications were restarted including amlodipine 5 mg daily and triamterene-HCTZ 37.5-25 mg daily.  She was recommended for calcium score, I do not see that this was completed.  She was on 10 mg Crestor.  She presented today for outpatient scheduled colonoscopy.  She was found to be hypertensive with systolic BP 190 and tachycardic with a heart rate of 170.  She did not take her home BP medications this morning.  Colonoscopy was canceled and she was referred to the ER for further workup.  On arrival, EKG appears consistent with A-fib with RVR.  Cardiology was consulted.  K 2.7 Creatinine 1.01 TSH WNL  Magnesium 1.8 Troponin 26-36  Upon entering the room, the patient appears comfortable, sitting inclined. She denies recent SOB or CP. She continues to walk 2 miles three times per week on the treadmill at the gym. No prior cardiac history. She is largely unaware of her rhythm, However, while in the room, RVR increased to the 170s and she felt palpitations and breathing difficulties. Following this, she converted to sinus tachycardia without a post conversion pause, but PACs, in the low 100s.    Past Medical History:  Diagnosis Date   Arthritis    shoulder   Hyperlipidemia    Hypertension     Past Surgical  History:  Procedure Laterality Date   ABDOMINAL HYSTERECTOMY  1972   COLONOSCOPY       Home Medications:  Prior to Admission medications   Medication Sig Start Date End Date Taking? Authorizing Provider  amLODipine (NORVASC) 5 MG tablet Take 1 tablet (5 mg total) by mouth daily. 10/15/23  Yes Kiara Grandchild, MD  triamterene-hydrochlorothiazide (DYAZIDE) 37.5-25 MG capsule Take 1 each (1 capsule total) by mouth daily. 10/15/23  Yes Kiara Grandchild, MD    Inpatient Medications: Scheduled Meds:  pantoprazole (PROTONIX) IV  40 mg Intravenous Q24H   Continuous Infusions:  sodium chloride     diltiazem (CARDIZEM) infusion 15 mg/hr (11/11/23 1434)   PRN Meds: acetaminophen **OR** acetaminophen, ondansetron **OR** ondansetron (ZOFRAN) IV  Allergies:   No Known Allergies  Social History:   Social History   Socioeconomic History   Marital status: Married    Spouse name: Nadine Counts   Number of children: Not on file   Years of education: Not on file   Highest education level: Not on file  Occupational History   Occupation: Retired  Tobacco Use   Smoking status: Never   Smokeless tobacco: Never  Vaping Use   Vaping status: Never Used  Substance and Sexual Activity   Alcohol use: No   Drug use: No   Sexual activity: Never  Other Topics Concern   Not on file  Social History Narrative   Lives with husband.   Social Drivers of Health  Financial Resource Strain: Low Risk  (04/29/2023)   Overall Financial Resource Strain (CARDIA)    Difficulty of Paying Living Expenses: Not hard at all  Food Insecurity: No Food Insecurity (11/11/2023)   Hunger Vital Sign    Worried About Running Out of Food in the Last Year: Never true    Ran Out of Food in the Last Year: Never true  Transportation Needs: No Transportation Needs (11/11/2023)   PRAPARE - Administrator, Civil Service (Medical): No    Lack of Transportation (Non-Medical): No  Physical Activity: Sufficiently Active  (04/29/2023)   Exercise Vital Sign    Days of Exercise per Week: 3 days    Minutes of Exercise per Session: 60 min  Stress: No Stress Concern Present (04/29/2023)   [employer] of Occupational Health - Occupational Stress Questionnaire    Feeling of Stress : Not at all  Social Connections: Moderately Integrated (11/11/2023)   Social Connection and Isolation Panel [NHANES]    Frequency of Communication with Friends and Family: More than three times a week    Frequency of Social Gatherings with Friends and Family: More than three times a week    Attends Religious Services: More than 4 times per year    Active Member of [employer] or Organizations: No    Attends Banker Meetings: Never    Marital Status: Married  Catering manager Violence: Not At Risk (11/11/2023)   Humiliation, Afraid, Rape, and Kick questionnaire    Fear of Current or Ex-Partner: No    Emotionally Abused: No    Physically Abused: No    Sexually Abused: No    Family History:    Family History  Problem Relation Age of Onset   Kidney disease Mother    Arthritis Mother    Diabetes Mother    Breast cancer Maternal Grandmother    Cancer Neg Hx    Heart disease Neg Hx    Hyperlipidemia Neg Hx    Hypertension Neg Hx    Stroke Neg Hx    Colon cancer Neg Hx    Colon polyps Neg Hx    Rectal cancer Neg Hx    Stomach cancer Neg Hx      ROS:  Please see the history of present illness.   All other ROS reviewed and negative.     Physical Exam/Data:   Vitals:   11/11/23 1230 11/11/23 1245 11/11/23 1345 11/11/23 1349  BP: (!) 147/79 (!) 147/71 (!) 149/88   Pulse: (!) 108 (!) 104 (!) 125   Resp: (!) 23 18 16    Temp:    97.8 F (36.6 C)  TempSrc:    Oral  SpO2: 98% 98% 97%   Weight:      Height:        Intake/Output Summary (Last 24 hours) at 11/11/2023 1624 Last data filed at 11/11/2023 1235 Gross per 24 hour  Intake 27.82 ml  Output --  Net 27.82 ml      11/11/2023   10:04 AM 11/11/2023    8:05 AM  10/24/2023   10:30 AM  Last 3 Weights  Weight (lbs) 173 lb 173 lb 173 lb  Weight (kg) 78.472 kg 78.472 kg 78.472 kg     Body mass index is 28.35 kg/m.  General:  Well nourished, well developed, in no acute distress HEENT: normal Neck: no JVD Vascular: No carotid bruits; Distal pulses 2+ bilaterally Cardiac:  regular rhythm, tachycardic rate Lungs:  clear to auscultation bilaterally, no  wheezing, rhonchi or rales  Abd: soft, nontender, no hepatomegaly  Ext: no edema Musculoskeletal:  No deformities, BUE and BLE strength normal and equal Skin: warm and dry  Neuro:  CNs 2-12 intact, no focal abnormalities noted Psych:  Normal affect   EKG:  The EKG was personally reviewed and demonstrates:  atrial fibrillation with VR 118 Telemetry:  Telemetry was personally reviewed and demonstrates:  Afib with RVR rates in the 140-->180s --> converted to sinus tachycardia with PACs at approximately 1640  Relevant CV Studies:  Echo pending  Laboratory Data:  High Sensitivity Troponin:   Recent Labs  Lab 11/11/23 1013 11/11/23 1337  TROPONINIHS 26* 36*     Chemistry Recent Labs  Lab 11/11/23 1013  NA 138  K 2.7*  CL 96*  CO2 24  GLUCOSE 134*  BUN 12  CREATININE 1.01*  CALCIUM 9.4  MG 1.8  GFRNONAA 60*  ANIONGAP 18*    Recent Labs  Lab 11/11/23 1013  PROT 7.8  ALBUMIN 3.9  AST 32  ALT 23  ALKPHOS 63  BILITOT 0.9   Lipids No results for input(s): "CHOL", "TRIG", "HDL", "LABVLDL", "LDLCALC", "CHOLHDL" in the last 168 hours.  Hematology Recent Labs  Lab 11/11/23 1013  WBC 7.9  RBC 4.92  HGB 13.3  HCT 40.4  MCV 82.1  MCH 27.0  MCHC 32.9  RDW 14.3  PLT 287   Thyroid  Recent Labs  Lab 11/11/23 1012  TSH 2.273    BNPNo results for input(s): "BNP", "PROBNP" in the last 168 hours.  DDimer No results for input(s): "DDIMER" in the last 168 hours.   Radiology/Studies:  DG Chest Port 1 View Result Date: 11/11/2023 CLINICAL DATA:  A rhythm E a EXAM: PORTABLE CHEST  1 VIEW COMPARISON:  Chest radiograph dated 03/17/2018 FINDINGS: Patient is rotated to the right. Normal lung volumes. No focal consolidations. No pleural effusion or pneumothorax. The heart size and mediastinal contours are within normal limits. No acute osseous abnormality. IMPRESSION: No acute disease. Electronically Signed   By: Agustin Cree M.D.   On: 11/11/2023 11:06     Assessment and Plan:   Atrial fibrillation with RVR - was started on cardizem gtt now running at 15 mg/hr after 20 mg IV cardizem push - telemetry with RVR with rates as fast as 170s --< converted to ST while I was in the room with PACs - hypokalemia but Mg and TSH WNL - agree with replacing electrolytes - transition cardizem gtt to PO dosing -  will wait to see where HR lands, may not need 360 mg dose, can trial 180 mg cardizem - given elevated stroke risk, would consider OAC with eliquis 5 mg BID - would need to discuss timing of eliquis hold prior to rescheduled colonoscopy - suspect we can titrate HTN medications now with cardizem on board - obtain echo   Hypokalemia - receiving supplementation, replace Mg with 2 g IV - in the setting of colonoscopy prep and triamterene   Hypertension Hypertensive urgency - 164/109 on arrival, now improved to 149/88 on cardizem gtt - did not take BP medications this morning due to scheduled colonoscopy - reports compliance  - given cardizem, will discontinue amlodipine - suspect she will not need triamterene and hydrochlorothiazide - echo results will help guide, but given prediabetes, consider ARB such as olmesartan   Hyperlipidemia with LDL goal < 70 11/14/2022: Cholesterol 208; HDL 58.90; LDL Cholesterol 133; Triglycerides 78.0; VLDL 15.6 -Was recommended to be on Crestor as outpatient,  patient refuses statin therapy. -Will start Zetia 10 mg p.o. daily - she did not have coronary calcium score   Prediabetes - A1c 6.1%   Risk factor modification - she remains active  - walking 2 miles three times per week - lower LDL, favors not to be on statin medications.  Will start statin as noted above - no chest pain with activity greater than 4.0 METS    Risk Assessment/Risk Scores:     CHA2DS2-VASc Score = 3   This indicates a 3.2% annual risk of stroke. The patient's score is based upon: CHF History: 0 HTN History: 1 Diabetes History: 0 Stroke History: 0 Vascular Disease History: 0 Age Score: 1 Gender Score: 1       For questions or updates, please contact [facility] Please consult [email or web address] for contact info under    Signed, Marcelino Duster, [place]  11/11/2023 4:24 PM  ADDENDUM:   Patient seen and examined independently.  Accompanied by her husband and stepdaughter at bedside.  I personally taken a history, examined the patient, reviewed relevant notes,  laboratory data / imaging studies.  I performed a substantive portion of this encounter and formulated the important aspects of the plan.  I agree with the APP's note, impression, and recommendations; however, I have edited the note to reflect changes or salient points.   Denies anginal chest pain or heart failure symptoms. Was in her usual state of health until this morning when she appeared for her scheduled elective colonoscopy patient was noted to be in hypertensive urgency and tachycardia.  After arrival to ER she was noted to have new onset of Afib w/ RVR started on parenteral medications and had spontaneously converted to Sinus tachycardia.   No prior history of intracranial or gastrointestinal bleed. No known history of anemia.  PHYSICAL EXAM: Today's Vitals   11/11/23 1230 11/11/23 1245 11/11/23 1345 11/11/23 1349  BP: (!) 147/79 (!) 147/71 (!) 149/88   Pulse: (!) 108 (!) 104 (!) 125   Resp: (!) 23 18 16    Temp:    97.8 F (36.6 C)  TempSrc:    Oral  SpO2: 98% 98% 97%   Weight:      Height:      PainSc:       Body mass index is 28.35 kg/m.   Net IO Since  Admission: 27.82 mL [11/11/23 1806]  Filed Weights   11/11/23 1004  Weight: 78.5 kg    Physical Exam  Constitutional: No distress.  hemodynamically stable  Neck: No JVD present.  Cardiovascular: Normal rate, regular rhythm, S1 normal and S2 normal. Exam reveals no gallop, no S3 and no S4.  No murmur heard. Pulmonary/Chest: Effort normal and breath sounds normal. No stridor. She has no wheezes. She has no rales.  Musculoskeletal:        General: No edema.     Cervical back: Neck supple.  Skin: Skin is warm.    EKG: (personally reviewed by me) 11/11/2023: Narrow complex tachycardia, 181 bpm, ST-T changes likely rate related.  11/11/2023: Atrial fibrillation, 118 bpm, nonspecific ST-T changes.  Telemetry: (personally reviewed by me) Sinus tachycardia (now)         Impression:  New onset of atrial fibrillation with rapid ventricular rate Paroxysmal A-fib Hypokalemia Hypertension Hyperlipidemia  Recommendations:  New onset of atrial fibrillation with rapid ventricular rate Paroxysmal A-fib Newly discovered. Spontaneously converted to sinus tachycardia with initiation of Cardizem push/drip Will start Cardizem 60 mg p.o. 3 times daily with  holding parameters.  Recommend weaning off Cardizem drip 2 hours after the oral dose and monitor overnight Rhythm control: N/A Thromboembolic prophylaxis: Given her chest Vascor recommended anticoagulation.  We discussed different agents such as Xarelto, Eliquis, Coumadin.  Patient and husband stated that they prefer DOAC's (husband was on Coumadin and did not tolerate it well). Will start Eliquis 5 mg p.o. twice daily Risks, benefits, alternatives to oral anticoagulation discussed. Advised patient to alert all providers of anticoagulation therapy prior to starting a new medication or having a procedure. Emphasized importance of monitoring for signs and symptoms of bleeding (abnormal bruising, prolonged bleeding, nose bleeds, bleeding from  gums, discolored urine, black tarry stools) and to seek medical attention if present.  Patient is educated on fall precautions and if she is injured despite the mechanism of injury she will seek medical attention by going to the closest ER since she is on blood thinners.   Patient understands the importance of this because if internal bleeding is not treated in a timely manner it can further lead to morbidity and/or mortality.   Patient voices understanding of these recommendations and provides verbal feedback.  Hypokalemia: Likely precipitated by triamterene and colonoscopy prep. Electrolyte replacement per primary team Recommended stopping triamterene hydrochlorothiazide for now.  Hypertension Start losartan 50 mg p.o. daily Avoid triamterene and hydrochlorothiazide combination given the degree of hypokalemia. If needed consider hydrochlorothiazide alone for better blood pressure management Will hold amlodipine as she is getting transition to Cardizem Blood pressure management per primary team  Hyperlipidemia Chooses not to be on statin therapy. Will start Zetia 10 mg p.o. daily. Will need outpatient follow-up-will defer to PCP  Further recommendations to follow as the case evolves.   This note was created using a voice recognition software as a result there may be grammatical errors inadvertently enclosed that do not reflect the nature of this encounter. Every attempt is made to correct such errors.   Tessa Lerner, DO, [facility]  [address] [place], [place] [address] Pager: [phone] Office: [phone] 11/11/2023 6:06 PM

## 2023-11-11 NOTE — Progress Notes (Signed)
 Pt's states no medical or surgical changes since previsit or office visit.

## 2023-11-11 NOTE — Progress Notes (Signed)
 [facility] History and Physical   Primary Care Physician:  Etta Grandchild, MD   Reason for Procedure:    Encounter Diagnosis  Name Primary?   Special screening for malignant neoplasms, colon Yes     Plan:    Colonoscopy- cancel due to hypertension and arrhythmia - send to hospital via EMS     HPI: Kiara Holt is a 73 y.o. female here for screening colonoscopy. Had tachycardia today in admitting max rate 140 - no chest pain or dyspnea. Denies history of tachycardia, arrhythmia. 2024 ECG SR with PAC's  Having high BP w/o sxs - she did not take BP meds today SBP as high as 190  Max HR I have seen is 170  12 lead ECG done and looks like Afib with RVR +/- VPC's/aberranty conduction  I do not see clear ischemic changes. Though some non-specific ST-T changes.   Past Medical History:  Diagnosis Date   Arthritis    shoulder   Hyperlipidemia    Hypertension     Past Surgical History:  Procedure Laterality Date   ABDOMINAL HYSTERECTOMY  1972   COLONOSCOPY      Prior to Admission medications   Medication Sig Start Date End Date Taking? Authorizing Provider  amLODipine (NORVASC) 5 MG tablet Take 1 tablet (5 mg total) by mouth daily. 10/15/23  Yes Etta Grandchild, MD  triamterene-hydrochlorothiazide (DYAZIDE) 37.5-25 MG capsule Take 1 each (1 capsule total) by mouth daily. 10/15/23  Yes Etta Grandchild, MD  rosuvastatin (CRESTOR) 10 MG tablet Take 1 tablet (10 mg total) by mouth daily. Patient not taking: Reported on 11/11/2023 11/14/22   Etta Grandchild, MD    Current Outpatient Medications  Medication Sig Dispense Refill   amLODipine (NORVASC) 5 MG tablet Take 1 tablet (5 mg total) by mouth daily. 90 tablet 0   triamterene-hydrochlorothiazide (DYAZIDE) 37.5-25 MG capsule Take 1 each (1 capsule total) by mouth daily. 90 capsule 0   rosuvastatin (CRESTOR) 10 MG tablet Take 1 tablet (10 mg total) by mouth daily. (Patient not taking: Reported on 11/11/2023) 90  tablet 1   Current Facility-Administered Medications  Medication Dose Route Frequency Provider Last Rate Last Admin   0.9 %  sodium chloride infusion  500 mL Intravenous Once Iva Boop, MD        Allergies as of 11/11/2023   (No Known Allergies)    Family History  Problem Relation Age of Onset   Kidney disease Mother    Arthritis Mother    Diabetes Mother    Breast cancer Maternal Grandmother    Cancer Neg Hx    Heart disease Neg Hx    Hyperlipidemia Neg Hx    Hypertension Neg Hx    Stroke Neg Hx    Colon cancer Neg Hx    Colon polyps Neg Hx    Rectal cancer Neg Hx    Stomach cancer Neg Hx     Social History   Socioeconomic History   Marital status: Married    Spouse name: Nadine Counts   Number of children: Not on file   Years of education: Not on file   Highest education level: Not on file  Occupational History   Occupation: Retired  Tobacco Use   Smoking status: Never   Smokeless tobacco: Never  Vaping Use   Vaping status: Never Used  Substance and Sexual Activity   Alcohol use: No   Drug use: No   Sexual activity: Never  Other Topics Concern  Not on file  Social History Narrative   Lives with husband.   Social Drivers of Corporate investment banker Strain: Low Risk  (04/29/2023)   Overall Financial Resource Strain (CARDIA)    Difficulty of Paying Living Expenses: Not hard at all  Food Insecurity: No Food Insecurity (04/29/2023)   Hunger Vital Sign    Worried About Running Out of Food in the Last Year: Never true    Ran Out of Food in the Last Year: Never true  Transportation Needs: No Transportation Needs (04/29/2023)   PRAPARE - Administrator, Civil Service (Medical): No    Lack of Transportation (Non-Medical): No  Physical Activity: Sufficiently Active (04/29/2023)   Exercise Vital Sign    Days of Exercise per Week: 3 days    Minutes of Exercise per Session: 60 min  Stress: No Stress Concern Present (04/29/2023)   [employer] of  Occupational Health - Occupational Stress Questionnaire    Feeling of Stress : Not at all  Social Connections: Moderately Integrated (04/29/2023)   Social Connection and Isolation Panel [NHANES]    Frequency of Communication with Friends and Family: More than three times a week    Frequency of Social Gatherings with Friends and Family: More than three times a week    Attends Religious Services: More than 4 times per year    Active Member of [employer] or Organizations: No    Attends Banker Meetings: Never    Marital Status: Married  Catering manager Violence: Not At Risk (04/29/2023)   Humiliation, Afraid, Rape, and Kick questionnaire    Fear of Current or Ex-Partner: No    Emotionally Abused: No    Physically Abused: No    Sexually Abused: No    Review of Systems:  All other review of systems negative except as mentioned in the HPI.  Physical Exam: Vital signs BP (!) 159/104   Pulse (!) 114   Temp 98 F (36.7 C)   Ht 5' 5.5" (1.664 m)   Wt 173 lb (78.5 kg)   SpO2 97%   BMI 28.35 kg/m   General:   Alert,  Well-developed, well-nourished, pleasant and cooperative in NAD Lungs:  Clear throughout to auscultation.   Heart: rapid rate with premature beats, no murmurs  Abdomen:  Soft, nontender and nondistended. Normal bowel sounds.   Neuro/Psych:  Alert and cooperative. Normal mood and affect. A and O x 3   @Ocie Tino  Sena Slate, MD, [facility] [phone] (pager) 11/11/2023 8:43 AM@

## 2023-11-11 NOTE — ED Notes (Signed)
 XR at bedside

## 2023-11-11 NOTE — ED Notes (Signed)
 Lab called regarding status

## 2023-11-11 NOTE — Assessment & Plan Note (Addendum)
 Normal chloride do not suspect RTA.  Lactic acid is ordered and pending.  Less likely DKA will follow salicylate level ordered and pending.

## 2023-11-11 NOTE — Assessment & Plan Note (Addendum)
 Blood work reviewed patient has had elevated glucose levels we will check A1c.

## 2023-11-11 NOTE — H&P (Addendum)
 History and Physical    Patient: Kiara Holt:[id number] DOB: 1951/03/12 DOA: 11/11/2023 DOS: the patient was seen and examined on 11/11/2023 PCP: Etta Grandchild, MD  Patient coming from: Home Chief complaint: Chief Complaint  Patient presents with   Irregular Heart Beat   HPI:  Kiara Holt is a 73 y.o. female with past medical history  of  HTN on diuretic , Hyperlipidemia, arthritis coming from GI office where she was having a screening colonoscopy today where she was noted to be tachycardic and had HR of 180's . Pt has not cardiac history or echo in chart and previous EKG shows bigeminy. Per edmd pt rhythm has been varying between sinus tach and a.fib they have called cardiology. No c/o chest pain or sob. No le edema or SOB. No other complaints. ED Course: Pt in ed at bedside  is alert, oriented. Vital signs in the ED were notable for the following:  Vitals:   11/11/23 1245 11/11/23 1345 11/11/23 1349 11/11/23 1938  BP: (!) 147/71 (!) 149/88  (!) 170/89  Pulse: (!) 104 (!) 125  100  Temp:   97.8 F (36.6 C) 98 F (36.7 C)  Resp: 18 16  15   Height:      Weight:      SpO2: 98% 97%  96%  TempSrc:   Oral Oral  BMI (Calculated):      >>ED evaluation thus far shows: EKG shows SVT at 181, PVC and qtc of 429.  Initial troponin of 26. CMP shows hypokalemia of 2.7 glucose 134 mild AKI with a creatinine of 1.01 EGFR of 60 anion gap of 18 normal LFTs. Lactic acid, ethanol level, acetone, A1c ordered and pending. CBC is normal .  >>While in the ED patient received the following: Medications  diltiazem (CARDIZEM) 1 mg/mL load via infusion 20 mg (20 mg Intravenous Bolus from Bag 11/11/23 1119)    And  diltiazem (CARDIZEM) 125 mg in dextrose 5% 125 mL (1 mg/mL) infusion (10 mg/hr Intravenous Infusion Verify 11/11/23 1235)  potassium chloride 10 mEq in 100 mL IVPB (has no administration in time range)  potassium chloride 10 mEq in 100 mL IVPB (has no administration in time range)    Review of Systems  Cardiovascular:  Positive for palpitations.  All other systems reviewed and are negative.  Past Medical History:  Diagnosis Date   Arthritis    shoulder   Hyperlipidemia    Hypertension    Past Surgical History:  Procedure Laterality Date   ABDOMINAL HYSTERECTOMY  1972   COLONOSCOPY      reports that she has never smoked. She has never used smokeless tobacco. She reports that she does not drink alcohol and does not use drugs. No Known Allergies Family History  Problem Relation Age of Onset   Kidney disease Mother    Arthritis Mother    Diabetes Mother    Breast cancer Maternal Grandmother    Cancer Neg Hx    Heart disease Neg Hx    Hyperlipidemia Neg Hx    Hypertension Neg Hx    Stroke Neg Hx    Colon cancer Neg Hx    Colon polyps Neg Hx    Rectal cancer Neg Hx    Stomach cancer Neg Hx    Prior to Admission medications   Medication Sig Start Date End Date Taking? Authorizing Provider  amLODipine (NORVASC) 5 MG tablet Take 1 tablet (5 mg total) by mouth daily. 10/15/23   Etta Grandchild,  MD  rosuvastatin (CRESTOR) 10 MG tablet Take 1 tablet (10 mg total) by mouth daily. Patient not taking: Reported on 11/11/2023 11/14/22   Etta Grandchild, MD  triamterene-hydrochlorothiazide (DYAZIDE) 37.5-25 MG capsule Take 1 each (1 capsule total) by mouth daily. 10/15/23   Etta Grandchild, MD                                                                                 Vitals:   11/11/23 1245 11/11/23 1345 11/11/23 1349 11/11/23 1938  BP: (!) 147/71 (!) 149/88  (!) 170/89  Pulse: (!) 104 (!) 125  100  Resp: 18 16  15   Temp:   97.8 F (36.6 C) 98 F (36.7 C)  TempSrc:   Oral Oral  SpO2: 98% 97%  96%  Weight:      Height:       Physical Exam Vitals and nursing note reviewed.  Constitutional:      General: She is not in acute distress. HENT:     Head: Normocephalic and atraumatic.     Right Ear: Hearing normal.     Left Ear: Hearing normal.     Nose:  Nose normal. No nasal deformity.     Mouth/Throat:     Lips: Pink.     Tongue: No lesions.     Pharynx: Oropharynx is clear.  Eyes:     General: Lids are normal.     Extraocular Movements: Extraocular movements intact.  Cardiovascular:     Rate and Rhythm: Normal rate and regular rhythm.     Heart sounds: Normal heart sounds.  Pulmonary:     Effort: Pulmonary effort is normal.     Breath sounds: Normal breath sounds.  Abdominal:     General: Bowel sounds are normal. There is no distension.     Palpations: Abdomen is soft. There is no mass.     Tenderness: There is no abdominal tenderness.  Musculoskeletal:     Right lower leg: No edema.     Left lower leg: No edema.  Skin:    General: Skin is warm.  Neurological:     General: No focal deficit present.     Mental Status: She is alert and oriented to person, place, and time.     Cranial Nerves: Cranial nerves 2-12 are intact.  Psychiatric:        Attention and Perception: Attention normal.        Mood and Affect: Mood normal.        Speech: Speech normal.        Behavior: Behavior normal. Behavior is cooperative.     Labs on Admission: I have personally reviewed following labs and imaging studies CBC: Recent Labs  Lab 11/11/23 1013  WBC 7.9  NEUTROABS 5.9  HGB 13.3  HCT 40.4  MCV 82.1  PLT 287   Basic Metabolic Panel: Recent Labs  Lab 11/11/23 1013  NA 138  K 2.7*  CL 96*  CO2 24  GLUCOSE 134*  BUN 12  CREATININE 1.01*  CALCIUM 9.4  MG 1.8   GFR: Estimated Creatinine Clearance: 54.2 mL/min (A) (by C-G formula based on SCr of 1.01 mg/dL (H)). Liver  Function Tests: Recent Labs  Lab 11/11/23 1013  AST 32  ALT 23  ALKPHOS 63  BILITOT 0.9  PROT 7.8  ALBUMIN 3.9   No results for input(s): "LIPASE", "AMYLASE" in the last 168 hours. No results for input(s): "AMMONIA" in the last 168 hours. Coagulation Profile: No results for input(s): "INR", "PROTIME" in the last 168 hours. Cardiac Enzymes: No  results for input(s): "CKTOTAL", "CKMB", "CKMBINDEX", "TROPONINI" in the last 168 hours. BNP (last 3 results) No results for input(s): "PROBNP" in the last 8760 hours. HbA1C: Recent Labs    11/11/23 1634  HGBA1C 5.8*   CBG: No results for input(s): "GLUCAP" in the last 168 hours. Lipid Profile: No results for input(s): "CHOL", "HDL", "LDLCALC", "TRIG", "CHOLHDL", "LDLDIRECT" in the last 72 hours. Thyroid Function Tests: Recent Labs    11/11/23 1012 11/11/23 1632  TSH 2.273  --   FREET4  --  1.09   Anemia Panel: No results for input(s): "VITAMINB12", "FOLATE", "FERRITIN", "TIBC", "IRON", "RETICCTPCT" in the last 72 hours. Urine analysis:    Component Value Date/Time   COLORURINE YELLOW 11/14/2022 1032   APPEARANCEUR CLEAR 11/14/2022 1032   LABSPEC 1.015 11/14/2022 1032   PHURINE 8.0 11/14/2022 1032   GLUCOSEU NEGATIVE 11/14/2022 1032   HGBUR NEGATIVE 11/14/2022 1032   HGBUR negative 11/22/2009 0901   BILIRUBINUR NEGATIVE 11/14/2022 1032   BILIRUBINUR n 12/05/2010 0000   KETONESUR NEGATIVE 11/14/2022 1032   PROTEINUR n 12/05/2010 0000   UROBILINOGEN 0.2 11/14/2022 1032   NITRITE NEGATIVE 11/14/2022 1032   LEUKOCYTESUR LARGE (A) 11/14/2022 1032   Radiological Exams on Admission: DG Chest Port 1 View Result Date: 11/11/2023 CLINICAL DATA:  A rhythm E a EXAM: PORTABLE CHEST 1 VIEW COMPARISON:  Chest radiograph dated 03/17/2018 FINDINGS: Patient is rotated to the right. Normal lung volumes. No focal consolidations. No pleural effusion or pneumothorax. The heart size and mediastinal contours are within normal limits. No acute osseous abnormality. IMPRESSION: No acute disease. Electronically Signed   By: Agustin Cree M.D.   On: 11/11/2023 11:06   Data Reviewed: Relevant notes from primary care and specialist visits, past discharge summaries as available in EHR, including Care Everywhere. Prior diagnostic testing as pertinent to current admission diagnoses, Updated medications and  problem lists for reconciliation ED course, including vitals, labs, imaging, treatment and response to treatment,Triage notes, nursing and pharmacy notes and ED provider's notes Notable results as noted in HPI.Discussed case with EDMD/ ED APP/ or Specialty MD on call and as needed. Assessment & Plan Palpitations Suspect most likely secondary to hypokalemia, will obtain a free T4 and a TSH, dimer, 2D echocardiogram and cardiology consult cardiology.  A.fib rvr on telemetry and HR upto 140's. CHA2DS2/VAS Stroke Risk Points  Current as of about an hour ago     3 >= 2 Points: High Risk  1 to 1.99 Points: Medium Risk  0 Points: Low Risk    No Change     Details    This score determines the patient's risk of having a stroke if the  patient has atrial fibrillation.   Points Metrics  0 Has Congestive Heart Failure:  No    Current as of about an hour ago  0 Has Vascular Disease:  No    Current as of about an hour ago  1 Has Hypertension:  Yes    Current as of about an hour ago  1 Age:  38    Current as of about an hour ago  0 Has  Diabetes Excluding Gestational Diabetes:  No    Current as of about an hour ago  0 Had Stroke:  No  Had TIA:  No  Had Thromboembolism:  No    Current as of about an hour ago  1 Female:  Yes    Current as of about an hour ago   Eliquis consult . Essential hypertension, benign Vitals:   11/11/23 1002 11/11/23 1130 11/11/23 1145 11/11/23 1200  BP: (!) 164/109 132/76 (!) 142/68 (!) 141/81   11/11/23 1215 11/11/23 1230 11/11/23 1245 11/11/23 1345  BP: (!) 141/79 (!) 147/79 (!) 147/71 (!) 149/88   11/11/23 1938  BP: (!) 170/89  Currently patient is on diltiazem Home regimen includes amlodipine and triamterene-HCTZ.  GERD with esophagitis IV PPI and aspiration precaution. Prediabetes Blood work reviewed patient has had elevated glucose levels we will check A1c. Increased anion gap metabolic acidosis Normal chloride do not suspect RTA.  Lactic acid is ordered  and pending.  Less likely DKA will follow salicylate level ordered and pending. AKI (acute kidney injury) (HCC) Mild combination of dehydration and diuretics and tachycardia suspect prerenal most likely will obtain urinalysis and follow.  Gentle IV fluid hydration. Hypokalemia Suspect secondary to GI losses along with patient's HCTZ. Will replace and follow levels will keep magnesium levels above 2. Atrial fibrillation with rapid ventricular response (HCC) Cont diltiazem and PO conversion per protocol. Eliquis.   DVT prophylaxis:  Eliquis.  Consults:  Cardiology Advance Care Planning:    Code Status: Full Code   Family Communication:  None Disposition Plan:  Home Severity of Illness: The appropriate patient status for this patient is OBSERVATION. Observation status is judged to be reasonable and necessary in order to provide the required intensity of service to ensure the patient's safety. The patient's presenting symptoms, physical exam findings, and initial radiographic and laboratory data in the context of their medical condition is felt to place them at decreased risk for further clinical deterioration. Furthermore, it is anticipated that the patient will be medically stable for discharge from the hospital within 2 midnights of admission.   Unresulted Labs (From admission, onward)     Start     Ordered   11/12/23 0500  Basic metabolic panel  Tomorrow morning,   R        11/11/23 1534   11/12/23 0500  CBC  Tomorrow morning,   R        11/11/23 1534   11/11/23 1645  Lactic acid, plasma  Once,   STAT        11/11/23 1645   11/11/23 1519  Urinalysis, Complete w Microscopic -Urine, Random  Once,   URGENT       Question:  Specimen Source  Answer:  Urine, Random   11/11/23 1518   11/11/23 1518  Lactic acid, plasma  (Lactic Acid)  STAT Now then every 3 hours,   R      11/11/23 1517            Orders Placed This Encounter  Procedures   Critical Care   DG Chest Port 1 View    TSH   CBC with Differential   Comprehensive metabolic panel   Magnesium   Ethanol   Beta-hydroxybutyric acid   Hemoglobin A1c   Lactic acid, plasma   Urinalysis, Complete w Microscopic -Urine, Random   T4, free   Salicylate level   D-dimer, quantitative   HIV Antibody (routine testing w rflx)   Basic metabolic panel  CBC   Lactic acid, plasma   Diet Heart Room service appropriate? Yes; Fluid consistency: Thin   ED Cardiac monitoring   Notify physician (specify)   Strict intake and output   Target HR: 65-105 bpm with return to baseline rhythm and hemodynamic stability   Apply Atrial Arrhythmia Care Plan   Cardiac Monitoring - Continuous Indefinite   Vital signs   Notify physician (specify)   Mobility Protocol: No Restrictions RN to initiate protocols based on patient's level of care   Refer to Sidebar Report Refer to ICU, Med-Surg, Progressive, and Step-Down Mobility Protocol Sidebars   Initiate Adult Central Line Maintenance and Catheter Protocol for patients with central line (CVC, PICC, Port, Hemodialysis, Trialysis)   Do not place and if present remove PureWick   Initiate Oral Care Protocol   Initiate Carrier Fluid Protocol   RN may order General Admission PRN Orders utilizing "General Admission PRN medications" (through manage orders) for the following patient needs: allergy symptoms (Claritin), cold sores (Carmex), cough (Robitussin DM), eye irritation (Liquifilm Tears), hemorrhoids (Tucks), indigestion (Maalox), minor skin irritation (Hydrocortisone Cream), muscle pain Romeo Apple Gay), nose irritation (saline nasal spray) and sore throat (Chloraseptic spray).   SCDs   Care order/instruction: weaning off Cardizem drip 2 hours after the oral dose   Full code   Consult to hospitalist   Inpatient consult to Cardiology   APIXABAN (ELIQUIS) PER PHARMACY CONSULT   Pulse oximetry check with vital signs   Oxygen therapy Mode or (Route): Nasal cannula; Liters Per Minute: 2; Keep O2  saturation between: greater than 92 %   EKG 12-Lead   EKG 12-Lead   EKG 12-lead   EKG 12-Lead   ECHOCARDIOGRAM COMPLETE   Saline lock IV   Place in observation (patient's expected length of stay will be less than 2 midnights)   Aspiration precautions   Fall precautions    Author: Gertha Calkin, MD 12 pm -8 pm. 11/11/2023 7:45 PM >>Please note for after hour concern,or critical results past 8pm please contact the [facility] hospitalist [facility] floor coverage provider from 7 PM- 7 AM. For on call review [email or web address], username [email or web address] and PW: your phone number<<

## 2023-11-11 NOTE — Assessment & Plan Note (Addendum)
 Suspect secondary to GI losses along with patient's HCTZ. Will replace and follow levels will keep magnesium levels above 2.

## 2023-11-11 NOTE — Assessment & Plan Note (Addendum)
 Mild combination of dehydration and diuretics and tachycardia suspect prerenal most likely will obtain urinalysis and follow.  Gentle IV fluid hydration.

## 2023-11-11 NOTE — Assessment & Plan Note (Addendum)
 IV PPI and aspiration precaution.

## 2023-11-11 NOTE — Assessment & Plan Note (Addendum)
 Vitals:   11/11/23 1002 11/11/23 1130 11/11/23 1145 11/11/23 1200  BP: (!) 164/109 132/76 (!) 142/68 (!) 141/81   11/11/23 1215 11/11/23 1230 11/11/23 1245 11/11/23 1345  BP: (!) 141/79 (!) 147/79 (!) 147/71 (!) 149/88   11/11/23 1938  BP: (!) 170/89  Currently patient is on diltiazem Home regimen includes amlodipine and triamterene-HCTZ.

## 2023-11-11 NOTE — Assessment & Plan Note (Addendum)
 Suspect most likely secondary to hypokalemia, will obtain a free T4 and a TSH, dimer, 2D echocardiogram and cardiology consult cardiology.  A.fib rvr on telemetry and HR upto 140's. CHA2DS2/VAS Stroke Risk Points  Current as of about an hour ago     3 >= 2 Points: High Risk  1 to 1.99 Points: Medium Risk  0 Points: Low Risk    No Change     Details    This score determines the patient's risk of having a stroke if the  patient has atrial fibrillation.   Points Metrics  0 Has Congestive Heart Failure:  No    Current as of about an hour ago  0 Has Vascular Disease:  No    Current as of about an hour ago  1 Has Hypertension:  Yes    Current as of about an hour ago  1 Age:  73    Current as of about an hour ago  0 Has Diabetes Excluding Gestational Diabetes:  No    Current as of about an hour ago  0 Had Stroke:  No  Had TIA:  No  Had Thromboembolism:  No    Current as of about an hour ago  1 Female:  Yes    Current as of about an hour ago   Eliquis consult .

## 2023-11-11 NOTE — ED Triage Notes (Addendum)
 Patient brought in by [facility] for new onset afib. Patient was seen in doctor's office today as prep for colonoscopy, during eval staff noted new onset afib- no hx of same. Asymptomatic, no sob, palpitations, nausea or diaphoresis. Patient denies any syncopal or near syncopal events. HR 180-200. 95% on room air, placed on 2L [place] for comfort. BP 180/90.   22 RAC, given NS en route.

## 2023-11-12 ENCOUNTER — Telehealth (HOSPITAL_COMMUNITY): Payer: Self-pay | Admitting: Pharmacy Technician

## 2023-11-12 ENCOUNTER — Observation Stay (HOSPITAL_COMMUNITY)

## 2023-11-12 ENCOUNTER — Other Ambulatory Visit (HOSPITAL_COMMUNITY): Payer: Self-pay

## 2023-11-12 ENCOUNTER — Observation Stay (HOSPITAL_BASED_OUTPATIENT_CLINIC_OR_DEPARTMENT_OTHER)

## 2023-11-12 DIAGNOSIS — I480 Paroxysmal atrial fibrillation: Secondary | ICD-10-CM | POA: Diagnosis not present

## 2023-11-12 DIAGNOSIS — R9431 Abnormal electrocardiogram [ECG] [EKG]: Secondary | ICD-10-CM | POA: Diagnosis not present

## 2023-11-12 DIAGNOSIS — R002 Palpitations: Secondary | ICD-10-CM | POA: Diagnosis not present

## 2023-11-12 DIAGNOSIS — Z7901 Long term (current) use of anticoagulants: Secondary | ICD-10-CM

## 2023-11-12 DIAGNOSIS — I4891 Unspecified atrial fibrillation: Secondary | ICD-10-CM | POA: Diagnosis not present

## 2023-11-12 DIAGNOSIS — E876 Hypokalemia: Secondary | ICD-10-CM | POA: Diagnosis not present

## 2023-11-12 DIAGNOSIS — E871 Hypo-osmolality and hyponatremia: Secondary | ICD-10-CM

## 2023-11-12 LAB — URINALYSIS, COMPLETE (UACMP) WITH MICROSCOPIC
Bilirubin Urine: NEGATIVE
Glucose, UA: NEGATIVE mg/dL
Ketones, ur: NEGATIVE mg/dL
Nitrite: NEGATIVE
Protein, ur: 30 mg/dL — AB
Specific Gravity, Urine: 1.016 (ref 1.005–1.030)
WBC, UA: >50 WBC/hpf (ref 0–5)
pH: 6 (ref 5.0–8.0)

## 2023-11-12 LAB — BASIC METABOLIC PANEL WITH GFR
Anion gap: 13 (ref 5–15)
BUN: 11 mg/dL (ref 8–23)
CO2: 25 mmol/L (ref 22–32)
Calcium: 9.1 mg/dL (ref 8.9–10.3)
Chloride: 92 mmol/L — ABNORMAL LOW (ref 98–111)
Creatinine, Ser: 0.98 mg/dL (ref 0.44–1.00)
GFR, Estimated: >60 mL/min (ref >60)
Glucose, Bld: 177 mg/dL — ABNORMAL HIGH (ref 70–99)
Potassium: 3.3 mmol/L — ABNORMAL LOW (ref 3.5–5.1)
Sodium: 130 mmol/L — ABNORMAL LOW (ref 135–145)

## 2023-11-12 LAB — ECHOCARDIOGRAM COMPLETE
AR max vel: 2.19 cm2
AV Area VTI: 2.16 cm2
AV Area mean vel: 2.07 cm2
AV Mean grad: 4 mmHg
AV Peak grad: 7.6 mmHg
Ao pk vel: 1.38 m/s
Area-P 1/2: 4.39 cm2
Height: 66 in
S' Lateral: 3.2 cm
Weight: 2677.27 [oz_av]

## 2023-11-12 LAB — CBG MONITORING, ED: Glucose-Capillary: 200 mg/dL — ABNORMAL HIGH (ref 70–99)

## 2023-11-12 LAB — CBC
HCT: 38 % (ref 36.0–46.0)
Hemoglobin: 12.6 g/dL (ref 12.0–15.0)
MCH: 27.1 pg (ref 26.0–34.0)
MCHC: 33.2 g/dL (ref 30.0–36.0)
MCV: 81.7 fL (ref 80.0–100.0)
Platelets: 272 10*3/uL (ref 150–400)
RBC: 4.65 MIL/uL (ref 3.87–5.11)
RDW: 14.3 % (ref 11.5–15.5)
WBC: 7.7 10*3/uL (ref 4.0–10.5)
nRBC: 0 % (ref 0.0–0.2)

## 2023-11-12 MED ORDER — POTASSIUM CHLORIDE CRYS ER 20 MEQ PO TBCR
20.0000 meq | EXTENDED_RELEASE_TABLET | Freq: Once | ORAL | Status: AC
  Administered 2023-11-12: 20 meq via ORAL
  Filled 2023-11-12: qty 1

## 2023-11-12 MED ORDER — EZETIMIBE 10 MG PO TABS
10.0000 mg | ORAL_TABLET | Freq: Every day | ORAL | 3 refills | Status: DC
  Filled 2023-11-12 – 2023-12-09 (×2): qty 30, 30d supply, fill #0
  Filled 2024-01-07: qty 30, 30d supply, fill #1
  Filled 2024-02-05: qty 30, 30d supply, fill #2

## 2023-11-12 MED ORDER — APIXABAN 5 MG PO TABS
5.0000 mg | ORAL_TABLET | Freq: Two times a day (BID) | ORAL | 3 refills | Status: DC
  Filled 2023-11-12 – 2023-12-09 (×2): qty 60, 30d supply, fill #0
  Filled 2024-01-07: qty 60, 30d supply, fill #1
  Filled 2024-02-05: qty 60, 30d supply, fill #2

## 2023-11-12 MED ORDER — DILTIAZEM HCL ER COATED BEADS 180 MG PO CP24
180.0000 mg | ORAL_CAPSULE | Freq: Every day | ORAL | 3 refills | Status: DC
  Filled 2023-11-12 – 2023-12-09 (×2): qty 30, 30d supply, fill #0
  Filled 2024-01-07: qty 30, 30d supply, fill #1
  Filled 2024-02-05: qty 30, 30d supply, fill #2

## 2023-11-12 MED ORDER — POTASSIUM CHLORIDE CRYS ER 20 MEQ PO TBCR
40.0000 meq | EXTENDED_RELEASE_TABLET | Freq: Once | ORAL | Status: AC
  Administered 2023-11-12: 40 meq via ORAL
  Filled 2023-11-12: qty 2

## 2023-11-12 MED ORDER — LOSARTAN POTASSIUM 50 MG PO TABS
50.0000 mg | ORAL_TABLET | Freq: Every day | ORAL | 3 refills | Status: DC
  Filled 2023-11-12: qty 30, 30d supply, fill #0

## 2023-11-12 MED ORDER — ORAL CARE MOUTH RINSE: 15.0000 mL | OROMUCOSAL | Status: DC | PRN

## 2023-11-12 NOTE — Plan of Care (Signed)

## 2023-11-12 NOTE — Discharge Instructions (Signed)

## 2023-11-12 NOTE — Progress Notes (Signed)
 Rounding Note    Patient Name: Kiara Holt Date of Encounter: 11/12/2023  [facility] Cardiologist: Tessa Lerner, DO  Chief Complaint:  Chief Complaint  Patient presents with   Irregular Heart Beat  Reason of consult: Afib RVR   Subjective   Denies anginal chest pain, heart failure symptoms, palpitations. Remain in sinus rhythm overnight Husband at bedside. They both expressed interest with regards to being discharged home later today.   Inpatient Medications    Scheduled Meds:  apixaban  5 mg Oral BID   diltiazem  60 mg Oral Q8H   ezetimibe  10 mg Oral Daily   losartan  50 mg Oral Daily   pantoprazole (PROTONIX) IV  40 mg Intravenous Q24H   potassium chloride  40 mEq Oral Once   Followed by   potassium chloride  20 mEq Oral Once   Continuous Infusions:  sodium chloride     PRN Meds: acetaminophen **OR** acetaminophen, ondansetron **OR** ondansetron (ZOFRAN) IV   Vital Signs    Vitals:   11/12/23 0632 11/12/23 0651 11/12/23 0700 11/12/23 0800  BP: 126/65 126/65 118/60 135/69  Pulse:   72 83  Resp:  (!) 23 12 (!) 22  Temp:  98.2 F (36.8 C) 98.2 F (36.8 C)   TempSrc:  Oral Oral   SpO2:  96% 96% 98%  Weight:      Height:        Intake/Output Summary (Last 24 hours) at 11/12/2023 0844 Last data filed at 11/11/2023 1235 Gross per 24 hour  Intake 27.82 ml  Output --  Net 27.82 ml      11/11/2023   10:04 AM 11/11/2023    8:05 AM 10/24/2023   10:30 AM  Last 3 Weights  Weight (lbs) 173 lb 173 lb 173 lb  Weight (kg) 78.472 kg 78.472 kg 78.472 kg      Telemetry    Normal sinus rhythm- Personally Reviewed  ECG    No new tracings- Personally Reviewed  Physical Exam   Physical Exam  Constitutional: No distress.  hemodynamically stable  Neck: No JVD present.  Cardiovascular: Normal rate, regular rhythm, S1 normal and S2 normal. Exam reveals no gallop, no S3 and no S4.  No murmur heard. Pulmonary/Chest: Effort normal and breath sounds  normal. No stridor. She has no wheezes. She has no rales.  Musculoskeletal:        General: No edema.     Cervical back: Neck supple.  Skin: Skin is warm.    Labs    High Sensitivity Troponin:   Recent Labs  Lab 11/11/23 1013 11/11/23 1337 11/11/23 1632  TROPONINIHS 26* 36* 35*     Chemistry Recent Labs  Lab 11/11/23 1013 11/12/23 0537  NA 138 130*  K 2.7* 3.3*  CL 96* 92*  CO2 24 25  GLUCOSE 134* 177*  BUN 12 11  CREATININE 1.01* 0.98  CALCIUM 9.4 9.1  MG 1.8  --   PROT 7.8  --   ALBUMIN 3.9  --   AST 32  --   ALT 23  --   ALKPHOS 63  --   BILITOT 0.9  --   GFRNONAA 60* >60  ANIONGAP 18* 13    Lipids No results for input(s): "CHOL", "TRIG", "HDL", "LABVLDL", "LDLCALC", "CHOLHDL" in the last 168 hours.  Hematology Recent Labs  Lab 11/11/23 1013 11/12/23 0537  WBC 7.9 7.7  RBC 4.92 4.65  HGB 13.3 12.6  HCT 40.4 38.0  MCV 82.1 81.7  MCH 27.0 27.1  MCHC 32.9 33.2  RDW 14.3 14.3  PLT 287 272   Thyroid  Recent Labs  Lab 11/11/23 1012 11/11/23 1632  TSH 2.273  --   FREET4  --  1.09    BNPNo results for input(s): "BNP", "PROBNP" in the last 168 hours.  DDimer  Recent Labs  Lab 11/11/23 1632  DDIMER 0.41     Radiology    NA  Cardiac Studies   Echocardiogram pending  Patient Profile     73 y.o. female  hx of hypertension, prediabetes, and hyperlipidemia   Assessment & Plan    New onset of A-fib with RVR, now paroxysmal Rate control: Diltiazem 60 mg p.o. 3 times daily and transition to qday dosing closer to discharge.  Rhythm control: N/A Thromboembolic prophylaxis: Eliquis For reasons unknown patient did not get oral diltiazem yesterday 11/11/2023 she got her first dose of oral diltiazem this morning and the drip was DC'd earlier this morning. She remains in sinus rhythm Repeat EKG has not been performed yet, will reorder Echocardiogram pending TSH within normal limits.  Risks, benefits, alternatives to oral anticoagulation  discussed Click Here to Calculate/Change CHADS2VASc Score The patient's CHADS2-VASc score is 3, indicating a 3.2% annual risk of stroke.   CHF History: No HTN History: Yes Diabetes History: No Stroke History: No Vascular Disease History: No  Hypokalemia: Improving. Electrolyte replacement per primary team  Hyponatremia: Management per primary team  Hypertension: Improved. Continue losartan 50 mg p.o. daily Reemphasized importance of keeping ambulatory blood pressure log to see if further medication titration is warranted. Reemphasized importance of low-salt diet.  Hyperlipidemia: Chooses not to be on statin therapy. Started Zetia 10 mg p.o. daily, tolerating it well. Recommend fasting lipid profile in 6 weeks to reevaluate therapy Was recommended to have a coronary calcium score as outpatient by PCP, this is still pending and strongly encouraged.  Independently reviewed:  Telemetry  Labs 04/078/2025 Progress notes since yesterday.    Prescription drug management: Medication reviewed and dose adjusted.    Coordination of care: Patient, husband, and RN updated during rounds.     For questions or updates, please contact [facility] Please consult [email or web address] for contact info under      Signed, Tessa Lerner, DO, [facility]  [employer]  [address] [place], [place] [address] Pager: [phone] Office: [phone] 11/12/2023, 8:44 AM

## 2023-11-12 NOTE — Progress Notes (Signed)
 Patient given PO Diltizem this morning and Diltizem gtt stopped.

## 2023-11-12 NOTE — Discharge Summary (Signed)
 Physician Discharge Summary   Patient: Kiara Holt MRN: [id number] DOB: June 23, 1951  Admit date:     11/11/2023  Discharge date: 11/12/23  Discharge Physician: MDALA-GAUSI, Gwenette Greet   PCP: Etta Grandchild, MD   Recommendations at discharge:   Follow-up with cardiology in 3 months  Discharge Diagnoses: Principal Problem:   Palpitations Active Problems:   Prediabetes   Benign hypertension   GERD with esophagitis   Increased anion gap metabolic acidosis   AKI (acute kidney injury) (HCC)   Hypokalemia   Atrial fibrillation with rapid ventricular response (HCC)   Mixed hyperlipidemia   Paroxysmal atrial fibrillation (HCC)   Current use of long term anticoagulation   Hyponatremia  Resolved Problems:   * No resolved hospital problems. *  Hospital Course:  73 y.o. female with past medical history  of  HTN on diuretic , Hyperlipidemia, arthritis coming from GI office where she was to have a screening colonoscopy today but was found to be tachycardic with heart rate in the 180s.  She presented to the ED and was found to be in atrial fibrillation with RVR.  Cardiology was consulted.    The hospital course is in problem-based format below:    Assessment and Plan:  Atrial fibrillation with rapid ventricular response [facility]) Cardiology was consulted. Patient was started on a diltiazem infusion and then transitioned to oral diltiazem. She was started on Eliquis. Rhythm converted and she remained stable on oral diltiazem. TTE revealed EF 55 to 60%, diastolic dysfunction, no notable valvular abnormalities, normal size of left atrium.  She is to continue on oral diltiazem and Eliquis at discharge. She will follow-up with cardiology in 3 months.  Hypertension Patient has been on triamterene-hydrochlorothiazide, amlodipine prior to admission. Due to need for diltiazem, amlodipine was discontinued. Triamterene-hydrochlorothiazide was also discontinued in favor of  losartan.  Hyperlipidemia Patient had chosen not to be on statin therapy. She has been started on Zetia. Cardiology recommending fasting lipid profile in 6 weeks to reevaluate therapy. Cardiology also recommending coronary calcium score as outpatient by PCP.  Hypokalemia Potassium was repleted as needed  GERD with esophagitis Home PPI was continued.  Prediabetes A1c 5.8.        Consultants: Cardiology Procedures performed: n/a  Disposition: Home Diet recommendation:  Discharge Diet Orders (From admission, onward)     Start     Ordered   11/12/23 0000  Diet - low sodium heart healthy        11/12/23 1613           Cardiac diet DISCHARGE MEDICATION: Allergies as of 11/12/2023   No Known Allergies      Medication List     STOP taking these medications    amLODipine 5 MG tablet Commonly known as: NORVASC   triamterene-hydrochlorothiazide 37.5-25 MG capsule Commonly known as: DYAZIDE       TAKE these medications    apixaban 5 MG Tabs tablet Commonly known as: ELIQUIS Take 1 tablet (5 mg total) by mouth 2 (two) times daily.   diltiazem 180 MG 24 hr capsule Commonly known as: Cardizem CD Take 1 capsule (180 mg total) by mouth daily.   ezetimibe 10 MG tablet Commonly known as: ZETIA Take 1 tablet (10 mg total) by mouth daily. Start taking on: November 13, 2023   losartan 50 MG tablet Commonly known as: COZAAR Take 1 tablet (50 mg total) by mouth daily. Start taking on: November 13, 2023        Discharge Exam:  Filed Weights   11/11/23 1004 11/12/23 1222  Weight: 78.5 kg 75.9 kg     General: Alert, oriented X3  Eyes: Pupils equal, reactive  Oral cavity: moist mucous membranes  Head: Atraumatic, normocephalic  Neck: supple  Chest: clear to auscultation. No crackles, no wheezes  CVS: S1,S2 RRR. No murmurs  Abd: No distention, soft, non-tender. No masses palpable  Extr: No edema   MSK: No joint deformities or swelling  Neurological: Grossly  intact.    Condition at discharge: good  The results of significant diagnostics from this hospitalization (including imaging, microbiology, ancillary and laboratory) are listed below for reference.   Imaging Studies: ECHOCARDIOGRAM COMPLETE Result Date: 11/12/2023    ECHOCARDIOGRAM REPORT   Patient Name:   Kiara Holt Date of Exam: 11/12/2023 Medical Rec #:  [id number]        Height:       66.0 in Accession #:    [id number]       Weight:       167.3 lb Date of Birth:  Feb 12, 1951        BSA:          1.854 m Patient Age:    72 years         BP:           136/77 mmHg Patient Gender: F                HR:           85 bpm. Exam Location:  Inpatient Procedure: 2D Echo, Cardiac Doppler and Color Doppler (Both Spectral and Color            Flow Doppler were utilized during procedure). Indications:    Abn ECG  History:        Patient has no prior history of Echocardiogram examinations.                 Arrythmias:Atrial Fibrillation; Risk Factors:Dyslipidemia and                 Hypertension.  Sonographer:    Melton Krebs RDCS, FE, PE Referring Phys: [id number] SUNIT TOLIA IMPRESSIONS  1. Left ventricular ejection fraction, by estimation, is 55 to 60%. The left ventricle has normal function. The left ventricle has no regional wall motion abnormalities. Left ventricular diastolic parameters are consistent with Grade I diastolic dysfunction (impaired relaxation).  2. Right ventricular systolic function is normal. The right ventricular size is normal.  3. The mitral valve is grossly normal. Trivial mitral valve regurgitation.  4. The aortic valve is tricuspid. Aortic valve regurgitation is not visualized. Aortic valve sclerosis is present, with no evidence of aortic valve stenosis.  5. The inferior vena cava is normal in size with greater than 50% respiratory variability, suggesting right atrial pressure of 3 mmHg. Comparison(s): No prior Echocardiogram. FINDINGS  Left Ventricle: Left ventricular ejection fraction, by  estimation, is 55 to 60%. The left ventricle has normal function. The left ventricle has no regional wall motion abnormalities. Strain was performed and the global longitudinal strain is indeterminate. The left ventricular internal cavity size was normal in size. There is no left ventricular hypertrophy. Left ventricular diastolic parameters are consistent with Grade I diastolic dysfunction (impaired relaxation). Right Ventricle: The right ventricular size is normal. No increase in right ventricular wall thickness. Right ventricular systolic function is normal. Left Atrium: Left atrial size was normal in size. Right Atrium: Right atrial size was normal in size. Pericardium: Trivial pericardial effusion  is present. The pericardial effusion is posterior to the left ventricle. Mitral Valve: The mitral valve is grossly normal. Trivial mitral valve regurgitation. Tricuspid Valve: The tricuspid valve is normal in structure. Tricuspid valve regurgitation is trivial. No evidence of tricuspid stenosis. Aortic Valve: The aortic valve is tricuspid. Aortic valve regurgitation is not visualized. Aortic valve sclerosis is present, with no evidence of aortic valve stenosis. Aortic valve mean gradient measures 4.0 mmHg. Aortic valve peak gradient measures 7.6  mmHg. Aortic valve area, by VTI measures 2.16 cm. Pulmonic Valve: The pulmonic valve was normal in structure. Pulmonic valve regurgitation is not visualized. No evidence of pulmonic stenosis. Aorta: The aortic root is normal in size and structure. Venous: The inferior vena cava is normal in size with greater than 50% respiratory variability, suggesting right atrial pressure of 3 mmHg. IAS/Shunts: The atrial septum is grossly normal. Additional Comments: 3D was performed not requiring image post processing on an independent workstation and was indeterminate.  LEFT VENTRICLE PLAX 2D LVIDd:         4.50 cm   Diastology LVIDs:         3.20 cm   LV e' medial:    6.53 cm/s LV PW:          0.80 cm   LV E/e' medial:  11.6 LV IVS:        0.80 cm   LV e' lateral:   9.64 cm/s LVOT diam:     2.00 cm   LV E/e' lateral: 7.9 LV SV:         54 LV SV Index:   29 LVOT Area:     3.14 cm  RIGHT VENTRICLE RV Basal diam:  3.50 cm RV Mid diam:    2.60 cm RV S prime:     12.60 cm/s TAPSE (M-mode): 1.5 cm LEFT ATRIUM             Index        RIGHT ATRIUM           Index LA diam:        2.90 cm 1.56 cm/m   RA Area:     12.90 cm LA Vol (A2C):   53.6 ml 28.91 ml/m  RA Volume:   27.20 ml  14.67 ml/m LA Vol (A4C):   40.1 ml 21.63 ml/m LA Biplane Vol: 48.7 ml 26.27 ml/m  AORTIC VALVE AV Area (Vmax):    2.19 cm AV Area (Vmean):   2.07 cm AV Area (VTI):     2.16 cm AV Vmax:           138.00 cm/s AV Vmean:          94.600 cm/s AV VTI:            0.252 m AV Peak Grad:      7.6 mmHg AV Mean Grad:      4.0 mmHg LVOT Vmax:         96.30 cm/s LVOT Vmean:        62.400 cm/s LVOT VTI:          0.173 m LVOT/AV VTI ratio: 0.69  AORTA Ao Root diam: 2.70 cm MITRAL VALVE               TRICUSPID VALVE MV Area (PHT): 4.39 cm    TR Peak grad:   23.0 mmHg MV Decel Time: 173 msec    TR Vmax:        240.00 cm/s MV E velocity: 76.00  cm/s MV A velocity: 72.70 cm/s  SHUNTS MV E/A ratio:  1.05        Systemic VTI:  0.17 m                            Systemic Diam: 2.00 cm Riley Lam MD Electronically signed by Riley Lam MD Signature Date/Time: 11/12/2023/3:23:47 PM    Final    DG Chest Port 1 View Result Date: 11/11/2023 CLINICAL DATA:  A rhythm E a EXAM: PORTABLE CHEST 1 VIEW COMPARISON:  Chest radiograph dated 03/17/2018 FINDINGS: Patient is rotated to the right. Normal lung volumes. No focal consolidations. No pleural effusion or pneumothorax. The heart size and mediastinal contours are within normal limits. No acute osseous abnormality. IMPRESSION: No acute disease. Electronically Signed   By: Agustin Cree M.D.   On: 11/11/2023 11:06   MM 3D SCREENING MAMMOGRAM BILATERAL BREAST Result Date:  10/24/2023 CLINICAL DATA:  Screening. EXAM: DIGITAL SCREENING BILATERAL MAMMOGRAM WITH TOMOSYNTHESIS AND CAD TECHNIQUE: Bilateral screening digital craniocaudal and mediolateral oblique mammograms were obtained. Bilateral screening digital breast tomosynthesis was performed. The images were evaluated with computer-aided detection. COMPARISON:  Previous exam(s). ACR Breast Density Category c: The breasts are heterogeneously dense, which may obscure small masses. FINDINGS: There are no findings suspicious for malignancy. IMPRESSION: No mammographic evidence of malignancy. A result letter of this screening mammogram will be mailed directly to the patient. RECOMMENDATION: Screening mammogram in one year. (Code:SM-B-01Y) BI-RADS CATEGORY  1: Negative. Electronically Signed   By: Amie Portland M.D.   On: 10/24/2023 15:23    Microbiology: No results found for this or any previous visit.  Labs: CBC: Recent Labs  Lab 11/11/23 1013 11/12/23 0537  WBC 7.9 7.7  NEUTROABS 5.9  --   HGB 13.3 12.6  HCT 40.4 38.0  MCV 82.1 81.7  PLT 287 272   Basic Metabolic Panel: Recent Labs  Lab 11/11/23 1013 11/12/23 0537  NA 138 130*  K 2.7* 3.3*  CL 96* 92*  CO2 24 25  GLUCOSE 134* 177*  BUN 12 11  CREATININE 1.01* 0.98  CALCIUM 9.4 9.1  MG 1.8  --    Liver Function Tests: Recent Labs  Lab 11/11/23 1013  AST 32  ALT 23  ALKPHOS 63  BILITOT 0.9  PROT 7.8  ALBUMIN 3.9   CBG: Recent Labs  Lab 11/12/23 0830  GLUCAP 200*    Discharge time spent: greater than 30 minutes.  Signed: MDALA-GAUSI, Gwenette Greet, MD [facility] 11/12/2023

## 2023-11-12 NOTE — TOC Transition Note (Addendum)
 Transition of Care [facility]) - Discharge Note   Patient Details  Name: Kiara Holt MRN: [id number] Date of Birth: 03-29-1951  Transition of Care [facility]) CM/SW Contact:  Leone Haven, RN Phone Number: 11/12/2023, 4:10 PM   Clinical Narrative:    For dc today, she has no needs. Spouse is at the bedside .   Will be on eliquis, copay is 25.00.          Patient Goals and CMS Choice            Discharge Placement                       Discharge Plan and Services Additional resources added to the After Visit Summary for                                       Social Drivers of Health (SDOH) Interventions SDOH Screenings   Food Insecurity: No Food Insecurity (11/11/2023)  Housing: Low Risk  (11/11/2023)  Transportation Needs: No Transportation Needs (11/11/2023)  Utilities: Not At Risk (11/11/2023)  Alcohol Screen: Low Risk  (04/29/2023)  Depression (PHQ2-9): Low Risk  (04/29/2023)  Financial Resource Strain: Low Risk  (04/29/2023)  Physical Activity: Sufficiently Active (04/29/2023)  Social Connections: Moderately Integrated (11/11/2023)  Stress: No Stress Concern Present (04/29/2023)  Tobacco Use: Low Risk  (11/11/2023)  Health Literacy: Adequate Health Literacy (04/29/2023)     Readmission Risk Interventions     No data to display

## 2023-11-12 NOTE — Telephone Encounter (Signed)
 Patient Product/process development scientist completed.    The patient is insured through [facility]. Patient has Medicare and is not eligible for a copay card, but may be able to apply for patient assistance or Medicare RX Payment Plan (Patient Must reach out to their plan, if eligible for payment plan), if available.    Ran test claim for Eliquis 5 mg and the current 30 day co-pay is $25.00.   This test claim was processed through [facility]- copay amounts may vary at other pharmacies due to pharmacy/plan contracts, or as the patient moves through the different stages of their insurance plan.     Roland Earl, CPHT Pharmacy Technician III Certified Patient Advocate [facility] Patient Advocate Team Direct Number: [phone]  Fax: [phone]

## 2023-11-12 NOTE — TOC CM/SW Note (Signed)
 Transition of Care [facility]) - Inpatient Brief Assessment   Patient Details  Name: Kiara Holt MRN: [id number] Date of Birth: 1951/03/11  Transition of Care [facility]) CM/SW Contact:    Leone Haven, RN Phone Number: 11/12/2023, 4:03 PM   Clinical Narrative: From home with spouse, has PCP and insurance on file, states has no HH services in place at this time or DME at home.  States family member will transport them home at [employer] and family is support system, states gets medications from [facility] on [address], but would like TOC pharmacy to fill any meds she is going home with.    Pta self ambulatory.   Transition of Care Asessment: Insurance and Status: Insurance coverage has been reviewed Patient has primary care physician: Yes Home environment has been reviewed: lives with spouse Prior level of function:: indep Prior/Current Home Services: No current home services Social Drivers of Health Review: SDOH reviewed no interventions necessary Readmission risk has been reviewed: Yes Transition of care needs: no transition of care needs at this time

## 2023-11-12 NOTE — Care Management Obs Status (Signed)
 MEDICARE OBSERVATION STATUS NOTIFICATION   Patient Details  Name: Kiara Holt MRN: [id number] Date of Birth: [birth year]   Medicare Observation Status Notification Given:  Yes    Leone Haven, RN 11/12/2023, 3:59 PM

## 2023-11-14 ENCOUNTER — Ambulatory Visit: Admitting: Internal Medicine

## 2023-11-19 ENCOUNTER — Ambulatory Visit: Admitting: Internal Medicine

## 2023-11-19 ENCOUNTER — Encounter: Payer: Self-pay | Admitting: Internal Medicine

## 2023-11-19 VITALS — BP 160/80 | HR 97 | Temp 98.3°F | Ht 66.0 in | Wt 170.0 lb

## 2023-11-19 DIAGNOSIS — E876 Hypokalemia: Secondary | ICD-10-CM

## 2023-11-19 DIAGNOSIS — R7303 Prediabetes: Secondary | ICD-10-CM | POA: Diagnosis not present

## 2023-11-19 DIAGNOSIS — I480 Paroxysmal atrial fibrillation: Secondary | ICD-10-CM | POA: Diagnosis not present

## 2023-11-19 DIAGNOSIS — I10 Essential (primary) hypertension: Secondary | ICD-10-CM | POA: Diagnosis not present

## 2023-11-19 LAB — CBC
HCT: 36.5 % (ref 36.0–46.0)
Hemoglobin: 11.9 g/dL — ABNORMAL LOW (ref 12.0–15.0)
MCHC: 32.5 g/dL (ref 30.0–36.0)
MCV: 84.3 fl (ref 78.0–100.0)
Platelets: 253 10*3/uL (ref 150.0–400.0)
RBC: 4.33 Mil/uL (ref 3.87–5.11)
RDW: 14.8 % (ref 11.5–15.5)
WBC: 4.9 10*3/uL (ref 4.0–10.5)

## 2023-11-19 LAB — COMPREHENSIVE METABOLIC PANEL WITH GFR
ALT: 38 U/L — ABNORMAL HIGH (ref 0–35)
AST: 34 U/L (ref 0–37)
Albumin: 4.4 g/dL (ref 3.5–5.2)
Alkaline Phosphatase: 58 U/L (ref 39–117)
BUN: 7 mg/dL (ref 6–23)
CO2: 31 meq/L (ref 19–32)
Calcium: 9.7 mg/dL (ref 8.4–10.5)
Chloride: 99 meq/L (ref 96–112)
Creatinine, Ser: 0.75 mg/dL (ref 0.40–1.20)
GFR: 80.43 mL/min (ref >60.00)
Glucose, Bld: 114 mg/dL — ABNORMAL HIGH (ref 70–99)
Potassium: 4.1 meq/L (ref 3.5–5.1)
Sodium: 138 meq/L (ref 135–145)
Total Bilirubin: 0.5 mg/dL (ref 0.2–1.2)
Total Protein: 7.3 g/dL (ref 6.0–8.3)

## 2023-11-19 MED ORDER — TRIAMTERENE-HCTZ 37.5-25 MG PO TABS: 1.0000 | ORAL_TABLET | Freq: Every day | ORAL | 3 refills | Status: AC

## 2023-11-19 NOTE — Progress Notes (Signed)
   Subjective:   Patient ID: Kiara Holt, female    DOB: Oct 16, 1950, 73 y.o.   MRN: [id number]  HPI The patient is a 73 YO female coming in for hospital follow up (new A fib with rvr during colonoscopy, taken to hospital, echo normal started on diltiazem drip and eliquis). She is feeling bad from losartan and it was stopped in thepast due to similar symptoms. She is taking but not feeling good. Denies palpitations. Denies SOB or chest pains. She did take a break from the gym about 1 week after discharge but is feeling like going back. Denies headaches. Denies constipation or blood in stool.   PMH, [facility], social history reviewed and updated  Review of Systems  Constitutional: Negative.   HENT: Negative.    Eyes: Negative.   Respiratory:  Negative for cough, chest tightness and shortness of breath.   Cardiovascular:  Negative for chest pain, palpitations and leg swelling.  Gastrointestinal:  Negative for abdominal distention, abdominal pain, constipation, diarrhea, nausea and vomiting.  Musculoskeletal: Negative.   Skin: Negative.   Neurological: Negative.   Psychiatric/Behavioral: Negative.      Objective:  Physical Exam Constitutional:      Appearance: She is well-developed.  HENT:     Head: Normocephalic and atraumatic.  Cardiovascular:     Rate and Rhythm: Normal rate and regular rhythm.  Pulmonary:     Effort: Pulmonary effort is normal. No respiratory distress.     Breath sounds: Normal breath sounds. No wheezing or rales.  Abdominal:     General: Bowel sounds are normal. There is no distension.     Palpations: Abdomen is soft.     Tenderness: There is no abdominal tenderness. There is no rebound.  Musculoskeletal:     Cervical back: Normal range of motion.  Skin:    General: Skin is warm and dry.  Neurological:     Mental Status: She is alert and oriented to person, place, and time.     Coordination: Coordination normal.     Vitals:   11/19/23 1050 11/19/23 1054   BP: (!) 160/80 (!) 160/80  Pulse: 97   Temp: 98.3 F (36.8 C)   TempSrc: Oral   SpO2: 98%   Weight: 170 lb (77.1 kg)   Height: 5\' 6"  (1.676 m)     Assessment & Plan:

## 2023-11-19 NOTE — Assessment & Plan Note (Signed)
 Reviewed recent labs 5.8 with some improvement. She is encouraged to maintain lifestyle changes and exercise regularly at the gym.

## 2023-11-19 NOTE — Assessment & Plan Note (Signed)
 BP is elevated with change from hydrochlorothiazide to losartan. In addition she is getting side effects with losartan. We will stop losartan and resume triamterene/hydrochlorothiazide 37.5/25 mg daily. Continue diltiazem 180 mg daily. Checking CMP and CBC today. Will need follow up in 2-4 weeks with PCP to recheck BP and BMP with this medication change.

## 2023-11-19 NOTE — Assessment & Plan Note (Signed)
 Sounds regular today. Checking CMP and CBC. We will stop losartan and restart her normal triamterene/hydrochlorothiazide. New rx done today. Advised to continue eliquis and diltiazem. Counseled about A fib and etiology as well as potential long term consequences. Advised follow up within 1 month with PCP and would recommend EKG then for surveillance as well will need repeat BMP due to medication change.

## 2023-11-19 NOTE — Patient Instructions (Signed)
 Stop the losartan. We will have you go back on triamterene/hydrochlorothiazide.   Keep taking diltiazem and eliquis as well.

## 2023-11-19 NOTE — Assessment & Plan Note (Signed)
 Checking CMP for resolution.

## 2023-12-09 ENCOUNTER — Other Ambulatory Visit (HOSPITAL_COMMUNITY): Payer: Self-pay

## 2024-01-08 ENCOUNTER — Other Ambulatory Visit: Payer: Self-pay | Admitting: Internal Medicine

## 2024-01-08 DIAGNOSIS — I10 Essential (primary) hypertension: Secondary | ICD-10-CM

## 2024-01-31 ENCOUNTER — Ambulatory Visit: Attending: Cardiology | Admitting: Cardiology

## 2024-01-31 ENCOUNTER — Encounter: Payer: Self-pay | Admitting: Cardiology

## 2024-01-31 VITALS — BP 166/78 | HR 99 | Resp 16 | Ht 66.0 in | Wt 177.6 lb

## 2024-01-31 DIAGNOSIS — I480 Paroxysmal atrial fibrillation: Secondary | ICD-10-CM

## 2024-01-31 DIAGNOSIS — E782 Mixed hyperlipidemia: Secondary | ICD-10-CM

## 2024-01-31 DIAGNOSIS — I10 Essential (primary) hypertension: Secondary | ICD-10-CM

## 2024-01-31 DIAGNOSIS — R7303 Prediabetes: Secondary | ICD-10-CM

## 2024-01-31 DIAGNOSIS — Z7901 Long term (current) use of anticoagulants: Secondary | ICD-10-CM | POA: Diagnosis not present

## 2024-01-31 NOTE — Patient Instructions (Signed)
 Medication Instructions:  No medication changes were made at this visit. Continue current regimen.   *If you need a refill on your cardiac medications before your next appointment, please call your pharmacy*  Lab Work: To be completed in 6 months (prior to office visit): hemoglobin/hematocrit   If you have labs (blood work) drawn today and your tests are completely normal, you will receive your results only by: MyChart Message (if you have MyChart) OR A paper copy in the mail If you have any lab test that is abnormal or we need to change your treatment, we will call you to review the results.  Testing/Procedures: None ordered today.  Follow-Up: At [facility], you and your health needs are our priority.  As part of our continuing mission to provide you with exceptional heart care, our providers are all part of one team.  This team includes your primary Cardiologist (physician) and Advanced Practice Providers or APPs (Physician Assistants and Nurse Practitioners) who all work together to provide you with the care you need, when you need it.  Your next appointment:   6 month(s)  Provider:   Madonna Large, DO

## 2024-01-31 NOTE — Progress Notes (Signed)
 Cardiology Office Note:  .   Date:  01/31/2024  ID:  Kiara Holt, DOB 12-Mar-1951, MRN [id number] PCP:  Joshua Debby CROME, MD  Former Cardiology Providers: NA [facility] Providers Cardiologist:  Madonna Large, DO , [facility] (established care 11/11/2023) Electrophysiologist:  None  Click to update primary MD,subspecialty MD or APP then REFRESH:1}    Chief Complaint  Patient presents with   Atrial Fibrillation   New Patient (Initial Visit)    History of Present Illness: .   Kiara Holt is a 73 y.o. African-American female whose past medical history and cardiovascular risk factors includes: Hypertension, prediabetes, hyperlipidemia, atrial fibrillation.  Patient was seen in consult in April 2025 at [facility].  She was scheduled for outpatient colonoscopy and was found to be hypertensive with SBP 190 mmHg and tachycardic.  She did not take her morning antihypertensive medications due to the colonoscopy due to these variables patient's colonoscopy was canceled and she was sent to ED for further evaluation and management.  In the emergency room department EKG noted A-fib with RVR cardiology consulted for further recommendations.  Patient was started on a Cardizem  drip and spontaneously converted to sinus rhythm.  Prescription drug management was changed to reflect the new diagnosis of A-fib prior to discharge she presents today for follow-up.  Since her hospital discharge patient states that she is doing well overall no anginal chest pain or heart failure symptoms. She remains on Eliquis  for thromboembolic prophylaxis and does not endorse evidence of bleeding. No reoccurrence of atrial fibrillation to her acknowledgment.  She walks 2 miles 3 times a week on a treadmill at the gym.  Review of Systems: .   Review of Systems  Cardiovascular:  Negative for chest pain, claudication, irregular heartbeat, leg swelling, near-syncope, orthopnea, palpitations, paroxysmal nocturnal  dyspnea and syncope.  Respiratory:  Negative for shortness of breath.   Hematologic/Lymphatic: Negative for bleeding problem.    Studies Reviewed:   EKG: EKG Interpretation Date/Time:  Friday January 31 2024 08:27:15 EDT Ventricular Rate:  100 PR Interval:  142 QRS Duration:  66 QT Interval:  334 QTC Calculation: 430 R Axis:   45  Text Interpretation: Sinus rhythm with Premature atrial complexes Nonspecific ST abnormality When compared with ECG of 11-Nov-2023 12:01, Sinus rhythm has replaced Atrial fibrillation Confirmed by Large Madonna [phone]) on 01/31/2024 8:38:37 AM  Echocardiogram: 11/2023  1. Left ventricular ejection fraction, by estimation, is 55 to 60%. The  left ventricle has normal function. The left ventricle has no regional wall motion abnormalities. Left ventricular diastolic parameters are consistent with Grade I diastolic  dysfunction (impaired relaxation).   2. Right ventricular systolic function is normal. The right ventricular size is normal.   3. The mitral valve is grossly normal. Trivial mitral valve  regurgitation.   4. The aortic valve is tricuspid. Aortic valve regurgitation is not visualized. Aortic valve sclerosis is present, with no evidence of aortic valve stenosis.   5. The inferior vena cava is normal in size with greater than 50% respiratory variability, suggesting right atrial pressure of 3 mmHg.   Stress Testing: NA  Cardiac monitor: NA  RADIOLOGY: NA  Risk Assessment/Calculations:   Click Here to Calculate/Change CHADS2VASc Score The patient's CHADS2-VASc score is 3, indicating a 3.2% annual risk of stroke.    Labs:       Latest Ref Rng & Units 11/19/2023   11:24 AM 11/12/2023    5:37 AM 11/11/2023   10:13 AM  CBC  WBC 4.0 - 10.5 K/uL 4.9  7.7  7.9   Hemoglobin 12.0 - 15.0 g/dL 88.0  87.3  86.6   Hematocrit 36.0 - 46.0 % 36.5  38.0  40.4   Platelets 150.0 - 400.0 K/uL 253.0  272  287        Latest Ref Rng & Units 11/19/2023   11:24 AM  11/12/2023    5:37 AM 11/11/2023   10:13 AM  BMP  Glucose 70 - 99 mg/dL 885  822  865   BUN 6 - 23 mg/dL 7  11  12    Creatinine 0.40 - 1.20 mg/dL 9.24  9.01  8.98   Sodium 135 - 145 mEq/L 138  130  138   Potassium 3.5 - 5.1 mEq/L 4.1  3.3  2.7   Chloride 96 - 112 mEq/L 99  92  96   CO2 19 - 32 mEq/L 31  25  24    Calcium  8.4 - 10.5 mg/dL 9.7  9.1  9.4       Latest Ref Rng & Units 11/19/2023   11:24 AM 11/12/2023    5:37 AM 11/11/2023   10:13 AM  CMP  Glucose 70 - 99 mg/dL 885  822  865   BUN 6 - 23 mg/dL 7  11  12    Creatinine 0.40 - 1.20 mg/dL 9.24  9.01  8.98   Sodium 135 - 145 mEq/L 138  130  138   Potassium 3.5 - 5.1 mEq/L 4.1  3.3  2.7   Chloride 96 - 112 mEq/L 99  92  96   CO2 19 - 32 mEq/L 31  25  24    Calcium  8.4 - 10.5 mg/dL 9.7  9.1  9.4   Total Protein 6.0 - 8.3 g/dL 7.3   7.8   Total Bilirubin 0.2 - 1.2 mg/dL 0.5   0.9   Alkaline Phos 39 - 117 U/L 58   63   AST 0 - 37 U/L 34   32   ALT 0 - 35 U/L 38   23     Lab Results  Component Value Date   CHOL 208 (H) 11/14/2022   HDL 58.90 11/14/2022   LDLCALC 133 (H) 11/14/2022   LDLDIRECT 137.1 11/17/2008   TRIG 78.0 11/14/2022   CHOLHDL 4 11/14/2022   No results for input(s): LIPOA in the last 8760 hours. No components found for: NTPROBNP No results for input(s): PROBNP in the last 8760 hours. Recent Labs    11/11/23 1012  TSH 2.273    Physical Exam:    Today's Vitals   01/31/24 0823  BP: (!) 166/78  Pulse: 99  Resp: 16  SpO2: 95%  Weight: 177 lb 9.6 oz (80.6 kg)  Height: 5' 6 (1.676 m)   Body mass index is 28.67 kg/m. Wt Readings from Last 3 Encounters:  01/31/24 177 lb 9.6 oz (80.6 kg)  11/19/23 170 lb (77.1 kg)  11/12/23 167 lb 5.3 oz (75.9 kg)    Physical Exam  Constitutional: No distress.  hemodynamically stable  Neck: No JVD present.  Cardiovascular: Normal rate, regular rhythm, S1 normal and S2 normal. Exam reveals no gallop, no S3 and no S4.  No murmur heard. Pulmonary/Chest: Effort  normal and breath sounds normal. No stridor. She has no wheezes. She has no rales.  Musculoskeletal:        General: No edema.     Cervical back: Neck supple.  Skin: Skin is warm.     Impression &  Recommendation(s):  Impression:   ICD-10-CM   1. Paroxysmal atrial fibrillation (HCC)  I48.0 Hemoglobin and hematocrit, blood    Hemoglobin and hematocrit, blood    2. Long term (current) use of anticoagulants  Z79.01     3. Benign hypertension  I10 EKG 12-Lead    4. Mixed hyperlipidemia  E78.2     5. Prediabetes  R73.03        Recommendation(s):  Paroxysmal atrial fibrillation (HCC) Long term (current) use of anticoagulants Diagnosed in April 2025 during her hospitalization Spontaneously converted to sinus rhythm after being on Cardizem  drip Continue diltiazem  180 mg p.o. daily. Continue Eliquis  5 mg p.o. twice daily. Patient had concerns with regards to discontinuation of Eliquis  for thromboembolic prophylaxis.  Given her CHA2DS2-VASc score discussed the risks, benefits and alternatives to anticoagulation. It is in my judgment that the next best option would be to consider loop recorder implant for long-term monitoring of A-fib as opposed to just stopping anticoagulation without any follow-up.  Patient is aware that with the loop recorder implant she is subject to a small nominal fee for interrogation and follow-up device checks which could be looked into if she is interested. For now patient states that she will continue medical management and discuss it further with her family. Patient is aware to have her hemoglobin and renal function checked every 6 months. Risks, benefits, alternatives to anticoagulation discussed.  Benign hypertension Office blood pressures are not well-controlled Currently on triamterene  and hydrochlorothiazide 37.5 mg / 25 mg p.o. daily. Patient states also discussed with PCP with regards to medication adjustments. May benefit from discontinuation of  triamterene /HCTZ and considering other alternatives if she presented to the ED with a potassium of 2.7. Will defer to PCP  Mixed hyperlipidemia Currently on Zetia  10 mg p.o. daily. Lipids currently being managed by PCP.  Patient states that she has a yearly well visit with PCP in June/July.  She will have labs with PCP at that time and I will see her back in 6 months which is December every year and monitor her labs given the fact that she is on anticoagulation.  Orders Placed:  Orders Placed This Encounter  Procedures   Hemoglobin and hematocrit, blood    Standing Status:   Future    Number of Occurrences:   1    Expected Date:   07/06/2024    Expiration Date:   01/30/2025   EKG 12-Lead     Final Medication List:   No orders of the defined types were placed in this encounter.   There are no discontinued medications.   Current Outpatient Medications:    apixaban  (ELIQUIS ) 5 MG TABS tablet, Take 1 tablet (5 mg total) by mouth 2 (two) times daily., Disp: 60 tablet, Rfl: 3   diltiazem  (CARDIZEM  CD) 180 MG 24 hr capsule, Take 1 capsule (180 mg total) by mouth daily., Disp: 30 capsule, Rfl: 3   ezetimibe  (ZETIA ) 10 MG tablet, Take 1 tablet (10 mg total) by mouth daily., Disp: 30 tablet, Rfl: 3   triamterene -hydrochlorothiazide (MAXZIDE-25) 37.5-25 MG tablet, Take 1 tablet by mouth daily., Disp: 90 tablet, Rfl: 3  Consent:   NA  Disposition:   Follow-up in December 2025  Her questions and concerns were addressed to her satisfaction. She voices understanding of the recommendations provided during this encounter.    Signed, Madonna Large, DO, [facility]  A Division of [facility] [address], [place], [place] [address]  [place], [place]  [address] 01/31/2024 9:55 AM

## 2024-03-06 ENCOUNTER — Other Ambulatory Visit (HOSPITAL_COMMUNITY): Payer: Self-pay

## 2024-03-13 ENCOUNTER — Other Ambulatory Visit: Payer: Self-pay

## 2024-03-13 ENCOUNTER — Other Ambulatory Visit (HOSPITAL_COMMUNITY): Payer: Self-pay

## 2024-03-13 ENCOUNTER — Telehealth: Payer: Self-pay | Admitting: Cardiology

## 2024-03-13 MED ORDER — EZETIMIBE 10 MG PO TABS: 10.0000 mg | ORAL_TABLET | Freq: Every day | ORAL | 3 refills | Status: AC

## 2024-03-13 NOTE — Telephone Encounter (Signed)
*  STAT* If patient is at the pharmacy, call can be transferred to refill team.   1. Which medications need to be refilled? (please list name of each medication and dose if known) apixaban  (ELIQUIS ) 5 MG TABS tablet    diltiazem  (CARDIZEM  CD) 180 MG 24 hr capsule    ezetimibe  (ZETIA ) 10 MG tablet   2. Which pharmacy/location (including street and city if local pharmacy) is medication to be sent to? [id number] - [place], [place] - [address]   3. Do they need a 30 day or 90 day supply? 90

## 2024-03-20 ENCOUNTER — Other Ambulatory Visit (HOSPITAL_COMMUNITY): Payer: Self-pay

## 2024-04-07 ENCOUNTER — Other Ambulatory Visit: Payer: Self-pay | Admitting: Internal Medicine

## 2024-04-07 DIAGNOSIS — I10 Essential (primary) hypertension: Secondary | ICD-10-CM

## 2024-05-01 ENCOUNTER — Ambulatory Visit: Payer: Medicare Other

## 2024-05-01 VITALS — Ht 66.0 in | Wt 175.0 lb

## 2024-05-01 DIAGNOSIS — Z0000 Encounter for general adult medical examination without abnormal findings: Secondary | ICD-10-CM | POA: Diagnosis not present

## 2024-05-01 NOTE — Progress Notes (Signed)
 Subjective:   Kiara Holt is a 73 y.o. who presents for a Medicare Wellness preventive visit.  As a reminder, Annual Wellness Visits don't include a physical exam, and some assessments may be limited, especially if this visit is performed virtually. We may recommend an in-person follow-up visit with your provider if needed.  Visit Complete: Virtual I connected with  Kiara Holt on 05/01/24 by a audio enabled telemedicine application and verified that I am speaking with the correct person using two identifiers.  Patient Location: Home  Provider Location: Office/Clinic  I discussed the limitations of evaluation and management by telemedicine. The patient expressed understanding and agreed to proceed.  Vital Signs: Because this visit was a virtual/telehealth visit, some criteria may be missing or patient reported. Any vitals not documented were not able to be obtained and vitals that have been documented are patient reported.  VideoDeclined- This patient declined Librarian, academic. Therefore the visit was completed with audio only.  Persons Participating in Visit: Patient.  AWV Questionnaire: No: Patient Medicare AWV questionnaire was not completed prior to this visit.  Cardiac Risk Factors include: advanced age (>20men, >66 women);dyslipidemia;hypertension     Objective:    Today's Vitals   05/01/24 0808  Weight: 175 lb (79.4 kg)  Height: 5' 6 (1.676 m)   Body mass index is 28.25 kg/m.     05/01/2024    8:07 AM 11/11/2023   12:00 PM 04/29/2023    8:20 AM 04/27/2022    8:19 AM 04/20/2021    1:09 PM  Advanced Directives  Does Patient Have a Medical Advance Directive? Yes Yes Yes Yes Yes  Type of Estate agent of [place];Living will Healthcare Power of [place];Living will Healthcare Power of [place];Living will Healthcare Power of [place];Living will Living will;Healthcare Power of Attorney  Does patient want to  make changes to medical advance directive? No - Patient declined No - Patient declined No - Patient declined  No - Patient declined  Copy of Healthcare Power of Attorney in Chart? Yes - validated most recent copy scanned in chart (See row information) Yes - validated most recent copy scanned in chart (See row information) Yes - validated most recent copy scanned in chart (See row information) No - copy requested     Current Medications (verified) Outpatient Encounter Medications as of 05/01/2024  Medication Sig   apixaban  (ELIQUIS ) 5 MG TABS tablet Take 1 tablet (5 mg total) by mouth 2 (two) times daily.   diltiazem  (CARDIZEM  CD) 180 MG 24 hr capsule Take 1 capsule (180 mg total) by mouth daily.   ezetimibe  (ZETIA ) 10 MG tablet Take 1 tablet (10 mg total) by mouth daily.   triamterene -hydrochlorothiazide (MAXZIDE-25) 37.5-25 MG tablet Take 1 tablet by mouth daily.   No facility-administered encounter medications on file as of 05/01/2024.    Allergies (verified) Patient has no known allergies.   History: Past Medical History:  Diagnosis Date   Arthritis    shoulder   Hyperlipidemia    Hypertension    Past Surgical History:  Procedure Laterality Date   ABDOMINAL HYSTERECTOMY  1972   COLONOSCOPY     Family History  Problem Relation Age of Onset   Kidney disease Mother    Arthritis Mother    Diabetes Mother    Breast cancer Maternal Grandmother    Cancer Neg Hx    Heart disease Neg Hx    Hyperlipidemia Neg Hx    Hypertension Neg Hx  Stroke Neg Hx    Colon cancer Neg Hx    Colon polyps Neg Hx    Rectal cancer Neg Hx    Stomach cancer Neg Hx    Social History   Socioeconomic History   Marital status: Married    Spouse name: Octaviano   Number of children: Not on file   Years of education: Not on file   Highest education level: Not on file  Occupational History   Occupation: Retired  Tobacco Use   Smoking status: Never   Smokeless tobacco: Never  Vaping Use   Vaping  status: Never Used  Substance and Sexual Activity   Alcohol use: No   Drug use: No   Sexual activity: Yes  Other Topics Concern   Not on file  Social History Narrative   Lives with husband.   Social Drivers of Corporate investment banker Strain: Low Risk  (05/01/2024)   Overall Financial Resource Strain (CARDIA)    Difficulty of Paying Living Expenses: Not hard at all  Food Insecurity: No Food Insecurity (05/01/2024)   Hunger Vital Sign    Worried About Running Out of Food in the Last Year: Never true    Ran Out of Food in the Last Year: Never true  Transportation Needs: No Transportation Needs (05/01/2024)   PRAPARE - Administrator, Civil Service (Medical): No    Lack of Transportation (Non-Medical): No  Physical Activity: Sufficiently Active (05/01/2024)   Exercise Vital Sign    Days of Exercise per Week: 3 days    Minutes of Exercise per Session: 60 min  Stress: No Stress Concern Present (05/01/2024)   [employer] of Occupational Health - Occupational Stress Questionnaire    Feeling of Stress: Not at all  Social Connections: Moderately Isolated (05/01/2024)   Social Connection and Isolation Panel    Frequency of Communication with Friends and Family: More than three times a week    Frequency of Social Gatherings with Friends and Family: Twice a week    Attends Religious Services: Never    Database administrator or Organizations: No    Attends Engineer, structural: Never    Marital Status: Married    Tobacco Counseling Counseling given: Not Answered    Clinical Intake:  Pre-visit preparation completed: Yes  Pain : No/denies pain     BMI - recorded: 28.25 Nutritional Status: BMI 25 -29 Overweight Nutritional Risks: None Diabetes: No  Lab Results  Component Value Date   HGBA1C 5.8 (H) 11/11/2023   HGBA1C 6.1 11/14/2022   HGBA1C 6.2 11/28/2021     How often do you need to have someone help you when you read instructions, pamphlets,  or other written materials from your doctor or pharmacy?: 1 - Never  Interpreter Needed?: No  Information entered by :: Verdie Saba, CMA   Activities of Daily Living     05/01/2024    8:11 AM 11/11/2023   12:00 PM  In your present state of health, do you have any difficulty performing the following activities:  Hearing? 0 0  Vision? 0 0  Difficulty concentrating or making decisions? 0 0  Walking or climbing stairs? 0   Dressing or bathing? 0   Doing errands, shopping? 0 0  Preparing Food and eating ? N   Using the Toilet? N   In the past six months, have you accidently leaked urine? N   Do you have problems with loss of bowel control? N  Managing your Medications? N   Managing your Finances? N   Housekeeping or managing your Housekeeping? N     Patient Care Team: Joshua Debby CROME, MD as PCP - General (Internal Medicine) Michele Richardson, DO as PCP - Cardiology (Cardiology)  I have updated your Care Teams any recent Medical Services you may have received from other providers in the past year.     Assessment:   This is a routine wellness examination for Shaelynn.  Hearing/Vision screen No results found.   Goals Addressed               This Visit's Progress     Patient Stated (pt-stated)        Patient stated she plans to continue to exercise       Depression Screen     05/01/2024    8:12 AM 04/29/2023    8:30 AM 04/27/2022    8:18 AM 04/20/2021    1:13 PM 11/26/2019    2:17 PM 10/21/2017   12:53 PM  PHQ 2/9 Scores  PHQ - 2 Score 0 0 0 0 0 0  PHQ- 9 Score 0 0        Fall Risk     05/01/2024    8:11 AM 11/19/2023   10:57 AM 04/29/2023    8:24 AM 04/27/2022    8:16 AM 04/20/2021    1:12 PM  Fall Risk   Falls in the past year? 0 0 0 0 0  Number falls in past yr: 0 0 0 0 0  Injury with Fall? 0 0 0 0 0  Risk for fall due to : No Fall Risks  No Fall Risks No Fall Risks No Fall Risks  Follow up Falls evaluation completed;Falls prevention discussed Falls evaluation  completed Falls prevention discussed;Falls evaluation completed Falls prevention discussed  Falls evaluation completed      Data saved with a previous flowsheet row definition    MEDICARE RISK AT HOME:  Medicare Risk at Home Any stairs in or around the home?: No If so, are there any without handrails?: No Home free of loose throw rugs in walkways, pet beds, electrical cords, etc?: Yes Adequate lighting in your home to reduce risk of falls?: Yes Life alert?: No Use of a cane, walker or w/c?: No Grab bars in the bathroom?: No Shower chair or bench in shower?: No Elevated toilet seat or a handicapped toilet?: No  TIMED UP AND GO:  Was the test performed?  No  Cognitive Function: 6CIT completed        05/01/2024    8:14 AM 04/29/2023    8:25 AM 04/27/2022    8:20 AM  6CIT Screen  What Year? 0 points 0 points 0 points  What month? 0 points 0 points 0 points  What time? 0 points 0 points 0 points  Count back from 20 0 points 0 points 0 points  Months in reverse 0 points 4 points 0 points  Repeat phrase 0 points 4 points 0 points  Total Score 0 points 8 points 0 points    Immunizations Immunization History  Administered Date(s) Administered   Tdap 02/23/2010    Screening Tests Health Maintenance  Topic Date Due   COVID-19 Vaccine (1 - 2024-25 season) Never done   Zoster Vaccines- Shingrix  (1 of 2) 07/31/2024 (Originally 01/22/2003)   Influenza Vaccine  11/03/2024 (Originally 03/06/2024)   Pneumococcal Vaccine: 50+ Years (1 of 1 - PCV) 05/01/2025 (Originally 01/22/2003)   Colonoscopy  05/01/2025 (  Originally 09/24/2023)   Mammogram  10/22/2024   Medicare Annual Wellness (AWV)  05/01/2025   DEXA SCAN  Completed   Hepatitis C Screening  Completed   HPV VACCINES  Aged Out   Meningococcal B Vaccine  Aged Out   DTaP/Tdap/Td  Discontinued    Health Maintenance Items Addressed:  I have recommended that this patient have a immunization for Influenza, Pneumonia, and Shingles and  Colonoscopy or a Cologuard but she declines at this time. I have discussed the risks and benefits of this procedure with her. The patient verbalizes understanding.   Additional Screening:  Vision Screening: Recommended annual ophthalmology exams for early detection of glaucoma and other disorders of the eye. Is the patient up to date with their annual eye exam?  Yes  Who is the provider or what is the name of the office in which the patient attends annual eye exams? [facility]  Dental Screening: Recommended annual dental exams for proper oral hygiene  Community Resource Referral / Chronic Care Management: CRR required this visit?  No   CCM required this visit?  No   Plan:    I have personally reviewed and noted the following in the patient's chart:   Medical and social history Use of alcohol, tobacco or illicit drugs  Current medications and supplements including opioid prescriptions. Patient is not currently taking opioid prescriptions. Functional ability and status Nutritional status Physical activity Advanced directives List of other physicians Hospitalizations, surgeries, and ER visits in previous 12 months Vitals Screenings to include cognitive, depression, and falls Referrals and appointments  In addition, I have reviewed and discussed with patient certain preventive protocols, quality metrics, and best practice recommendations. A written personalized care plan for preventive services as well as general preventive health recommendations were provided to patient.   Verdie CHRISTELLA Saba, CMA   05/01/2024   After Visit Summary: (MyChart) Due to this being a telephonic visit, the after visit summary with patients personalized plan was offered to patient via MyChart   Notes: Scheduled a 6-mth f/u w/PCP.

## 2024-05-01 NOTE — Patient Instructions (Addendum)
 Kiara Holt,  Thank you for taking the time for your Medicare Wellness Visit. I appreciate your continued commitment to your health goals. Please review the care plan we discussed, and feel free to reach out if I can assist you further.  Medicare recommends these wellness visits once per year to help you and your care team stay ahead of potential health issues. These visits are designed to focus on prevention, allowing your provider to concentrate on managing your acute and chronic conditions during your regular appointments.  Please note that Annual Wellness Visits do not include a physical exam. Some assessments may be limited, especially if the visit was conducted virtually. If needed, we may recommend a separate in-person follow-up with your provider.  Ongoing Care Seeing your primary care provider every 3 to 6 months helps us  monitor your health and provide consistent, personalized care  Referrals If a referral was made during today's visit and you haven't received any updates within two weeks, please contact the referred provider directly to check on the status.  Recommended Screenings:  Health Maintenance  Topic Date Due   COVID-19 Vaccine (1 - 2024-25 season) Never done   Zoster (Shingles) Vaccine (1 of 2) 07/31/2024*   Flu Shot  11/03/2024*   Pneumococcal Vaccine for age over 3 (1 of 1 - PCV) 05/01/2025*   Colon Cancer Screening  05/01/2025*   Breast Cancer Screening  10/22/2024   Medicare Annual Wellness Visit  05/01/2025   DEXA scan (bone density measurement)  Completed   Hepatitis C Screening  Completed   HPV Vaccine  Aged Out   Meningitis B Vaccine  Aged Out   DTaP/Tdap/Td vaccine  Discontinued  *Topic was postponed. The date shown is not the original due date.       05/01/2024    8:07 AM  Advanced Directives  Does Patient Have a Medical Advance Directive? Yes  Type of Estate agent of [place];Living will  Does patient want to make changes to  medical advance directive? No - Patient declined  Copy of Healthcare Power of Attorney in Chart? Yes - validated most recent copy scanned in chart (See row information)   Advance Care Planning is important because it: Ensures you receive medical care that aligns with your values, goals, and preferences. Provides guidance to your family and loved ones, reducing the emotional burden of decision-making during critical moments.  Vision: Annual vision screenings are recommended for early detection of glaucoma, cataracts, and diabetic retinopathy. These exams can also reveal signs of chronic conditions such as diabetes and high blood pressure.  Dental: Annual dental screenings help detect early signs of oral cancer, gum disease, and other conditions linked to overall health, including heart disease and diabetes.

## 2024-06-04 ENCOUNTER — Ambulatory Visit: Payer: Self-pay | Admitting: Internal Medicine

## 2024-06-04 ENCOUNTER — Encounter: Payer: Self-pay | Admitting: Internal Medicine

## 2024-06-04 ENCOUNTER — Ambulatory Visit: Admitting: Internal Medicine

## 2024-06-04 VITALS — BP 148/72 | HR 90 | Temp 98.6°F | Resp 16 | Ht 66.0 in | Wt 174.8 lb

## 2024-06-04 DIAGNOSIS — E785 Hyperlipidemia, unspecified: Secondary | ICD-10-CM | POA: Diagnosis not present

## 2024-06-04 DIAGNOSIS — Z0000 Encounter for general adult medical examination without abnormal findings: Secondary | ICD-10-CM | POA: Diagnosis not present

## 2024-06-04 DIAGNOSIS — K2100 Gastro-esophageal reflux disease with esophagitis, without bleeding: Secondary | ICD-10-CM | POA: Diagnosis not present

## 2024-06-04 DIAGNOSIS — Z0001 Encounter for general adult medical examination with abnormal findings: Secondary | ICD-10-CM | POA: Insufficient documentation

## 2024-06-04 DIAGNOSIS — I10 Essential (primary) hypertension: Secondary | ICD-10-CM

## 2024-06-04 DIAGNOSIS — R7303 Prediabetes: Secondary | ICD-10-CM | POA: Diagnosis not present

## 2024-06-04 LAB — URINALYSIS, ROUTINE W REFLEX MICROSCOPIC
Bilirubin Urine: NEGATIVE
Hgb urine dipstick: NEGATIVE
Ketones, ur: NEGATIVE
Nitrite: NEGATIVE
RBC / HPF: NONE SEEN (ref 0–?)
Specific Gravity, Urine: 1.015 (ref 1.000–1.030)
Total Protein, Urine: NEGATIVE
Urine Glucose: NEGATIVE
Urobilinogen, UA: 0.2 (ref 0.0–1.0)
pH: 8 (ref 5.0–8.0)

## 2024-06-04 LAB — CBC WITH DIFFERENTIAL/PLATELET
Basophils Absolute: 0 K/uL (ref 0.0–0.1)
Basophils Relative: 0.6 % (ref 0.0–3.0)
Eosinophils Absolute: 0 K/uL (ref 0.0–0.7)
Eosinophils Relative: 1 % (ref 0.0–5.0)
HCT: 39.9 % (ref 36.0–46.0)
Hemoglobin: 13 g/dL (ref 12.0–15.0)
Lymphocytes Relative: 28.9 % (ref 12.0–46.0)
Lymphs Abs: 1.3 K/uL (ref 0.7–4.0)
MCHC: 32.7 g/dL (ref 30.0–36.0)
MCV: 83.6 fl (ref 78.0–100.0)
Monocytes Absolute: 0.5 K/uL (ref 0.1–1.0)
Monocytes Relative: 12.6 % — ABNORMAL HIGH (ref 3.0–12.0)
Neutro Abs: 2.5 K/uL (ref 1.4–7.7)
Neutrophils Relative %: 56.9 % (ref 43.0–77.0)
Platelets: 219 K/uL (ref 150.0–400.0)
RBC: 4.77 Mil/uL (ref 3.87–5.11)
RDW: 15.2 % (ref 11.5–15.5)
WBC: 4.3 K/uL (ref 4.0–10.5)

## 2024-06-04 LAB — BASIC METABOLIC PANEL WITH GFR
BUN: 12 mg/dL (ref 6–23)
CO2: 30 meq/L (ref 19–32)
Calcium: 9.8 mg/dL (ref 8.4–10.5)
Chloride: 96 meq/L (ref 96–112)
Creatinine, Ser: 0.75 mg/dL (ref 0.40–1.20)
GFR: 80.13 mL/min (ref >60.00)
Glucose, Bld: 142 mg/dL — ABNORMAL HIGH (ref 70–99)
Potassium: 3.9 meq/L (ref 3.5–5.1)
Sodium: 136 meq/L (ref 135–145)

## 2024-06-04 LAB — TSH: TSH: 2.16 u[IU]/mL (ref 0.35–5.50)

## 2024-06-04 LAB — LIPID PANEL
Cholesterol: 178 mg/dL (ref 0–200)
HDL: 59.1 mg/dL (ref >39.00)
LDL Cholesterol: 106 mg/dL — ABNORMAL HIGH (ref 0–99)
NonHDL: 118.76
Total CHOL/HDL Ratio: 3
Triglycerides: 62 mg/dL (ref 0.0–149.0)
VLDL: 12.4 mg/dL (ref 0.0–40.0)

## 2024-06-04 LAB — HEMOGLOBIN A1C: Hgb A1c MFr Bld: 6.4 % (ref 4.6–6.5)

## 2024-06-04 NOTE — Progress Notes (Unsigned)
 Subjective:  Patient ID: Kiara Holt, female    DOB: 12-27-1950  Age: 73 y.o. MRN: [id number]  CC: Hypertension, Annual Exam, and Hyperlipidemia   HPI Kiara Holt presents for a CPX and f/up ---  Discussed the use of AI scribe software for clinical note transcription with the patient, who gave verbal consent to proceed.  History of Present Illness Kiara Holt is a 73 year old female who presents for follow-up regarding her cardiovascular health.  She recently saw a cardiologist and is scheduled to return in December for lab work. Her last visit was possibly in June, during which she states everything was fine.  She denies experiencing any symptoms of hypertension. She reports feeling nervous upon arrival, which she believes may have affected her blood pressure. She engaged in activities such as washing and cleaning before the appointment. She experiences 'white coat' hypertension but notes this does not occur when visiting the cardiologist.  No symptoms of headache, blurred vision, chest pain, shortness of breath, dizziness, or lightheadedness. She regularly exercises by going to the gym three days a week, where she walks two miles and uses weight machines. No chest pain or shortness of breath during these activities.  No swelling in her legs or feet and no side effects from her current medications. She is not currently taking any medication for high blood pressure but mentions taking two unspecified pills.  No history of atrial fibrillation or irregular heartbeat. She states, 'my heart, I got a good heart now,' and does not feel her heart beating irregularly.     Outpatient Medications Prior to Visit  Medication Sig Dispense Refill   ezetimibe  (ZETIA ) 10 MG tablet Take 1 tablet (10 mg total) by mouth daily. 90 tablet 3   triamterene -hydrochlorothiazide (MAXZIDE-25) 37.5-25 MG tablet Take 1 tablet by mouth daily. 90 tablet 3   apixaban  (ELIQUIS ) 5 MG TABS tablet Take 1  tablet (5 mg total) by mouth 2 (two) times daily. (Patient not taking: Reported on 06/04/2024) 60 tablet 3   diltiazem  (CARDIZEM  CD) 180 MG 24 hr capsule Take 1 capsule (180 mg total) by mouth daily. (Patient not taking: Reported on 06/04/2024) 30 capsule 3   No facility-administered medications prior to visit.    ROS Review of Systems  Objective:  BP (!) 148/72 (BP Location: Left Arm, Patient Position: Sitting, Cuff Size: Normal)   Pulse 90   Temp 98.6 F (37 C) (Oral)   Resp 16   Ht 5' 6 (1.676 m)   Wt 174 lb 12.8 oz (79.3 kg)   SpO2 97%   BMI 28.21 kg/m   BP Readings from Last 3 Encounters:  06/04/24 (!) 148/72  01/31/24 (!) 166/78  11/19/23 (!) 160/80    Wt Readings from Last 3 Encounters:  06/04/24 174 lb 12.8 oz (79.3 kg)  05/01/24 175 lb (79.4 kg)  01/31/24 177 lb 9.6 oz (80.6 kg)    Physical Exam Cardiovascular:     Rate and Rhythm: Normal rate and regular rhythm. Extrasystoles are present.    Lab Results  Component Value Date   WBC 4.3 06/04/2024   HGB 13.0 06/04/2024   HCT 39.9 06/04/2024   PLT 219.0 06/04/2024   GLUCOSE 142 (H) 06/04/2024   CHOL 178 06/04/2024   TRIG 62.0 06/04/2024   HDL 59.10 06/04/2024   LDLDIRECT 137.1 11/17/2008   LDLCALC 106 (H) 06/04/2024   ALT 38 (H) 11/19/2023   AST 34 11/19/2023   NA 136 06/04/2024   K  3.9 06/04/2024   CL 96 06/04/2024   CREATININE 0.75 06/04/2024   BUN 12 06/04/2024   CO2 30 06/04/2024   TSH 2.16 06/04/2024   HGBA1C 6.4 06/04/2024    ECHOCARDIOGRAM COMPLETE Result Date: 11/12/2023    ECHOCARDIOGRAM REPORT   Patient Name:   Kiara Holt Date of Exam: 11/12/2023 Medical Rec #:  [id number]        Height:       66.0 in Accession #:    [id number]       Weight:       167.3 lb Date of Birth:  01/19/1951        BSA:          1.854 m Patient Age:    72 years         BP:           136/77 mmHg Patient Gender: F                HR:           85 bpm. Exam Location:  Inpatient Procedure: 2D Echo, Cardiac Doppler  and Color Doppler (Both Spectral and Color            Flow Doppler were utilized during procedure). Indications:    Abn ECG  History:        Patient has no prior history of Echocardiogram examinations.                 Arrythmias:Atrial Fibrillation; Risk Factors:Dyslipidemia and                 Hypertension.  Sonographer:    Carmelita Hartshorn RDCS, FE, PE Referring Phys: [id number] SUNIT TOLIA IMPRESSIONS  1. Left ventricular ejection fraction, by estimation, is 55 to 60%. The left ventricle has normal function. The left ventricle has no regional wall motion abnormalities. Left ventricular diastolic parameters are consistent with Grade I diastolic dysfunction (impaired relaxation).  2. Right ventricular systolic function is normal. The right ventricular size is normal.  3. The mitral valve is grossly normal. Trivial mitral valve regurgitation.  4. The aortic valve is tricuspid. Aortic valve regurgitation is not visualized. Aortic valve sclerosis is present, with no evidence of aortic valve stenosis.  5. The inferior vena cava is normal in size with greater than 50% respiratory variability, suggesting right atrial pressure of 3 mmHg. Comparison(s): No prior Echocardiogram. FINDINGS  Left Ventricle: Left ventricular ejection fraction, by estimation, is 55 to 60%. The left ventricle has normal function. The left ventricle has no regional wall motion abnormalities. Strain was performed and the global longitudinal strain is indeterminate. The left ventricular internal cavity size was normal in size. There is no left ventricular hypertrophy. Left ventricular diastolic parameters are consistent with Grade I diastolic dysfunction (impaired relaxation). Right Ventricle: The right ventricular size is normal. No increase in right ventricular wall thickness. Right ventricular systolic function is normal. Left Atrium: Left atrial size was normal in size. Right Atrium: Right atrial size was normal in size. Pericardium: Trivial  pericardial effusion is present. The pericardial effusion is posterior to the left ventricle. Mitral Valve: The mitral valve is grossly normal. Trivial mitral valve regurgitation. Tricuspid Valve: The tricuspid valve is normal in structure. Tricuspid valve regurgitation is trivial. No evidence of tricuspid stenosis. Aortic Valve: The aortic valve is tricuspid. Aortic valve regurgitation is not visualized. Aortic valve sclerosis is present, with no evidence of aortic valve stenosis. Aortic valve mean gradient measures 4.0 mmHg. Aortic valve  peak gradient measures 7.6  mmHg. Aortic valve area, by VTI measures 2.16 cm. Pulmonic Valve: The pulmonic valve was normal in structure. Pulmonic valve regurgitation is not visualized. No evidence of pulmonic stenosis. Aorta: The aortic root is normal in size and structure. Venous: The inferior vena cava is normal in size with greater than 50% respiratory variability, suggesting right atrial pressure of 3 mmHg. IAS/Shunts: The atrial septum is grossly normal. Additional Comments: 3D was performed not requiring image post processing on an independent workstation and was indeterminate.  LEFT VENTRICLE PLAX 2D LVIDd:         4.50 cm   Diastology LVIDs:         3.20 cm   LV e' medial:    6.53 cm/s LV PW:         0.80 cm   LV E/e' medial:  11.6 LV IVS:        0.80 cm   LV e' lateral:   9.64 cm/s LVOT diam:     2.00 cm   LV E/e' lateral: 7.9 LV SV:         54 LV SV Index:   29 LVOT Area:     3.14 cm  RIGHT VENTRICLE RV Basal diam:  3.50 cm RV Mid diam:    2.60 cm RV S prime:     12.60 cm/s TAPSE (M-mode): 1.5 cm LEFT ATRIUM             Index        RIGHT ATRIUM           Index LA diam:        2.90 cm 1.56 cm/m   RA Area:     12.90 cm LA Vol (A2C):   53.6 ml 28.91 ml/m  RA Volume:   27.20 ml  14.67 ml/m LA Vol (A4C):   40.1 ml 21.63 ml/m LA Biplane Vol: 48.7 ml 26.27 ml/m  AORTIC VALVE AV Area (Vmax):    2.19 cm AV Area (Vmean):   2.07 cm AV Area (VTI):     2.16 cm AV Vmax:            138.00 cm/s AV Vmean:          94.600 cm/s AV VTI:            0.252 m AV Peak Grad:      7.6 mmHg AV Mean Grad:      4.0 mmHg LVOT Vmax:         96.30 cm/s LVOT Vmean:        62.400 cm/s LVOT VTI:          0.173 m LVOT/AV VTI ratio: 0.69  AORTA Ao Root diam: 2.70 cm MITRAL VALVE               TRICUSPID VALVE MV Area (PHT): 4.39 cm    TR Peak grad:   23.0 mmHg MV Decel Time: 173 msec    TR Vmax:        240.00 cm/s MV E velocity: 76.00 cm/s MV A velocity: 72.70 cm/s  SHUNTS MV E/A ratio:  1.05        Systemic VTI:  0.17 m                            Systemic Diam: 2.00 cm Stanly Leavens MD Electronically signed by Stanly Leavens MD Signature Date/Time: 11/12/2023/3:23:47 PM    Final  DG Chest Port 1 View Result Date: 11/11/2023 CLINICAL DATA:  A rhythm E a EXAM: PORTABLE CHEST 1 VIEW COMPARISON:  Chest radiograph dated 03/17/2018 FINDINGS: Patient is rotated to the right. Normal lung volumes. No focal consolidations. No pleural effusion or pneumothorax. The heart size and mediastinal contours are within normal limits. No acute osseous abnormality. IMPRESSION: No acute disease. Electronically Signed   By: Limin  Xu M.D.   On: 11/11/2023 11:06    Assessment & Plan:  Prediabetes -     Hemoglobin A1c; Future -     Basic metabolic panel with GFR; Future  Hyperlipidemia LDL goal <130 -     Lipid panel; Future -     TSH; Future  Gastroesophageal reflux disease with esophagitis without hemorrhage -     CBC with Differential/Platelet; Future  Benign hypertension -     Basic metabolic panel with GFR; Future -     Urinalysis, Routine w reflex microscopic; Future -     TSH; Future  Encounter for general adult medical examination with abnormal findings     Follow-up: Return in about 6 months (around 12/03/2024).  Debby Molt, MD

## 2024-06-04 NOTE — Patient Instructions (Signed)

## 2024-07-08 LAB — HEMOGLOBIN AND HEMATOCRIT, BLOOD
Hematocrit: 41.8 % (ref 34.0–46.6)
Hemoglobin: 13.4 g/dL (ref 11.1–15.9)

## 2024-07-10 ENCOUNTER — Ambulatory Visit: Admitting: Cardiology

## 2024-07-15 ENCOUNTER — Ambulatory Visit: Payer: Self-pay | Admitting: Cardiology

## 2024-07-16 NOTE — Telephone Encounter (Signed)
-----   Message from [place], [place] sent at 07/15/2024  1:04 AM EST ----- [name]  Hemoglobin at baseline.  Dr. Michele ----- Message ----- From: Interface, [facility] Lab Results In Sent: 07/08/2024   7:39 AM EST To: Madonna Michele, DO

## 2024-08-13 ENCOUNTER — Encounter: Payer: Self-pay | Admitting: Cardiology

## 2024-08-13 ENCOUNTER — Ambulatory Visit: Attending: Cardiology | Admitting: Cardiology

## 2024-08-13 VITALS — BP 130/70 | HR 53 | Resp 16 | Ht 66.0 in | Wt 175.0 lb

## 2024-08-13 DIAGNOSIS — I10 Essential (primary) hypertension: Secondary | ICD-10-CM | POA: Insufficient documentation

## 2024-08-13 DIAGNOSIS — I480 Paroxysmal atrial fibrillation: Secondary | ICD-10-CM | POA: Diagnosis present

## 2024-08-13 DIAGNOSIS — E782 Mixed hyperlipidemia: Secondary | ICD-10-CM | POA: Diagnosis present

## 2024-08-13 DIAGNOSIS — Z7901 Long term (current) use of anticoagulants: Secondary | ICD-10-CM | POA: Insufficient documentation

## 2024-08-13 NOTE — Patient Instructions (Signed)
 Medication Instructions:  Your physician recommends that you continue on your current medications as directed. Please refer to the Current Medication list given to you today.  *If you need a refill on your cardiac medications before your next appointment, please call your pharmacy*  Lab Work: None ordered If you have labs (blood work) drawn today and your tests are completely normal, you will receive your results only by: MyChart Message (if you have MyChart) OR A paper copy in the mail If you have any lab test that is abnormal or we need to change your treatment, we will call you to review the results.  Testing/Procedures: None ordered  Follow-Up: At [facility], you and your health needs are our priority.  As part of our continuing mission to provide you with exceptional heart care, our providers are all part of one team.  This team includes your primary Cardiologist (physician) and Advanced Practice Providers or APPs (Physician Assistants and Nurse Practitioners) who all work together to provide you with the care you need, when you need it.  Your next appointment:   1 year(s) or As Needed  Provider:   Madonna Large, DO    We recommend signing up for the patient portal called MyChart.  Sign up information is provided on this After Visit Summary.  MyChart is used to connect with patients for Virtual Visits (Telemedicine).  Patients are able to view lab/test results, encounter notes, upcoming appointments, etc.  Non-urgent messages can be sent to your provider as well.   To learn more about what you can do with MyChart, go to [email or web address].

## 2024-08-13 NOTE — Progress Notes (Signed)
 " Cardiology Office Note:  .   Date:  08/13/2024  ID:  Kiara Holt, DOB 1951-06-27, MRN [id number] PCP:  Kiara Debby CROME, MD  Former Cardiology Providers: NA [facility] Providers Cardiologist:  Madonna Large, DO , [facility] (established care 11/11/2023) Electrophysiologist:  None  Click to update primary MD,subspecialty MD or APP then REFRESH:1}    Chief Complaint  Patient presents with   Atrial Fibrillation   Follow-up    History of Present Illness: .   Kiara Holt is a 74 y.o. African-American female whose past medical history and cardiovascular risk factors includes: Hypertension, prediabetes, hyperlipidemia, atrial fibrillation.  Patient was seen in consult in April 2025 at [facility].  She was scheduled for outpatient colonoscopy and was found to be hypertensive with SBP 190 mmHg and tachycardic.  She did not take her morning antihypertensive medications due to the colonoscopy due to these variables patient's colonoscopy was canceled and she was sent to ED for further evaluation and management.  In the emergency room department EKG noted A-fib with RVR cardiology consulted for further recommendations.  Patient was started on a Cardizem  drip and spontaneously converted to sinus rhythm.  Prescriptions were changed to reflect the new diagnosis of A-fib.    During her last office visit in June 2025 patient voiced she wants to discontinue Eliquis  for thromboembolic prophylaxis.  Given her CHA2DS2-VASc score recommended loop recorder implant for long-term monitoring of A-fib burden.  Such that if she has reoccurrence of A-fib anticoagulation can be restarted.  Patient was going to discuss this further with family prior to making an informed decision.  Based on her medication list today she is no longer on Cardizem  or Eliquis . Patient states that I am good and my heart is good She does not want to be monitored with an implantable loop recorder for A-fib reoccurrence.   Patient states this she understands her body and if she feels different she will be reevaluated.  Patient understands that having paroxysmal A-fib in the past places her at a high risk for reoccurrence and possible stroke.  She walks 2 miles 3 times a week on a treadmill at the gym.  Review of Systems: .   Review of Systems  Cardiovascular:  Negative for chest pain, claudication, irregular heartbeat, leg swelling, near-syncope, orthopnea, palpitations, paroxysmal nocturnal dyspnea and syncope.  Respiratory:  Negative for shortness of breath.   Hematologic/Lymphatic: Negative for bleeding problem.    Studies Reviewed:   EKG: EKG Interpretation Date/Time:  Thursday August 13 2024 08:49:39 EST Ventricular Rate:  90 PR Interval:  142 QRS Duration:  68 QT Interval:  344 QTC Calculation: 420 R Axis:   61  Text Interpretation: Sinus rhythm with Premature atrial complexes Nonspecific ST abnormality When compared with ECG of 31-Jan-2024 08:27, No significant change was found Confirmed by Large Madonna [phone]) on 08/13/2024 8:54:43 AM  Echocardiogram: 11/2023  1. Left ventricular ejection fraction, by estimation, is 55 to 60%. The  left ventricle has normal function. The left ventricle has no regional wall motion abnormalities. Left ventricular diastolic parameters are consistent with Grade I diastolic  dysfunction (impaired relaxation).   2. Right ventricular systolic function is normal. The right ventricular size is normal.   3. The mitral valve is grossly normal. Trivial mitral valve  regurgitation.   4. The aortic valve is tricuspid. Aortic valve regurgitation is not visualized. Aortic valve sclerosis is present, with no evidence of aortic valve stenosis.   5. The inferior vena  cava is normal in size with greater than 50% respiratory variability, suggesting right atrial pressure of 3 mmHg.   Stress Testing: NA  Cardiac monitor: NA  RADIOLOGY: NA  Risk Assessment/Calculations:    Click Here to Calculate/Change CHADS2VASc Score The patient's CHADS2-VASc score is 3, indicating a 3.2% annual risk of stroke.    Labs:       Latest Ref Rng & Units 07/07/2024    8:43 AM 06/04/2024    9:38 AM 11/19/2023   11:24 AM  CBC  WBC 4.0 - 10.5 K/uL  4.3  4.9   Hemoglobin 11.1 - 15.9 g/dL 86.5  86.9  88.0   Hematocrit 34.0 - 46.6 % 41.8  39.9  36.5   Platelets 150.0 - 400.0 K/uL  219.0  253.0        Latest Ref Rng & Units 06/04/2024    9:38 AM 11/19/2023   11:24 AM 11/12/2023    5:37 AM  BMP  Glucose 70 - 99 mg/dL 857  885  822   BUN 6 - 23 mg/dL 12  7  11    Creatinine 0.40 - 1.20 mg/dL 9.24  9.24  9.01   Sodium 135 - 145 mEq/L 136  138  130   Potassium 3.5 - 5.1 mEq/L 3.9  4.1  3.3   Chloride 96 - 112 mEq/L 96  99  92   CO2 19 - 32 mEq/L 30  31  25    Calcium  8.4 - 10.5 mg/dL 9.8  9.7  9.1       Latest Ref Rng & Units 06/04/2024    9:38 AM 11/19/2023   11:24 AM 11/12/2023    5:37 AM  CMP  Glucose 70 - 99 mg/dL 857  885  822   BUN 6 - 23 mg/dL 12  7  11    Creatinine 0.40 - 1.20 mg/dL 9.24  9.24  9.01   Sodium 135 - 145 mEq/L 136  138  130   Potassium 3.5 - 5.1 mEq/L 3.9  4.1  3.3   Chloride 96 - 112 mEq/L 96  99  92   CO2 19 - 32 mEq/L 30  31  25    Calcium  8.4 - 10.5 mg/dL 9.8  9.7  9.1   Total Protein 6.0 - 8.3 g/dL  7.3    Total Bilirubin 0.2 - 1.2 mg/dL  0.5    Alkaline Phos 39 - 117 U/L  58    AST 0 - 37 U/L  34    ALT 0 - 35 U/L  38      Lab Results  Component Value Date   CHOL 178 06/04/2024   HDL 59.10 06/04/2024   LDLCALC 106 (H) 06/04/2024   LDLDIRECT 137.1 11/17/2008   TRIG 62.0 06/04/2024   CHOLHDL 3 06/04/2024   No results for input(s): LIPOA in the last 8760 hours. No components found for: NTPROBNP No results for input(s): PROBNP in the last 8760 hours. Recent Labs    11/11/23 1012 06/04/24 0938  TSH 2.273 2.16    Physical Exam:    Today's Vitals   08/13/24 0846  BP: 130/70  Pulse: (!) 53  Resp: 16  SpO2: 92%  Weight:  175 lb (79.4 kg)  Height: 5' 6 (1.676 m)   Body mass index is 28.25 kg/m. Wt Readings from Last 3 Encounters:  08/13/24 175 lb (79.4 kg)  06/04/24 174 lb 12.8 oz (79.3 kg)  05/01/24 175 lb (79.4 kg)    Physical Exam  Constitutional:  No distress.  hemodynamically stable  Neck: No JVD present.  Cardiovascular: Regular rhythm, S1 normal and S2 normal. Bradycardia present. Exam reveals no gallop, no S3 and no S4.  No murmur heard. Pulmonary/Chest: Effort normal and breath sounds normal. No stridor. She has no wheezes. She has no rales.  Musculoskeletal:        General: No edema.     Cervical back: Neck supple.  Skin: Skin is warm.     Impression & Recommendation(s):  Impression:   ICD-10-CM   1. Paroxysmal atrial fibrillation (HCC)  I48.0 EKG 12-Lead    2. Long term (current) use of anticoagulants  Z79.01     3. Benign hypertension  I10     4. Mixed hyperlipidemia  E78.2        Recommendation(s):  Paroxysmal atrial fibrillation (HCC) Long term (current) use of anticoagulants Diagnosed in April 2025 during her hospitalization Spontaneously converted to sinus rhythm after being on Cardizem  drip Since last office visit has discontinued Eliquis  and Cardizem . She was offered to have a implantable loop recorder placed for long-term monitoring for A-fib.  And if she has reoccurrence of A-fib medications can be restarted.  However, patient refuses to have an ILR placed. I have asked her to be more cognizant of symptoms of stroke and if present should go to the closest ER via EMS.  Benign hypertension Office blood pressures are well-controlled Blood pressure medications are currently being managed by PCP. In the past I had recommended discontinuing triamterene /hydrochlorothiazide and considering alternative antihypertensive medications given the fact that she had hypokalemia with a potassium level of 2.7 in the past she was going to discuss this further with PCP.  However, she  remains on triamterene /hydrochlorothiazide 37.5/25 mg p.o. daily Will defer BP management to PCP  Mixed hyperlipidemia Currently on Zetia  10 mg p.o. daily. Lipids currently being managed by PCP.  Orders Placed:  Orders Placed This Encounter  Procedures   EKG 12-Lead     Final Medication List:   No orders of the defined types were placed in this encounter.   There are no discontinued medications.   Current Outpatient Medications:    ezetimibe  (ZETIA ) 10 MG tablet, Take 1 tablet (10 mg total) by mouth daily., Disp: 90 tablet, Rfl: 3   triamterene -hydrochlorothiazide (MAXZIDE-25) 37.5-25 MG tablet, Take 1 tablet by mouth daily., Disp: 90 tablet, Rfl: 3  Consent:   NA  Disposition:   Recommended follow-up as needed.  However, patient states that she wants to be seen annually.  Her questions and concerns were addressed to her satisfaction. She voices understanding of the recommendations provided during this encounter.    Signed, Madonna Michele HAS, [facility]  A Division of [facility] [address] [place], [place] [address]  08/13/2024 8:56 AM "

## 2024-09-09 ENCOUNTER — Other Ambulatory Visit: Payer: Self-pay | Admitting: Internal Medicine

## 2024-09-09 DIAGNOSIS — Z1231 Encounter for screening mammogram for malignant neoplasm of breast: Secondary | ICD-10-CM

## 2024-10-23 ENCOUNTER — Ambulatory Visit

## 2024-12-03 ENCOUNTER — Ambulatory Visit: Admitting: Internal Medicine

## 2025-05-05 ENCOUNTER — Ambulatory Visit
